# Patient Record
Sex: Female | Born: 1965 | Race: White | Hispanic: No | Smoking: Never smoker
Health system: Southern US, Community
[De-identification: ages and names within clinical notes are randomized; demographics above are authoritative.]

## PROBLEM LIST (undated history)

## (undated) DIAGNOSIS — I4892 Unspecified atrial flutter: Secondary | ICD-10-CM

## (undated) DIAGNOSIS — I4719 Other supraventricular tachycardia: Secondary | ICD-10-CM

## (undated) DIAGNOSIS — R739 Hyperglycemia, unspecified: Secondary | ICD-10-CM

## (undated) DIAGNOSIS — R079 Chest pain, unspecified: Secondary | ICD-10-CM

## (undated) DIAGNOSIS — R911 Solitary pulmonary nodule: Secondary | ICD-10-CM

## (undated) DIAGNOSIS — I499 Cardiac arrhythmia, unspecified: Secondary | ICD-10-CM

## (undated) DIAGNOSIS — I471 Supraventricular tachycardia: Secondary | ICD-10-CM

## (undated) DIAGNOSIS — E785 Hyperlipidemia, unspecified: Secondary | ICD-10-CM

## (undated) DIAGNOSIS — I491 Atrial premature depolarization: Secondary | ICD-10-CM

## (undated) DIAGNOSIS — I480 Paroxysmal atrial fibrillation: Secondary | ICD-10-CM

## (undated) HISTORY — DX: Hyperlipidemia, unspecified: E78.5

## (undated) HISTORY — PX: HERNIA REPAIR: SHX51

## (undated) HISTORY — PX: CHOLECYSTECTOMY: SHX55

## (undated) HISTORY — PX: APPENDECTOMY: SHX54

---

## 1998-03-10 ENCOUNTER — Other Ambulatory Visit: Admission: RE | Admit: 1998-03-10 | Discharge: 1998-03-10 | Payer: Self-pay | Admitting: Obstetrics and Gynecology

## 1998-05-29 ENCOUNTER — Other Ambulatory Visit: Admission: RE | Admit: 1998-05-29 | Discharge: 1998-05-29 | Payer: Self-pay | Admitting: Obstetrics and Gynecology

## 1999-03-05 ENCOUNTER — Other Ambulatory Visit: Admission: RE | Admit: 1999-03-05 | Discharge: 1999-03-05 | Payer: Self-pay | Admitting: Obstetrics and Gynecology

## 2000-04-29 ENCOUNTER — Inpatient Hospital Stay (HOSPITAL_COMMUNITY): Admission: AD | Admit: 2000-04-29 | Discharge: 2000-05-02 | Payer: Self-pay | Admitting: Obstetrics and Gynecology

## 2000-06-01 ENCOUNTER — Other Ambulatory Visit: Admission: RE | Admit: 2000-06-01 | Discharge: 2000-06-01 | Payer: Self-pay | Admitting: Obstetrics and Gynecology

## 2001-12-09 ENCOUNTER — Inpatient Hospital Stay (HOSPITAL_COMMUNITY): Admission: AD | Admit: 2001-12-09 | Discharge: 2001-12-09 | Payer: Self-pay | Admitting: Obstetrics and Gynecology

## 2001-12-27 ENCOUNTER — Inpatient Hospital Stay (HOSPITAL_COMMUNITY): Admission: AD | Admit: 2001-12-27 | Discharge: 2001-12-30 | Payer: Self-pay | Admitting: Obstetrics & Gynecology

## 2002-02-11 ENCOUNTER — Other Ambulatory Visit: Admission: RE | Admit: 2002-02-11 | Discharge: 2002-02-11 | Payer: Self-pay | Admitting: Obstetrics & Gynecology

## 2003-05-27 ENCOUNTER — Other Ambulatory Visit: Admission: RE | Admit: 2003-05-27 | Discharge: 2003-05-27 | Payer: Self-pay | Admitting: Obstetrics & Gynecology

## 2004-06-07 ENCOUNTER — Other Ambulatory Visit: Admission: RE | Admit: 2004-06-07 | Discharge: 2004-06-07 | Payer: Self-pay | Admitting: Obstetrics & Gynecology

## 2012-09-18 ENCOUNTER — Encounter: Payer: Self-pay | Admitting: Gastroenterology

## 2012-10-02 ENCOUNTER — Encounter: Payer: Self-pay | Admitting: Gastroenterology

## 2012-10-02 ENCOUNTER — Ambulatory Visit (INDEPENDENT_AMBULATORY_CARE_PROVIDER_SITE_OTHER): Payer: BC Managed Care – PPO | Admitting: Gastroenterology

## 2012-10-02 VITALS — BP 118/82 | HR 83 | Ht 65.5 in | Wt 246.0 lb

## 2012-10-02 DIAGNOSIS — Z800 Family history of malignant neoplasm of digestive organs: Secondary | ICD-10-CM

## 2012-10-02 MED ORDER — MOVIPREP 100 G PO SOLR: 1.0000 | Freq: Once | ORAL | Status: DC

## 2012-10-02 NOTE — Patient Instructions (Addendum)
You will be set up for a colonoscopy (LEC, moderate sedation) for FH of colon cancer.

## 2012-10-02 NOTE — Progress Notes (Signed)
HPI: This is a  very pleasant 47 year old woman whom I am meeting for the first time today.  Her mother was diagnosed with colon cancer at age 25.  Died quickly.  Constipation her whole life, improving lately.  Put on 80 pounds in 1.5 years.  She has had no overt GI bleeding.  Review of systems: Pertinent positive and negative review of systems were noted in the above HPI section. Complete review of systems was performed and was otherwise normal.    Past Medical History  Diagnosis Date  . Morbid obesity   . Palpitations   . Unspecified sleep apnea   . Hyperlipidemia   . Anxiety   . Sleep apnea   . Gall stones     Past Surgical History  Procedure Laterality Date  . Appendectomy    . Cholecystectomy    . Cholecystectomy    . Hernia repair      Current Outpatient Prescriptions  Medication Sig Dispense Refill  . EVENING PRIMROSE OIL PO Take 1 capsule by mouth daily.      . fish oil-omega-3 fatty acids 1000 MG capsule Take 2 g by mouth daily.       No current facility-administered medications for this visit.    Allergies as of 10/02/2012  . (Not on File)    Family History  Problem Relation Age of Onset  . Heart disease Father   . Colon cancer Mother   . Stroke Paternal Grandfather   . Rectal cancer Mother   . Breast cancer Sister   . Colon polyps Sister     x 2  . Stroke Paternal Grandmother   . Diabetes Paternal Grandfather   . Diabetes Paternal Grandmother     History   Social History  . Marital Status: Married    Spouse Name: N/A    Number of Children: 2  . Years of Education: N/A   Occupational History  .     Social History Main Topics  . Smoking status: Never Smoker   . Smokeless tobacco: Never Used  . Alcohol Use: Yes     Comment: rare  . Drug Use: No  . Sexually Active: Not on file   Other Topics Concern  . Not on file   Social History Narrative  . No narrative on file       Physical Exam: BP 118/82  Pulse 83  Ht 5' 5.5"  (1.664 m)  Wt 246 lb (111.585 kg)  BMI 40.3 kg/m2  LMP 10/01/2012 Constitutional: generally well-appearing Psychiatric: alert and oriented x3 Eyes: extraocular movements intact Mouth: oral pharynx moist, no lesions Neck: supple no lymphadenopathy Cardiovascular: heart regular rate and rhythm Lungs: clear to auscultation bilaterally Abdomen: soft, nontender, nondistended, no obvious ascites, no peritoneal signs, normal bowel sounds Extremities: no lower extremity edema bilaterally Skin: no lesions on visible extremities    Assessment and plan: 47 y.o. female with  significant family history of colon cancer, her mother had colon cancer in her 75s  She is at elevated risk for colon cancer and needs colon cancer screening with colonoscopy at her soonest convenience. She understands that even if it is normal, no polyps or cancers, she should probably have repeat colon cancer screening with a colonoscopy 5 years from now as well.

## 2012-10-31 ENCOUNTER — Telehealth: Payer: Self-pay | Admitting: Gastroenterology

## 2012-10-31 ENCOUNTER — Other Ambulatory Visit: Payer: BC Managed Care – PPO | Admitting: Gastroenterology

## 2012-11-01 NOTE — Telephone Encounter (Signed)
Please bill.

## 2012-11-01 NOTE — Telephone Encounter (Signed)
She no showed for her colonoscopy.  Should charge our fee.

## 2012-11-15 ENCOUNTER — Institutional Professional Consult (permissible substitution): Payer: Self-pay | Admitting: Cardiovascular Disease

## 2013-03-26 ENCOUNTER — Encounter (HOSPITAL_COMMUNITY): Payer: Self-pay | Admitting: *Deleted

## 2013-03-26 ENCOUNTER — Inpatient Hospital Stay (HOSPITAL_COMMUNITY)
Admission: AD | Admit: 2013-03-26 | Discharge: 2013-03-29 | DRG: 287 | Disposition: A | Discharge status: Home / Self Care | Payer: BC Managed Care – PPO | Source: Other Acute Inpatient Hospital | Attending: Cardiology | Admitting: Cardiology

## 2013-03-26 DIAGNOSIS — Z800 Family history of malignant neoplasm of digestive organs: Secondary | ICD-10-CM

## 2013-03-26 DIAGNOSIS — Z6833 Body mass index (BMI) 33.0-33.9, adult: Secondary | ICD-10-CM

## 2013-03-26 DIAGNOSIS — Z23 Encounter for immunization: Secondary | ICD-10-CM

## 2013-03-26 DIAGNOSIS — I498 Other specified cardiac arrhythmias: Secondary | ICD-10-CM | POA: Diagnosis present

## 2013-03-26 DIAGNOSIS — Z9089 Acquired absence of other organs: Secondary | ICD-10-CM

## 2013-03-26 DIAGNOSIS — Z8371 Family history of colonic polyps: Secondary | ICD-10-CM

## 2013-03-26 DIAGNOSIS — I369 Nonrheumatic tricuspid valve disorder, unspecified: Secondary | ICD-10-CM

## 2013-03-26 DIAGNOSIS — R079 Chest pain, unspecified: Secondary | ICD-10-CM

## 2013-03-26 DIAGNOSIS — Z823 Family history of stroke: Secondary | ICD-10-CM

## 2013-03-26 DIAGNOSIS — I480 Paroxysmal atrial fibrillation: Secondary | ICD-10-CM | POA: Diagnosis present

## 2013-03-26 DIAGNOSIS — I491 Atrial premature depolarization: Secondary | ICD-10-CM | POA: Diagnosis present

## 2013-03-26 DIAGNOSIS — R739 Hyperglycemia, unspecified: Secondary | ICD-10-CM | POA: Diagnosis present

## 2013-03-26 DIAGNOSIS — Z833 Family history of diabetes mellitus: Secondary | ICD-10-CM

## 2013-03-26 DIAGNOSIS — H538 Other visual disturbances: Secondary | ICD-10-CM | POA: Diagnosis present

## 2013-03-26 DIAGNOSIS — I4891 Unspecified atrial fibrillation: Principal | ICD-10-CM | POA: Diagnosis present

## 2013-03-26 DIAGNOSIS — R001 Bradycardia, unspecified: Secondary | ICD-10-CM | POA: Diagnosis present

## 2013-03-26 DIAGNOSIS — Z803 Family history of malignant neoplasm of breast: Secondary | ICD-10-CM

## 2013-03-26 DIAGNOSIS — F411 Generalized anxiety disorder: Secondary | ICD-10-CM | POA: Diagnosis present

## 2013-03-26 DIAGNOSIS — E669 Obesity, unspecified: Secondary | ICD-10-CM | POA: Diagnosis present

## 2013-03-26 DIAGNOSIS — Z79899 Other long term (current) drug therapy: Secondary | ICD-10-CM

## 2013-03-26 DIAGNOSIS — R7309 Other abnormal glucose: Secondary | ICD-10-CM | POA: Diagnosis present

## 2013-03-26 DIAGNOSIS — R911 Solitary pulmonary nodule: Secondary | ICD-10-CM | POA: Diagnosis present

## 2013-03-26 DIAGNOSIS — E785 Hyperlipidemia, unspecified: Secondary | ICD-10-CM | POA: Diagnosis present

## 2013-03-26 DIAGNOSIS — Z83719 Family history of colon polyps, unspecified: Secondary | ICD-10-CM

## 2013-03-26 DIAGNOSIS — I471 Supraventricular tachycardia, unspecified: Secondary | ICD-10-CM | POA: Diagnosis not present

## 2013-03-26 DIAGNOSIS — I4892 Unspecified atrial flutter: Secondary | ICD-10-CM | POA: Diagnosis present

## 2013-03-26 DIAGNOSIS — R0789 Other chest pain: Secondary | ICD-10-CM | POA: Diagnosis present

## 2013-03-26 DIAGNOSIS — Z8249 Family history of ischemic heart disease and other diseases of the circulatory system: Secondary | ICD-10-CM

## 2013-03-26 HISTORY — DX: Atrial premature depolarization: I49.1

## 2013-03-26 HISTORY — DX: Hyperglycemia, unspecified: R73.9

## 2013-03-26 HISTORY — DX: Paroxysmal atrial fibrillation: I48.0

## 2013-03-26 HISTORY — DX: Unspecified atrial flutter: I48.92

## 2013-03-26 HISTORY — DX: Chest pain, unspecified: R07.9

## 2013-03-26 HISTORY — DX: Other supraventricular tachycardia: I47.19

## 2013-03-26 HISTORY — DX: Solitary pulmonary nodule: R91.1

## 2013-03-26 HISTORY — DX: Obesity, unspecified: E66.9

## 2013-03-26 HISTORY — DX: Supraventricular tachycardia: I47.1

## 2013-03-26 LAB — HEPARIN LEVEL (UNFRACTIONATED): Heparin Unfractionated: 0.33 IU/mL (ref 0.30–0.70)

## 2013-03-26 LAB — BASIC METABOLIC PANEL
BUN: 10 mg/dL (ref 6–23)
CO2: 25 mEq/L (ref 19–32)
Calcium: 8.9 mg/dL (ref 8.4–10.5)
Chloride: 106 mEq/L (ref 96–112)
GFR calc non Af Amer: 76 mL/min — ABNORMAL LOW (ref >90)
Glucose, Bld: 91 mg/dL (ref 70–99)
Sodium: 139 mEq/L (ref 135–145)

## 2013-03-26 LAB — HEMOGLOBIN A1C: Mean Plasma Glucose: 120 mg/dL — ABNORMAL HIGH (ref <117)

## 2013-03-26 LAB — MAGNESIUM: Magnesium: 2.1 mg/dL (ref 1.5–2.5)

## 2013-03-26 LAB — TROPONIN I: Troponin I: <0.3 ng/mL (ref <0.30)

## 2013-03-26 MED ORDER — HEPARIN (PORCINE) IN NACL 100-0.45 UNIT/ML-% IJ SOLN
1400.0000 [IU]/h | INTRAMUSCULAR | Status: DC
  Administered 2013-03-26: 1100 [IU]/h via INTRAVENOUS
  Administered 2013-03-27 – 2013-03-28 (×3): 1400 [IU]/h via INTRAVENOUS
  Filled 2013-03-26 (×6): qty 250

## 2013-03-26 MED ORDER — ASPIRIN EC 81 MG PO TBEC
81.0000 mg | DELAYED_RELEASE_TABLET | Freq: Every day | ORAL | Status: DC
  Administered 2013-03-26 – 2013-03-29 (×4): 81 mg via ORAL
  Filled 2013-03-26 (×4): qty 1

## 2013-03-26 MED ORDER — NITROGLYCERIN 0.4 MG SL SUBL
0.4000 mg | SUBLINGUAL_TABLET | SUBLINGUAL | Status: DC | PRN
  Filled 2013-03-26: qty 25

## 2013-03-26 MED ORDER — SODIUM CHLORIDE 0.9 % IV SOLN: 250.0000 mL | INTRAVENOUS | Status: DC | PRN

## 2013-03-26 MED ORDER — HEPARIN BOLUS VIA INFUSION
4000.0000 [IU] | Freq: Once | INTRAVENOUS | Status: AC
  Administered 2013-03-26: 4000 [IU] via INTRAVENOUS
  Filled 2013-03-26: qty 4000

## 2013-03-26 MED ORDER — METOPROLOL TARTRATE 25 MG PO TABS
25.0000 mg | ORAL_TABLET | Freq: Two times a day (BID) | ORAL | Status: DC
Start: 2013-03-26 — End: 2013-03-26

## 2013-03-26 MED ORDER — PANTOPRAZOLE SODIUM 40 MG PO TBEC
40.0000 mg | DELAYED_RELEASE_TABLET | Freq: Every day | ORAL | Status: DC
  Administered 2013-03-26 – 2013-03-29 (×4): 40 mg via ORAL
  Filled 2013-03-26 (×4): qty 1

## 2013-03-26 MED ORDER — ONDANSETRON HCL 4 MG/2ML IJ SOLN
4.0000 mg | Freq: Four times a day (QID) | INTRAMUSCULAR | Status: DC | PRN
  Administered 2013-03-27: 4 mg via INTRAVENOUS
  Filled 2013-03-26: qty 2

## 2013-03-26 MED ORDER — SODIUM CHLORIDE 0.9 % IJ SOLN
3.0000 mL | Freq: Two times a day (BID) | INTRAMUSCULAR | Status: DC
  Administered 2013-03-26 – 2013-03-28 (×4): 3 mL via INTRAVENOUS

## 2013-03-26 MED ORDER — METOPROLOL SUCCINATE ER 25 MG PO TB24: 25.0000 mg | ORAL_TABLET | Freq: Two times a day (BID) | ORAL | Status: DC

## 2013-03-26 MED ORDER — ACETAMINOPHEN 325 MG PO TABS: 650.0000 mg | ORAL_TABLET | ORAL | Status: DC | PRN

## 2013-03-26 MED ORDER — SODIUM CHLORIDE 0.9 % IJ SOLN: 3.0000 mL | INTRAMUSCULAR | Status: DC | PRN

## 2013-03-26 MED ORDER — METOPROLOL SUCCINATE ER 25 MG PO TB24
25.0000 mg | ORAL_TABLET | Freq: Two times a day (BID) | ORAL | Status: DC
  Administered 2013-03-26 (×2): 25 mg via ORAL
  Filled 2013-03-26 (×5): qty 1

## 2013-03-26 NOTE — Progress Notes (Signed)
ANTICOAGULATION CONSULT NOTE - Follow Up Consult  Pharmacy Consult for heparin Indication: chest pain/ACS  No Known Allergies  Patient Measurements: Height: 5\' 5"  (165.1 cm) Weight: 200 lb (90.719 kg) IBW/kg (Calculated) : 57 Heparin Dosing Weight: 80 kg  Vital Signs: Temp: 98.1 F (36.7 C) (12/09 1619) Temp src: Oral (12/09 1619) BP: 93/33 mmHg (12/09 1619) Pulse Rate: 54 (12/09 1619)  Labs:  Recent Labs  03/26/13 1315  CREATININE 0.89  TROPONINI <0.30    Estimated Creatinine Clearance: 87 ml/min (by C-G formula based on Cr of 0.89).   Medications:  Scheduled:  . aspirin EC  81 mg Oral Daily  . metoprolol succinate  25 mg Oral BID  . pantoprazole  40 mg Oral Daily  . sodium chloride  3 mL Intravenous Q12H   Infusions:  . heparin 1,100 Units/hr (03/26/13 1432)    Assessment: 47 yo female with ACS is currently on therapeutic heparin.  Heparin level is 0.33. Goal of Therapy:  Heparin level 0.3-0.7 units/ml Monitor platelets by anticoagulation protocol: Yes   Plan:  1) Continue heparin at 1100 units/hr. Reconfirm with am lab. 2) Daily heparin level and CBC  Reneta Niehaus, Tsz-Yin 03/26/2013,7:08 PM

## 2013-03-26 NOTE — Progress Notes (Signed)
Echocardiogram 2D Echocardiogram has been performed.  Sandra Stark 03/26/2013, 2:44 PM

## 2013-03-26 NOTE — Progress Notes (Signed)
ANTICOAGULATION CONSULT NOTE - Initial Consult  Pharmacy Consult for Heparin Indication: chest pain/ACS  No Known Allergies  Patient Measurements: Height: 5\' 5"  (165.1 cm) Weight: 200 lb (90.719 kg) IBW/kg (Calculated) : 57 Heparin Dosing Weight: ~ 80 kg  Vital Signs: Temp: 97.8 F (36.6 C) (12/09 1057) Temp src: Oral (12/09 1057) BP: 131/56 mmHg (12/09 1200) Pulse Rate: 82 (12/09 1200)  Labs: No results found for this basename: HGB, HCT, PLT, APTT, LABPROT, INR, HEPARINUNFRC, CREATININE, CKTOTAL, CKMB, TROPONINI,  in the last 72 hours  CrCl is unknown because no creatinine reading has been taken.   Medical History: Past Medical History  Diagnosis Date  . Palpitations     a. Reported to PCP 09/2012 but ECG reportedly OK.  Marland Kitchen Hyperlipidemia     a. Monitored by PCP - elected to pursue lifestyle modification in 09/2012 with reassessment pending.    Medications:  Scheduled:  . aspirin EC  81 mg Oral Daily  . metoprolol succinate  25 mg Oral BID  . pantoprazole  40 mg Oral Daily  . sodium chloride  3 mL Intravenous Q12H    Assessment: 47 yo female admitted with CP, palpitations, and atrial flutter.  Pharmacy asked to begin anticoagulation with IV heparin.  Spoke with patient, no history of bleeding or blood thinners PTA.  Labs from [place]: PT/INR 10.2/1; PTT 22.9; WBC 10.4, Hgb 13.3, Hct 38.5; Pltc 198; Scr 0.9.  Goal of Therapy:  Heparin level 0.3-0.7 units/ml Monitor platelets by anticoagulation protocol: Yes   Plan:  1. Give IV heparin 4000 units x 1 now. 2. Then start heparin gtt at 1100 units/hr. 3. Check heparin level 6 hrs after gtt starts. 4. Daily heparin level and CBC.  Tad Moore, BCPS  Clinical Pharmacist Pager [phone]  03/26/2013 1:52 PM

## 2013-03-26 NOTE — H&P (Signed)
History and Physical  Patient ID: Sandra Stark MRN: [id number], DOB: 06-09-1965 Date of Encounter: 03/26/2013, 12:41 PM Primary Physician: Feliciana Rossetti, MD Primary Cardiologist:  New to [facility] (sister-in-law sees Dr. Gala Romney)  Chief Complaint: CP, palpitations Reason for Admission: CP, ectopy, atrial flutter  HPI: Sandra Stark is a 47 y/o F with history of hyperlipidemia & palpitations but no formal cardiac diagnoses who presented to [facility] today with CP and palpitations. She saw her PCP in 09/2012 c/o rare palpitations - she states an EKG was done that did not show anything, but a lab level showed she might have mild fluid from her heart so they were going to keep an eye on it. Her cholesterol was also reportedly elevated but she elected a trial of lifestyle modifications prior to statin initiation. She denies prior echo or cath. For the past 2-3 days she has not felt well. She has felt more fatigued with intermittent palpitations. She initially attributed this to being busy with work, but yesterday evening developed CP. This began around 10:30pm at a 3/10 as a tightness in her chest with intermittent radiation to her back. She also felt right arm soreness and heaviness. She had occasional palpitations that were short lived, but other episodes that were somewhat longer. With the longer episodes she felt a sense of anxiety and tingling all over. When the palpitations occurred, she would feel an increase in chest heaviness. Otherwise nothing made the pain worse. She also has had constant chest pain in absence of palpitations. She endorses nausea and sensation of not being able to take a deep breath. No vomiting, syncope, LEE, orthopnea. Due to persistence of symptoms she called 911. EMS strips show NSR with frequent PACs with abberrancy (vs PVCs). Per ER notes, EMS also reportedly saw SVT. While in the ER the patient developed tachycardia with HR into the 150's and EKG showed what appears to be  atrial flutter. The patient states she received 2 injections - the first vial did nothing, the 2nd vial bottomed HR out transiently to 38-40 per her report. The ED notes indicate it was Lopressor that she received. On arrival to [facility] she still feels the 3/10 discomfort and is having occasional ectopy/bigeminy on telemetry. She also received 3 SL NTG for her CP - the first made her feel "horrible with increased CP to 7/10"; subsequent NTGs brought her pain back down to baseline level of 3/10. She also reports intermittent blurry/fuzzy vision for the last several days, lasting minutes at a time several times a day - no visual field loss, spots/flashes of light, facial asymmetry, numbness, weakness, slurred speech, or any other focal neurologic abnormalities except a sense that she just hasn't been able to focus this last few days. She does not exercise but denies any recent exertional CP or SOB.  At [facility], she also received 324mg  ASA, morphine, and Zofran. Troponins at 0100 and 0320 were normal, d-dimer/pBNP/TSH/free T4 all normal. CT angio showed no evidence of PE; thoracic aorta was WNL; did show 6mm RML nodule. Labs otherwise unremarkable except glucose 110. VSS.  Past Medical History  Diagnosis Date  . Palpitations     a. Reported to PCP 09/2012 but ECG reportedly OK.  Marland Kitchen Hyperlipidemia     a. Monitored by PCP - elected to pursue lifestyle modification in 09/2012 with reassessment pending.     Most Recent Cardiac Studies: None   Surgical History:  Past Surgical History  Procedure Laterality Date  . Appendectomy    .  Cholecystectomy    . Hernia repair       Home Meds: Prior to Admission medications   Medication Sig Start Date End Date Taking? Authorizing Provider  EVENING PRIMROSE OIL PO Take 1 capsule by mouth daily.   Yes Historical Provider, MD  fish oil-omega-3 fatty acids 1000 MG capsule Take 2 g by mouth daily.   Yes Historical Provider, MD    Allergies: No Known  Allergies  History   Social History  . Marital Status: Married    Spouse Name: N/A    Number of Children: 2  . Years of Education: N/A   Occupational History  .      Adminstrative assistance   Social History Main Topics  . Smoking status: Never Smoker   . Smokeless tobacco: Never Used  . Alcohol Use: Yes     Comment: rare - 2x/month  . Drug Use: No  . Sexual Activity: Yes   Other Topics Concern  . Not on file   Social History Narrative  . No narrative on file     Family History  Problem Relation Age of Onset  . Heart disease      several family members on dad's side have had heart issues - earliest in their 10's  . Colon cancer Mother   . Stroke Paternal Grandfather   . Rectal cancer Mother   . Breast cancer Sister   . Colon polyps Sister     x 2  . Stroke Paternal Grandmother   . Diabetes Paternal Grandfather   . Diabetes Paternal Grandmother     Review of Systems: General: negative for chills, fever, night sweats or weight changes.  Cardiovascular: see above Dermatological: negative for rash Respiratory: negative for cough or wheezing Urologic: negative for hematuria Abdominal: negative for vomiting, diarrhea, bright red blood per rectum, melena, or hematemesis Neurologic: negative for syncope or dizziness All other systems reviewed and are otherwise negative except as noted above.  Labs:  WBC 10.4, Hgb 13.3, Hct 38.5, Plt 198 Na 137, K 3.7, Cl 102, CO2 22, BUN 14, Cr 0.90, glu 110 Ca 8.7, albumin 4,0, Tprot 7.2,  PT 10.2, INR 1.0, APTT 22.9 Ddimer WNL <100 (ref range <400) CK/troponin/myoglobin WNL x 2 (0100, 0340) pBNP 69 (wnl) Tbili 0.7, AST 29, ALT 25, AP 91 TSH 4.55, free T4 1.09, free T3 4.23 (all wnl) Urine Hcg neg  Radiology/Studies:  CXR: no acute cardiopulmonary process seen CT angio: no PE nor acute CP process. Thoracic aorta is normal in course and caliber, unremarkable. No pneuomthorax. thracheobronchial tree is patent and midline.  3mm RML pulm nodule- if high risk for bronchogenic carcinoma, f/u CT 1 yr. If low risk, no f/u needed.    EKG:  03/26/13 0:52: NSR 92 bpm occasional PACs with aberrancy no acute ST-T changes (some tracings show bigeminy) 03/26/13 3:18 - atrial flutter RVR 154bpm with nonspecific STT changes 03/26/13 3:21: NSR 82bpm with ? Possible extremely minimal ST depression II, V5-V6  Physical Exam: Blood pressure 131/56, pulse 82, temperature 97.8 F (36.6 C), temperature source Oral, resp. rate 15, height 5\' 5"  (1.651 m), weight 200 lb (90.719 kg), last menstrual period 03/02/2013, SpO2 100.00%. General: Well developed, well nourished overweight WF in no acute distress. Head: Normocephalic, atraumatic, sclera non-icteric, no xanthomas, nares are without discharge.  Neck: Negative for carotid bruits. JVD not elevated. Lungs: Clear bilaterally to auscultation without wheezes, rales, or rhonchi. Breathing is unlabored. Heart: RRR with S1 S2. No murmurs, rubs, or gallops appreciated.  Abdomen: Soft, non-tender, non-distended with normoactive bowel sounds. No hepatomegaly. No rebound/guarding. No obvious abdominal masses. Msk:  Strength and tone appear normal for age. Extremities: No clubbing or cyanosis. No edema.  Distal pedal pulses are 2+ and equal bilaterally. Neuro: Alert and oriented X 3. No focal deficit. No facial asymmetry. Moves all extremities spontaneously. Psych:  Responds to questions appropriately with a normal affect.    ASSESSMENT AND PLAN:  1. Chest pain - occurs even in absence of palpitations, with typical/atypical features. Negative enzymes at [place]. No PE or thoracic dissection by CTA. Will admit, cycle enzymes, heparin per pharmacy for now given atrial flutter and CP until she rules out. Nuc tomorrow if rules out. Add PPI. Continue ASA. Add BB as below. 2. Palpitations due to atrial flutter and atrial ectopy - she has had intermittent palpitations since the summer. See below for  comprehensive thoughts. Check 2D echo. Add Toprol XL 25mg  BID. TFTs wnl at [place]. 3. Blurry vision, intermittent - at this time we wonder if BP is dropping due to paroxysms of arrhythmia. Neuro exam nonfocal. If this persists, would recommend neuro eval.  4. Hyperlipidemia - check lipids in AM and add statin if appropriate.  5. Obesity BMI 33.3 - would encourage healthy diet/lifestyle. 6. Mild hyperglycemia (glu 110) - check A1C. 7. RML nodule - f/u PCP as outpatient.  Signed, Ronie Spies PA-C 03/26/2013, 12:41 PM  Patient seen with PA, agree with the above note.   1. Palpitations: Patient has had periods of heart racing since this summer. They have been worse over the last 2 wks (more often, lasting longer).  Chest pain and blurry vision have been associated with episodes of heart racing. In the ER today at [facility], she was noted to have atrial flutter (somewhat atypical-appearing flutter waves - ECG in shadow chart) with rate in the 150s.  She also had bigeminal premature beats, suspect PACs with aberrancy.  I think that her symptoms likely correlate with atrial flutter episodes.  She is now back in NSR. TSH and BNP normal at [place].  - Will start Toprol XL 25 mg bid - Needs echo.  - Will have EP see patient, suspect flutter ablation would be beneficial.  2. Chest pain: On and off x 1 week.  CTA chest negative for PE or dissection.  1st set cardiac enzymes at [facility] negative.  Still with 3/10 chest pain. ECG currently non-acute.  Prior chest pain episodes associated with palpitations.  Will rule out MI with serial troponins.  She will get ASA and will be on heparin gtt for now.  If she rules out, would get Cardiolite tomorrow morning.    3. Blurry vision episodes: Not really focal.  Possible that this is related to a fall in BP with atrial flutter/RVR episodes.   Marca Ancona 03/26/2013 1:36 PM

## 2013-03-27 ENCOUNTER — Observation Stay (HOSPITAL_COMMUNITY): Payer: BC Managed Care – PPO

## 2013-03-27 DIAGNOSIS — R079 Chest pain, unspecified: Secondary | ICD-10-CM

## 2013-03-27 LAB — BASIC METABOLIC PANEL WITH GFR
BUN: 10 mg/dL (ref 6–23)
CO2: 26 meq/L (ref 19–32)
Calcium: 8.4 mg/dL (ref 8.4–10.5)
Chloride: 104 meq/L (ref 96–112)
Creatinine, Ser: 1.05 mg/dL (ref 0.50–1.10)
GFR calc Af Amer: 72 mL/min — ABNORMAL LOW
GFR calc non Af Amer: 62 mL/min — ABNORMAL LOW
Glucose, Bld: 89 mg/dL (ref 70–99)
Potassium: 4.1 meq/L (ref 3.5–5.1)
Sodium: 139 meq/L (ref 135–145)

## 2013-03-27 LAB — LIPID PANEL
Cholesterol: 169 mg/dL (ref 0–200)
HDL: 35 mg/dL — ABNORMAL LOW (ref >39)
LDL Cholesterol: 98 mg/dL (ref 0–99)
Triglycerides: 178 mg/dL — ABNORMAL HIGH (ref <150)
VLDL: 36 mg/dL (ref 0–40)

## 2013-03-27 LAB — CBC
HCT: 35.7 % — ABNORMAL LOW (ref 36.0–46.0)
MCV: 93.5 fL (ref 78.0–100.0)
Platelets: 192 10*3/uL (ref 150–400)
RBC: 3.82 MIL/uL — ABNORMAL LOW (ref 3.87–5.11)
WBC: 8.8 10*3/uL (ref 4.0–10.5)

## 2013-03-27 LAB — HEPARIN LEVEL (UNFRACTIONATED)
Heparin Unfractionated: 0.17 [IU]/mL — ABNORMAL LOW (ref 0.30–0.70)
Heparin Unfractionated: 0.33 IU/mL (ref 0.30–0.70)

## 2013-03-27 MED ORDER — HEPARIN BOLUS VIA INFUSION
2000.0000 [IU] | Freq: Once | INTRAVENOUS | Status: AC
  Administered 2013-03-27: 2000 [IU] via INTRAVENOUS
  Filled 2013-03-27: qty 2000

## 2013-03-27 MED ORDER — TECHNETIUM TC 99M SESTAMIBI GENERIC - CARDIOLITE
30.0000 | Freq: Once | INTRAVENOUS | Status: AC | PRN
  Administered 2013-03-27: 30 via INTRAVENOUS

## 2013-03-27 MED ORDER — ATROPINE SULFATE 1 MG/ML IJ SOLN
INTRAMUSCULAR | Status: AC
  Filled 2013-03-27: qty 1

## 2013-03-27 MED ORDER — REGADENOSON 0.4 MG/5ML IV SOLN
INTRAVENOUS | Status: AC
  Filled 2013-03-27: qty 5

## 2013-03-27 MED ORDER — ASPIRIN 81 MG PO CHEW
81.0000 mg | CHEWABLE_TABLET | ORAL | Status: AC
  Administered 2013-03-28: 81 mg via ORAL
  Filled 2013-03-27: qty 1

## 2013-03-27 MED ORDER — INFLUENZA VAC SPLIT QUAD 0.5 ML IM SUSP
0.5000 mL | INTRAMUSCULAR | Status: DC
  Filled 2013-03-27: qty 0.5

## 2013-03-27 MED ORDER — TECHNETIUM TC 99M SESTAMIBI GENERIC - CARDIOLITE
10.0000 | Freq: Once | INTRAVENOUS | Status: AC | PRN
  Administered 2013-03-27: 10 via INTRAVENOUS

## 2013-03-27 MED ORDER — ATORVASTATIN CALCIUM 40 MG PO TABS
40.0000 mg | ORAL_TABLET | Freq: Every day | ORAL | Status: DC
  Administered 2013-03-27 – 2013-03-28 (×2): 40 mg via ORAL
  Filled 2013-03-27 (×4): qty 1

## 2013-03-27 MED ORDER — SODIUM CHLORIDE 0.9 % IV SOLN
1.0000 mL/kg/h | INTRAVENOUS | Status: DC
  Administered 2013-03-28: 1 mL/kg/h via INTRAVENOUS

## 2013-03-27 NOTE — Progress Notes (Signed)
Nuc results noted. D/w Dr. Ladona Ridgel who is on his way to talk to the patient. LHC scheduled for tomorrow per our discussion. Dayna Dunn PA-C

## 2013-03-27 NOTE — Progress Notes (Signed)
ANTICOAGULATION CONSULT NOTE - Follow Up Consult  Pharmacy Consult for heparin Indication: chest pain/ACS  Labs:  Recent Labs  03/26/13 1315 03/26/13 2000 03/26/13 2024 03/27/13 0033 03/27/13 0336  HGB  --   --   --   --  12.2  HCT  --   --   --   --  35.7*  PLT  --   --   --   --  192  HEPARINUNFRC  --   --  0.33  --  0.17*  CREATININE 0.89  --   --   --  1.05  TROPONINI <0.30 <0.30  --  <0.30  --     Assessment: 47yo female now subtherapeutic on heparin after one level at low end of goal, no gtt issues overnight.  Goal of Therapy:  Heparin level 0.3-0.7 units/ml   Plan:  Will rebolus with heparin 2000 units x1 and increase gtt by 3 units/kg/hr to 1400 units/hr and check level in 6hr.  Vernard Gambles, PharmD, BCPS  03/27/2013,5:32 AM

## 2013-03-27 NOTE — Progress Notes (Signed)
Stress cardiolite performed. Pt's HR was initially in the low-mid 40's while down in nuc. BP was stable. She was c/o the same low grade chest pressure she's felt since admission but no acute evolving sx. She did get Toprol XL yesterday afternoon and evening but today's dose was held. With sitting up, HR went to the 80s and she was able to exercise with good exercise tolerance on the treadmill. Chest pressure went away with midlevel exercise and returned at the very peak of exercise. Reached target HR of 147, peak 150. No arrhythmias with exercise or acute EKG changes with exercise. About 4 minutes into recovery she had brief 4-7 beat runs of nonsustained atrial tachycardia along with PACs and blocked PACs in bigeminal pattern. HR varied from 40s-100s. She did feel palpitations and the same low grade chest pressure from admission. Enzymes negative thus far. I discontinued beta blocker until we have input from EP on their recommendations. Nuc images pending.  Mckenize Mezera PA-C

## 2013-03-27 NOTE — Progress Notes (Signed)
Pt noted to have a drop in heart rate into the 30's and symptomatic. Pt c/o mid sternal CP 4/5 out of 10, and nausea. 12 lead EKG performed. Yousef, MD notified of pt HR in 30's, symptomatic complaints, and nausea. No new orders at this time per MD. Pt administered Zofran per PRN order. Will continue to monitor.

## 2013-03-27 NOTE — Consult Note (Signed)
 ELECTROPHYSIOLOGY CONSULT NOTE    Patient ID: Sandra Stark MRN: [id number], DOB/AGE: 08/28/1965 47 y.o.  Admit date: 03/26/2013 Date of Consult: 03-27-2013  Primary Physician: GRISSO,GREG, MD Primary Cardiologist: new to [facility]  Reason for Consultation: atrial arrhythmias  HPI:  Sandra Stark is a 47 year old female with a past medical history notable for hyperlipidemia and palpitations.  She presented to [facility] yesterday with palpitations and was found to be in atypical atrial flutter.  She was symptomatic with chest pain and was transferred to [facility] for further evaluation.    She has had palpitations on and off for the last several months.  These are increasing in frequency and duration.  They usually last for a few minutes at a time and are self terminating.  They have been increasingly worse over the last 3 days.   Lab work this admission is notable for negative cardiac enzymes, normal TSH (at [place]), normal electrolytes.  She had a CT scan of her chest at [place] which showed no evidence of PE, no aortic dissection, and 6mm RML nodule.  Echocardiogram yesterday demonstrated an EF of 60% with no RWMA, PA pressure of 38, LA 30.  She is pending myoview this morning to rule out coronary disease.  She has been placed on Metoprolol twice daily this admission.  She is married with 2 children and works as an administrative assistant. She does not smoke, drinks alcohol 1-2 times/month and denies recreational drug use.  She is active and not functionally limited. She does report intermittent lower extremity edema for several years. She denies shortness of breath or frank syncope.  She states that she has "lightheaded" feelings during some of her palpitations.   EP has been asked to evaluate for treatment options.   ROS is negative except as outlined above.   Past Medical History  Diagnosis Date  . Palpitations     a. Reported to PCP 09/2012 but ECG reportedly OK.    . Hyperlipidemia     a. Monitored by PCP - elected to pursue lifestyle modification in 09/2012 with reassessment pending.     Surgical History:  Past Surgical History  Procedure Laterality Date  . Appendectomy    . Cholecystectomy    . Hernia repair       Prescriptions prior to admission  Medication Sig Dispense Refill  . aspirin 325 MG tablet Take 325 mg by mouth once.        Inpatient Medications:  . aspirin EC  81 mg Oral Daily  . metoprolol succinate  25 mg Oral BID  . pantoprazole  40 mg Oral Daily  . sodium chloride  3 mL Intravenous Q12H    Allergies: No Known Allergies  History   Social History  . Marital Status: Married    Spouse Name: N/A    Number of Children: 2  . Years of Education: N/A   Occupational History  .      Adminstrative assistance   Social History Main Topics  . Smoking status: Never Smoker   . Smokeless tobacco: Never Used  . Alcohol Use: Yes     Comment: rare - 2x/month  . Drug Use: No  . Sexual Activity: Yes   Other Topics Concern  . Not on file   Social History Narrative  . No narrative on file     Family History  Problem Relation Age of Onset  . Heart disease      several family members on dad's side   have had heart issues - earliest in their 50's  . Colon cancer Mother   . Stroke Paternal Grandfather   . Rectal cancer Mother   . Breast cancer Sister   . Colon polyps Sister     x 2  . Stroke Paternal Grandmother   . Diabetes Paternal Grandfather   . Diabetes Paternal Grandmother       Labs:   Lab Results  Component Value Date   WBC 8.8 03/27/2013   HGB 12.2 03/27/2013   HCT 35.7* 03/27/2013   MCV 93.5 03/27/2013   PLT 192 03/27/2013    Recent Labs Lab 03/27/13 0336  NA 139  K 4.1  CL 104  CO2 26  BUN 10  CREATININE 1.05  CALCIUM 8.4  GLUCOSE 89   Lab Results  Component Value Date   TROPONINI <0.30 03/27/2013   Lab Results  Component Value Date   CHOL 169 03/27/2013   Lab Results  Component  Value Date   HDL 35* 03/27/2013   Lab Results  Component Value Date   LDLCALC 98 03/27/2013   Lab Results  Component Value Date   TRIG 178* 03/27/2013   Lab Results  Component Value Date   CHOLHDL 4.8 03/27/2013   No results found for this basename: LDLDIRECT    No results found for this basename: DDIMER     Radiology/Studies: No results found.  EKG: 03-26-13 03:18 atypical atrial flutter, ventricular rate 154, QRS 102  TELEMETRY: sinus bradycardia with runs of 1:1 atrial tachycardia    EP Attending  Patient seen and examined. Agree with above. I saw the patient on 03/27/2013 with Amber Seiler as above. She has atrial tachycardia, atrial flutter and atrial fibrillation. She has an abnormal stress test and chest pain. As we would like to give her flecainide but cannot if she has obstructive CAD, will plan for left heart cath.  Ester Mabe,M.D. 

## 2013-03-27 NOTE — Progress Notes (Signed)
ANTICOAGULATION CONSULT NOTE - Follow Up Consult  Pharmacy Consult for heparin Indication: chest pain/ACS  Labs:  Recent Labs  03/26/13 1315 03/26/13 2000 03/26/13 2024 03/27/13 0033 03/27/13 0336 03/27/13 1645  HGB  --   --   --   --  12.2  --   HCT  --   --   --   --  35.7*  --   PLT  --   --   --   --  192  --   HEPARINUNFRC  --   --  0.33  --  0.17* 0.33  CREATININE 0.89  --   --   --  1.05  --   TROPONINI <0.30 <0.30  --  <0.30  --   --     Assessment: 47yo female now therapeutic on heparin  Goal of Therapy:  Heparin level 0.3-0.7 units/ml   Plan:  Continue heparin at 1400 units / hr Follow up am labs  Thank you. Okey Regal, PharmD [phone]  03/27/2013,5:35 PM

## 2013-03-28 ENCOUNTER — Encounter (HOSPITAL_COMMUNITY)
Admission: AD | Disposition: A | Discharge status: Home / Self Care | Payer: Self-pay | Source: Other Acute Inpatient Hospital | Attending: Cardiology

## 2013-03-28 DIAGNOSIS — R9430 Abnormal result of cardiovascular function study, unspecified: Secondary | ICD-10-CM

## 2013-03-28 HISTORY — PX: LEFT HEART CATHETERIZATION WITH CORONARY ANGIOGRAM: SHX5451

## 2013-03-28 LAB — POCT I-STAT 3, ART BLOOD GAS (G3+)
Acid-base deficit: 5 mmol/L — ABNORMAL HIGH (ref 0.0–2.0)
Bicarbonate: 21.1 mEq/L (ref 20.0–24.0)
O2 Saturation: 100 %
TCO2: 22 mmol/L (ref 0–100)
pO2, Arterial: 225 mmHg — ABNORMAL HIGH (ref 80.0–100.0)

## 2013-03-28 LAB — CBC
Hemoglobin: 13.1 g/dL (ref 12.0–15.0)
MCH: 31.1 pg (ref 26.0–34.0)
MCHC: 33.4 g/dL (ref 30.0–36.0)
MCV: 93.1 fL (ref 78.0–100.0)
Platelets: 216 10*3/uL (ref 150–400)

## 2013-03-28 LAB — PREGNANCY, URINE: Preg Test, Ur: NEGATIVE

## 2013-03-28 LAB — PROTIME-INR: INR: 0.94 (ref 0.00–1.49)

## 2013-03-28 LAB — HEPARIN LEVEL (UNFRACTIONATED): Heparin Unfractionated: 0.46 IU/mL (ref 0.30–0.70)

## 2013-03-28 SURGERY — LEFT HEART CATHETERIZATION WITH CORONARY ANGIOGRAM
Anesthesia: LOCAL

## 2013-03-28 MED ORDER — LIDOCAINE HCL (PF) 1 % IJ SOLN
INTRAMUSCULAR | Status: AC
  Filled 2013-03-28: qty 30

## 2013-03-28 MED ORDER — SODIUM CHLORIDE 0.9 % IJ SOLN: 3.0000 mL | INTRAMUSCULAR | Status: DC | PRN

## 2013-03-28 MED ORDER — VERAPAMIL HCL 2.5 MG/ML IV SOLN
INTRAVENOUS | Status: AC
  Filled 2013-03-28: qty 2

## 2013-03-28 MED ORDER — SODIUM CHLORIDE 0.9 % IV SOLN: 1.0000 mL/kg/h | INTRAVENOUS | Status: AC

## 2013-03-28 MED ORDER — HEPARIN SODIUM (PORCINE) 1000 UNIT/ML IJ SOLN
INTRAMUSCULAR | Status: AC
  Filled 2013-03-28: qty 1

## 2013-03-28 MED ORDER — INFLUENZA VAC SPLIT QUAD 0.5 ML IM SUSP
0.5000 mL | INTRAMUSCULAR | Status: AC
  Administered 2013-03-29: 0.5 mL via INTRAMUSCULAR
  Filled 2013-03-28: qty 0.5

## 2013-03-28 MED ORDER — SODIUM CHLORIDE 0.9 % IV SOLN: 250.0000 mL | INTRAVENOUS | Status: DC | PRN

## 2013-03-28 MED ORDER — MIDAZOLAM HCL 2 MG/2ML IJ SOLN
INTRAMUSCULAR | Status: AC
  Filled 2013-03-28: qty 2

## 2013-03-28 MED ORDER — FENTANYL CITRATE 0.05 MG/ML IJ SOLN
INTRAMUSCULAR | Status: AC
  Filled 2013-03-28: qty 2

## 2013-03-28 MED ORDER — NITROGLYCERIN 0.2 MG/ML ON CALL CATH LAB
INTRAVENOUS | Status: AC
  Filled 2013-03-28: qty 1

## 2013-03-28 MED ORDER — SODIUM CHLORIDE 0.9 % IJ SOLN: 3.0000 mL | Freq: Two times a day (BID) | INTRAMUSCULAR | Status: DC

## 2013-03-28 MED ORDER — HEPARIN (PORCINE) IN NACL 2-0.9 UNIT/ML-% IJ SOLN
INTRAMUSCULAR | Status: AC
  Filled 2013-03-28: qty 1000

## 2013-03-28 NOTE — Progress Notes (Signed)
All belongings walked to cath lab room. Phone and charger in large bag. Pt. Will transfer from cath lab to new location. RN able to end relationship with patient. AO x4.

## 2013-03-28 NOTE — Interval H&P Note (Signed)
History and Physical Interval Note:  03/28/2013 2:09 PM  Sandra Stark  has presented today for surgery, with the diagnosis of Chest pain  The various methods of treatment have been discussed with the patient and family. After consideration of risks, benefits and other options for treatment, the patient has consented to  Procedure(s): LEFT HEART CATHETERIZATION WITH CORONARY ANGIOGRAM (N/A) as a surgical intervention .  The patient's history has been reviewed, patient examined, no change in status, stable for surgery.  I have reviewed the patient's chart and labs.  Questions were answered to the patient's satisfaction.    Cath Lab Visit (complete for each Cath Lab visit)  Clinical Evaluation Leading to the Procedure:   ACS: no  Non-ACS:    Anginal Classification: CCS II  Anti-ischemic medical therapy: Minimal Therapy (1 class of medications)  Non-Invasive Test Results: Intermediate-risk stress test findings: cardiac mortality 1-3%/year  Prior CABG: No previous CABG        Tonny Bollman

## 2013-03-28 NOTE — H&P (View-Only) (Signed)
ELECTROPHYSIOLOGY CONSULT NOTE    Patient ID: Sandra Stark MRN: [id number], DOB/AGE: [birth year] 47 y.o.  Admit date: 03/26/2013 Date of Consult: 03-27-2013  Primary Physician: Feliciana Rossetti, MD Primary Cardiologist: new to [facility]  Reason for Consultation: atrial arrhythmias  HPI:  Sandra Stark is a 47 year old female with a past medical history notable for hyperlipidemia and palpitations.  She presented to [facility] yesterday with palpitations and was found to be in atypical atrial flutter.  She was symptomatic with chest pain and was transferred to [facility] for further evaluation.    She has had palpitations on and off for the last several months.  These are increasing in frequency and duration.  They usually last for a few minutes at a time and are self terminating.  They have been increasingly worse over the last 3 days.   Lab work this admission is notable for negative cardiac enzymes, normal TSH (at [facility]), normal electrolytes.  She had a CT scan of her chest at [facility] which showed no evidence of PE, no aortic dissection, and 6mm RML nodule.  Echocardiogram yesterday demonstrated an EF of 60% with no RWMA, PA pressure of 38, LA 30.  She is pending myoview this morning to rule out coronary disease.  She has been placed on Metoprolol twice daily this admission.  She is married with 2 children and works as an Environmental health practitioner. She does not smoke, drinks alcohol 1-2 times/month and denies recreational drug use.  She is active and not functionally limited. She does report intermittent lower extremity edema for several years. She denies shortness of breath or frank syncope.  She states that she has "lightheaded" feelings during some of her palpitations.   EP has been asked to evaluate for treatment options.   ROS is negative except as outlined above.   Past Medical History  Diagnosis Date  . Palpitations     a. Reported to PCP 09/2012 but ECG reportedly OK.    Marland Kitchen Hyperlipidemia     a. Monitored by PCP - elected to pursue lifestyle modification in 09/2012 with reassessment pending.     Surgical History:  Past Surgical History  Procedure Laterality Date  . Appendectomy    . Cholecystectomy    . Hernia repair       Prescriptions prior to admission  Medication Sig Dispense Refill  . aspirin 325 MG tablet Take 325 mg by mouth once.        Inpatient Medications:  . aspirin EC  81 mg Oral Daily  . metoprolol succinate  25 mg Oral BID  . pantoprazole  40 mg Oral Daily  . sodium chloride  3 mL Intravenous Q12H    Allergies: No Known Allergies  History   Social History  . Marital Status: Married    Spouse Name: N/A    Number of Children: 2  . Years of Education: N/A   Occupational History  .      Adminstrative assistance   Social History Main Topics  . Smoking status: Never Smoker   . Smokeless tobacco: Never Used  . Alcohol Use: Yes     Comment: rare - 2x/month  . Drug Use: No  . Sexual Activity: Yes   Other Topics Concern  . Not on file   Social History Narrative  . No narrative on file     Family History  Problem Relation Age of Onset  . Heart disease      several family members on dad's side  have had heart issues - earliest in their 8's  . Colon cancer Mother   . Stroke Paternal Grandfather   . Rectal cancer Mother   . Breast cancer Sister   . Colon polyps Sister     x 2  . Stroke Paternal Grandmother   . Diabetes Paternal Grandfather   . Diabetes Paternal Grandmother       Labs:   Lab Results  Component Value Date   WBC 8.8 03/27/2013   HGB 12.2 03/27/2013   HCT 35.7* 03/27/2013   MCV 93.5 03/27/2013   PLT 192 03/27/2013    Recent Labs Lab 03/27/13 0336  NA 139  K 4.1  CL 104  CO2 26  BUN 10  CREATININE 1.05  CALCIUM 8.4  GLUCOSE 89   Lab Results  Component Value Date   TROPONINI <0.30 03/27/2013   Lab Results  Component Value Date   CHOL 169 03/27/2013   Lab Results  Component  Value Date   HDL 35* 03/27/2013   Lab Results  Component Value Date   LDLCALC 98 03/27/2013   Lab Results  Component Value Date   TRIG 178* 03/27/2013   Lab Results  Component Value Date   CHOLHDL 4.8 03/27/2013   No results found for this basename: LDLDIRECT    No results found for this basename: DDIMER     Radiology/Studies: No results found.  EKG: 04-18-2013 03:18 atypical atrial flutter, ventricular rate 154, QRS 102  TELEMETRY: sinus bradycardia with runs of 1:1 atrial tachycardia    EP Attending  Patient seen and examined. Agree with above. I saw the patient on 03/27/2013 with Gypsy Balsam as above. She has atrial tachycardia, atrial flutter and atrial fibrillation. She has an abnormal stress test and chest pain. As we would like to give her flecainide but cannot if she has obstructive CAD, will plan for left heart cath.  Leonia Reeves.D.

## 2013-03-28 NOTE — Progress Notes (Signed)
ANTICOAGULATION CONSULT NOTE - Follow Up Consult  Pharmacy Consult:  Heparin Indication: chest pain/ACS  No Known Allergies  Patient Measurements: Height: 5\' 5"  (165.1 cm) Weight: 200 lb (90.719 kg) IBW/kg (Calculated) : 57 Heparin Dosing Weight: 80 kg  Vital Signs: Temp: 97.7 F (36.5 C) (12/11 0810) Temp src: Oral (12/11 0810) BP: 116/37 mmHg (12/11 0810) Pulse Rate: 75 (12/11 0810)  Labs:  Recent Labs  03/26/13 1315 03/26/13 2000  03/26/13 2024 03/27/13 0033 03/27/13 0336 03/27/13 1645 03/28/13 0454  HGB  --   --   --   --   --  12.2  --  13.1  HCT  --   --   --   --   --  35.7*  --  39.2  PLT  --   --   --   --   --  192  --  216  LABPROT  --   --   --   --   --   --   --  12.4  INR  --   --   --   --   --   --   --  0.94  HEPARINUNFRC  --   --   < > 0.33  --  0.17* 0.33 0.46  CREATININE 0.89  --   --   --   --  1.05  --   --   TROPONINI <0.30 <0.30  --   --  <0.30  --   --   --   < > = values in this interval not displayed.  Estimated Creatinine Clearance: 73.7 ml/min (by C-G formula based on Cr of 1.05).      Assessment: 79 YOF transferred from [place] with chest pain and atrial flutter.  Patient continues on IV heparin and heparin level is therapeutic.  No bleeding reported.  Noted plan for cath today.   Goal of Therapy:  Heparin level 0.3-0.7 units/ml Monitor platelets by anticoagulation protocol: Yes    Plan:  - Heparin gtt at 1400 units/hr - Daily HL / CBC - F/U post cath    Valkyrie Guardiola D. Laney Potash, PharmD, BCPS Pager:  [phone] 03/28/2013, 10:14 AM

## 2013-03-28 NOTE — CV Procedure (Signed)
    Cardiac Catheterization Procedure Note  Name: Sandra Stark MRN: [id number] DOB: [birth year]  Procedure: Left Heart Cath, Selective Coronary Angiography, LV angiography  Indication: Abnormal Myoview   Procedural Details: The right wrist was prepped, draped, and anesthetized with 1% lidocaine. Using the modified Seldinger technique, a 5 French sheath was introduced into the right radial artery. 3 mg of verapamil was administered through the sheath, weight-based unfractionated heparin was administered intravenously. Standard Judkins catheters were used for selective coronary angiography and left ventriculography. Catheter exchanges were performed over an exchange length guidewire. There were no immediate procedural complications. A TR band was used for radial hemostasis at the completion of the procedure.  The patient was transferred to the post catheterization recovery area for further monitoring.  Procedural Findings: Hemodynamics: AO 110/65 LV 115/20  Coronary angiography: Coronary dominance: right  Left mainstem: Widely patent. Arises from the left cusp and divides into the LAD and left circumflex  Left anterior descending (LAD): Normal vessel reaching the LV apex, moderate caliber high diagonal branch  Left circumflex (LCx): Large caliber vessel, 2 large OM branches without stenosis  Right coronary artery (RCA): Dominant vessel, no obstructive disease. PDA is patent.  Left ventriculography: Left ventricular systolic function is normal, LVEF is estimated at 55-65%, there is no significant mitral regurgitation   Final Conclusions:   1. Normal coronary arteries 2. Normal LV function  Tonny Bollman 03/28/2013, 2:47 PM

## 2013-03-28 NOTE — Progress Notes (Signed)
TR BAND REMOVAL  LOCATION:    right radial  DEFLATED PER PROTOCOL:    yes  TIME BAND OFF / DRESSING APPLIED:    2000   SITE UPON ARRIVAL:    Level 0  SITE AFTER BAND REMOVAL:    Level 0  REVERSE ALLEN'S TEST:     positive  CIRCULATION SENSATION AND MOVEMENT:    Within Normal Limits   yes  COMMENTS:   Written post radial cath instructions given and reviewed w/ pt, questions answered.  Advised pt to ask MD about when to return to work since it requires much typing.

## 2013-03-29 ENCOUNTER — Encounter (HOSPITAL_COMMUNITY): Payer: Self-pay | Admitting: Physician Assistant

## 2013-03-29 DIAGNOSIS — I480 Paroxysmal atrial fibrillation: Secondary | ICD-10-CM

## 2013-03-29 DIAGNOSIS — R001 Bradycardia, unspecified: Secondary | ICD-10-CM

## 2013-03-29 DIAGNOSIS — I471 Supraventricular tachycardia: Secondary | ICD-10-CM | POA: Diagnosis present

## 2013-03-29 DIAGNOSIS — I4719 Other supraventricular tachycardia: Secondary | ICD-10-CM | POA: Diagnosis present

## 2013-03-29 DIAGNOSIS — R739 Hyperglycemia, unspecified: Secondary | ICD-10-CM | POA: Diagnosis present

## 2013-03-29 DIAGNOSIS — R911 Solitary pulmonary nodule: Secondary | ICD-10-CM | POA: Diagnosis present

## 2013-03-29 HISTORY — DX: Paroxysmal atrial fibrillation: I48.0

## 2013-03-29 HISTORY — DX: Bradycardia, unspecified: R00.1

## 2013-03-29 LAB — BASIC METABOLIC PANEL
BUN: 11 mg/dL (ref 6–23)
CO2: 26 mEq/L (ref 19–32)
Calcium: 8.8 mg/dL (ref 8.4–10.5)
Creatinine, Ser: 0.95 mg/dL (ref 0.50–1.10)
GFR calc Af Amer: 81 mL/min — ABNORMAL LOW (ref >90)

## 2013-03-29 MED ORDER — METOPROLOL SUCCINATE ER 25 MG PO TB24: 12.5000 mg | ORAL_TABLET | Freq: Every day | ORAL | Status: DC

## 2013-03-29 MED ORDER — FLECAINIDE ACETATE 50 MG PO TABS: 50.0000 mg | ORAL_TABLET | Freq: Two times a day (BID) | ORAL | Status: DC

## 2013-03-29 MED ORDER — FLECAINIDE ACETATE 50 MG PO TABS
50.0000 mg | ORAL_TABLET | Freq: Two times a day (BID) | ORAL | Status: DC
  Administered 2013-03-29: 10:00:00 50 mg via ORAL
  Filled 2013-03-29 (×3): qty 1

## 2013-03-29 MED ORDER — METOPROLOL SUCCINATE 12.5 MG HALF TABLET
12.5000 mg | ORAL_TABLET | Freq: Every day | ORAL | Status: DC
  Administered 2013-03-29: 10:00:00 12.5 mg via ORAL
  Filled 2013-03-29 (×2): qty 1

## 2013-03-29 MED ORDER — OMEPRAZOLE 20 MG PO CPDR: 20.0000 mg | DELAYED_RELEASE_CAPSULE | Freq: Every day | ORAL | Status: DC

## 2013-03-29 NOTE — Progress Notes (Signed)
Tele shows HR down to 37, pt dozing quietly, awakens easily, denies complaints.  BP 123/53.  Hr jumped up to 90's, then back down to 40's, SB.  Will continue to monitor.  Currently SB 50's with freq PAC's.

## 2013-03-29 NOTE — Progress Notes (Addendum)
Patient ID: Sandra Stark, female   DOB: 1965-11-11, 47 y.o.   MRN: [id number]   SUBJECTIVE: Patient had runs of atypical atrial flutter and a different rhythm that appeared to be atrial tachycardia overnight, nothing lasted long.  She feels good this morning.  HR gets down to the 40s while she is asleep.  Marland Kitchen aspirin EC  81 mg Oral Daily  . flecainide  50 mg Oral Q12H  . influenza vac split quadrivalent PF  0.5 mL Intramuscular Tomorrow-1000  . metoprolol succinate  12.5 mg Oral Daily  . pantoprazole  40 mg Oral Daily      Filed Vitals:   03/28/13 1958 03/29/13 0001 03/29/13 0318 03/29/13 0400  BP: 105/57 97/35 123/53 126/56  Pulse: 83 53  68  Temp: 98 F (36.7 C) 97.6 F (36.4 C)  97.8 F (36.6 C)  TempSrc: Oral Oral  Oral  Resp: 16 16  16   Height:      Weight:      SpO2: 95% 97%  99%    Intake/Output Summary (Last 24 hours) at 03/29/13 0844 Last data filed at 03/28/13 2115  Gross per 24 hour  Intake 1636.5 ml  Output    750 ml  Net  886.5 ml    LABS: Basic Metabolic Panel:  Recent Labs  40/98/11 1315 03/27/13 0336  NA 139 139  K 4.1 4.1  CL 106 104  CO2 25 26  GLUCOSE 91 89  BUN 10 10  CREATININE 0.89 1.05  CALCIUM 8.9 8.4  MG 2.1  --    Liver Function Tests: No results found for this basename: AST, ALT, ALKPHOS, BILITOT, PROT, ALBUMIN,  in the last 72 hours No results found for this basename: LIPASE, AMYLASE,  in the last 72 hours CBC:  Recent Labs  03/27/13 0336 03/28/13 0454  WBC 8.8 10.9*  HGB 12.2 13.1  HCT 35.7* 39.2  MCV 93.5 93.1  PLT 192 216   Cardiac Enzymes:  Recent Labs  03/26/13 1315 03/26/13 2000 03/27/13 0033  TROPONINI <0.30 <0.30 <0.30   BNP: No components found with this basename: POCBNP,  D-Dimer: No results found for this basename: DDIMER,  in the last 72 hours Hemoglobin A1C:  Recent Labs  03/26/13 1315  HGBA1C 5.8*   Fasting Lipid Panel:  Recent Labs  03/27/13 0336  CHOL 169  HDL 35*  LDLCALC 98    TRIG 178*  CHOLHDL 4.8   Thyroid Function Tests: No results found for this basename: TSH, T4TOTAL, FREET3, T3FREE, THYROIDAB,  in the last 72 hours Anemia Panel: No results found for this basename: VITAMINB12, FOLATE, FERRITIN, TIBC, IRON, RETICCTPCT,  in the last 72 hours  RADIOLOGY: Nm Myocar Multi W/spect W/wall Motion / Ef  03/27/2013   CLINICAL DATA:  Chest pain.  Treadmill stress test.  EXAM: MYOCARDIAL IMAGING WITH SPECT (REST AND EXERCISE)  GATED LEFT VENTRICULAR WALL MOTION STUDY  LEFT VENTRICULAR EJECTION FRACTION  TECHNIQUE: Standard myocardial SPECT imaging was performed after resting intravenous injection of 10 mCi Tc-50m sestamibi. Subsequently, exercise tolerance test was performed by the patient under the supervision of the Cardiology staff. At peak-stress, 30 mCi Tc-67m sestamibi was injected intravenously and standard myocardial SPECT imaging was performed. Quantitative gated imaging was also performed to evaluate left ventricular wall motion, and estimate left ventricular ejection fraction.  COMPARISON:  Chest CT 03/26/2013.  FINDINGS: There is a small area of anterolateral wall reversible ischemia. This is in the mid anterior wall extending laterally. No fixed perfusion  defects are identified. Wall motion appeared normal. Normal mural thickening was observed. Calculated ejection fraction was decreased at 51%. End-diastolic volume is 80 mL. End systolic volume was 39 mL.  IMPRESSION: Small area of mid anterolateral reversible ischemia. Calculated ejection fraction is 51%.   Electronically Signed   By: Andreas Newport M.D.   On: 03/27/2013 16:15    PHYSICAL EXAM General: NAD Neck: No JVD, no thyromegaly or thyroid nodule.  Lungs: Clear to auscultation bilaterally with normal respiratory effort. CV: Nondisplaced PMI.  Heart regular S1/S2, no S3/S4, no murmur.  No peripheral edema.  No carotid bruit.  Normal pedal pulses.  Abdomen: Soft, nontender, no hepatosplenomegaly, no  distention.  Neurologic: Alert and oriented x 3.  Psych: Normal affect. Extremities: No clubbing or cyanosis.  Right radial cath site benign.   TELEMETRY: Reviewed telemetry pt in NSR currently.  Atypical atrial flutter with RVR and another SVT rhythm that appears to be atrial tachycardia noted overnight.  ASSESSMENT AND PLAN: 1. Chest pain: Noncardiac chest pain.  LHC yesterday with no significant CAD. 2. Atrial arrhythmias: Atypical atrial flutter and atrial tachycardia with RVR have been noted.  Dr. Lubertha Basque note reports runs of atrial fibrillation.  Currently in NSR in the 70s.  As she had no coronary disease, I will start her on flecainide 50 mg bid with Toprol XL 12.5 mg daily (hopefully she will tolerate the low dose of Toprol XL).  CHADSVASC = 1 (gender) so no anticoagulation.  3. Disposition: May go home this afternoon if she tolerates flecainide and Toprol XL initial doses this morning. Would send home on Toprol XL 12.5 mg daily and flecainide 50 mg bid.  Followup in office in 10 days.   Marca Ancona 03/29/2013 8:48 AM

## 2013-03-29 NOTE — Discharge Summary (Signed)
Discharge Summary   Patient ID: Sandra Stark MRN: [id number], DOB/AGE: [birth year] 47 y.o. Admit date: 03/26/2013 D/C date:     03/29/2013  Primary Care Provider: Feliciana Rossetti, MD Primary Cardiologist: Sandra Stark. Also seen by Dr. Ladona Stark with EP  Primary Discharge Diagnoses:  1. Noncardiac chest pain 2. Paroxysmal atypical atrial flutter 3. Paroxysmal atrial tachycardia 4. Paroxysmal atrial fibrillation 5. Sinus bradycardia 6. Hyperlipidemia 7. 3mm RML pulmonary nodule 8. Hyperglycemia A1C 5.8 this admission 9. Obesity Body mass index is 33.28 kg/(m^2).  Secondary Discharge Diagnoses:  1. Hyperlipidemia, followed by PCP  Hospital Course:Sandra Stark is a 47 y/o F with history of hyperlipidemia & palpitations but no formal cardiac diagnoses who presented to [facility] 03/26/2013 with CP and palpitations. She saw her PCP in 09/2012 c/o rare palpitations - she states an EKG was done that did not show anything. Her cholesterol was also reportedly elevated but she elected a trial of lifestyle modifications prior to statin initiation. For the past 2-3 days prior to admission she reported fatigue and palpitations. Some palpitations were short-lived but other episodes were longer with associated anxiety and tingling all over. She also developed fairly constant chest pressure the night before admission. When symptoms did not improve, she called 911 and was taken to [facility] Initial troponin was negative, thyroid function was normal, urine pregnancy was normal, and CT angio did not show any acute PE; thoracic aorta was normal in course and caliber; 3mm RML pulm nodule. During her ER stay and hospital admission, she was found to have occasional PACs with atrial bigeminy, paroxysmal atrial flutter, paroxysmal atrial fibrillation and atrial tachycardia. This was interspersed with sinus bradycardia as well. HR went down into the 40's on 25mg  BID of Toprol this this was scaled back. Troponins  remained negative. To further evaluate her CP, she underwent nuclear stress testing. She did well on the treadmill, exercising over 8 minutes without any arrhythmias or ST changes. During recovery she did have some PACs and nonsustained atrial tach then returned to NSR. Nuclear images were actually abnormal indicating a small area of mid anterolateral reversible ischemia, calculated ejection fraction is 51%. 2D Echo showed mild LVH, EF 60% without RWMA, PA pressure . EP saw the patient and recommended antiarrhythmic therapy. However, in the presence of abnormal nuclear stress test it was felt that she needed to be evaluated for CAD. Her cath on 03/28/13 showed normal coronary arteries and normal LV function. Her chest pain was felt noncardiac. She was started on flecainide 50mg  BID and restarted on low-dose Toprol XL. She was observed on telemetry without complication this afternoon. She will followup in the office. CHADSVASC presently 1 so no anticoagulation is required at this time. Dr. Shirlee Stark has seen and examined the patient today and feels she is stable for discharge. She was instructed to f/u PCP to discuss if further imaging is needed for CT scan (radiology report recommended f/u 1 yr if high risk for bronchogenic carcinoma, no f/u if felt to be low risk). She was also started on PPI for noncardiac CP and asked to f/u PCP to discuss nonheart causes of this.  Discharge Vitals: Blood pressure 102/54, pulse 68, temperature 97.8 F (36.6 C), temperature source Oral, resp. rate 18, height 5\' 5"  (1.651 m), weight 200 lb (90.719 kg), last menstrual period 03/02/2013, SpO2 97.00%.  Labs: Lab Results  Component Value Date   WBC 10.9* 03/28/2013   HGB 13.1 03/28/2013   HCT 39.2 03/28/2013   MCV 93.1 03/28/2013  PLT 216 03/28/2013     Recent Labs Lab 03/29/13 0827  NA 137  K 4.0  CL 104  CO2 26  BUN 11  CREATININE 0.95  CALCIUM 8.8  GLUCOSE 93    Recent Labs  03/26/13 2000  03/27/13 0033  TROPONINI <0.30 <0.30   Lab Results  Component Value Date   CHOL 169 03/27/2013   HDL 35* 03/27/2013   LDLCALC 98 03/27/2013   TRIG 178* 03/27/2013     Diagnostic Studies/Procedures   2D Echo 03/26/13 - Left ventricle: Technically limited study. The cavity size was normal. Wall thickness was increased in a pattern of mild LVH. The estimated ejection fraction was 60%. Wall motion was normal; there were no regional wall motion abnormalities. - Pulmonary arteries: PA peak pressure: 38mm Hg (S).  Cardiac Catheterization Procedure Note 03/28/13 Name: Sandra Stark  MRN: [id number]  DOB: 1966-02-22  Procedure: Left Heart Cath, Selective Coronary Angiography, LV angiography  Indication: Abnormal Myoview  Procedural Details: The right wrist was prepped, draped, and anesthetized with 1% lidocaine. Using the modified Seldinger technique, a 5 French sheath was introduced into the right radial artery. 3 mg of verapamil was administered through the sheath, weight-based unfractionated heparin was administered intravenously. Standard Judkins catheters were used for selective coronary angiography and left ventriculography. Catheter exchanges were performed over an exchange length guidewire. There were no immediate procedural complications. A TR band was used for radial hemostasis at the completion of the procedure. The patient was transferred to the post catheterization recovery area for further monitoring.  Procedural Findings:  Hemodynamics:  AO 110/65  LV 115/20  Coronary angiography:  Coronary dominance: right  Left mainstem: Widely patent. Arises from the left cusp and divides into the LAD and left circumflex  Left anterior descending (LAD): Normal vessel reaching the LV apex, moderate caliber high diagonal branch  Left circumflex (LCx): Large caliber vessel, 2 large OM branches without stenosis  Right coronary artery (RCA): Dominant vessel, no obstructive disease. PDA is  patent.  Left ventriculography: Left ventricular systolic function is normal, LVEF is estimated at 55-65%, there is no significant mitral regurgitation  Final Conclusions:  1. Normal coronary arteries  2. Normal LV function  Nm Myocar Multi W/spect W/wall Motion / Ef 03/27/2013   CLINICAL DATA:  Chest pain.  Treadmill stress test.  EXAM: MYOCARDIAL IMAGING WITH SPECT (REST AND EXERCISE)  GATED LEFT VENTRICULAR WALL MOTION STUDY  LEFT VENTRICULAR EJECTION FRACTION  TECHNIQUE: Standard myocardial SPECT imaging was performed after resting intravenous injection of 10 mCi Tc-73m sestamibi. Subsequently, exercise tolerance test was performed by the patient under the supervision of the Cardiology staff. At peak-stress, 30 mCi Tc-52m sestamibi was injected intravenously and standard myocardial SPECT imaging was performed. Quantitative gated imaging was also performed to evaluate left ventricular wall motion, and estimate left ventricular ejection fraction.  COMPARISON:  Chest CT 03/26/2013.  FINDINGS: There is a small area of anterolateral wall reversible ischemia. This is in the mid anterior wall extending laterally. No fixed perfusion defects are identified. Wall motion appeared normal. Normal mural thickening was observed. Calculated ejection fraction was decreased at 51%. End-diastolic volume is 80 mL. End systolic volume was 39 mL.  IMPRESSION: Small area of mid anterolateral reversible ischemia. Calculated ejection fraction is 51%.   Electronically Signed   By: Andreas Newport M.D.   On: 03/27/2013 16:15   :  CXR: no acute cardiopulmonary process seen  CT angio: no PE nor acute CP process. Thoracic aorta is normal  in course and caliber, unremarkable. No pneuomthorax. thracheobronchial tree is patent and midline. 3mm RML pulm nodule- if high risk for bronchogenic carcinoma, f/u CT 1 yr. If low risk, no f/u needed   Discharge Medications     Medication List         flecainide 50 MG tablet   Commonly known as:  TAMBOCOR  Take 1 tablet (50 mg total) by mouth every 12 (twelve) hours.     metoprolol succinate 25 MG 24 hr tablet  Commonly known as:  TOPROL XL  Take 0.5 tablets (12.5 mg total) by mouth daily.     omeprazole 20 MG capsule  Commonly known as:  PRILOSEC  Take 1 capsule (20 mg total) by mouth daily.        Disposition   The patient will be discharged in stable condition to home. Discharge Orders   Future Appointments Provider Department Dept Phone   04/08/2013 11:50 AM Beatrice Lecher, PA-C [facility] [phone]   Future Orders Complete By Expires   Diet - low sodium heart healthy  As directed    Increase activity slowly  As directed    Comments:     No driving for 2 days. No lifting over 5 lbs for 1 week. No sexual activity for 1 week. You may return to work on 04/01/13 with the above restriction. Keep procedure site clean & dry. If you notice increased pain, swelling, bleeding or pus, call/return!  You may shower, but no soaking baths/hot tubs/pools for 1 week.     Follow-up Information   Follow up with Tereso Newcomer, PA-C. (04/08/13 at 11:50am)    Specialty:  Physician Assistant   Contact information:   [address] [place] [address] [phone]       Follow up with Sandra Rossetti, MD. (Please discuss evaluation of non-heart causes of your chest pain. Your CT scan showed a very small pulmonary nodule. Please discuss with your primary doctor to determine if you need a repeat CT scan in a year.)    Specialty:  Internal Medicine   Contact information:   [address] [place] [address] [phone]         Duration of Discharge Encounter: Greater than 30 minutes including physician and PA time.  Signed, Kriste Basque Demarian Epps PA-C 03/29/2013, 2:01 PM

## 2013-04-08 ENCOUNTER — Encounter: Payer: Self-pay | Admitting: Physician Assistant

## 2013-04-08 ENCOUNTER — Ambulatory Visit (INDEPENDENT_AMBULATORY_CARE_PROVIDER_SITE_OTHER): Payer: BC Managed Care – PPO | Admitting: Physician Assistant

## 2013-04-08 VITALS — BP 122/74 | HR 75 | Ht 65.5 in | Wt 235.8 lb

## 2013-04-08 DIAGNOSIS — I471 Supraventricular tachycardia: Secondary | ICD-10-CM

## 2013-04-08 DIAGNOSIS — I4892 Unspecified atrial flutter: Secondary | ICD-10-CM

## 2013-04-08 DIAGNOSIS — R911 Solitary pulmonary nodule: Secondary | ICD-10-CM

## 2013-04-08 DIAGNOSIS — I4891 Unspecified atrial fibrillation: Secondary | ICD-10-CM

## 2013-04-08 DIAGNOSIS — I480 Paroxysmal atrial fibrillation: Secondary | ICD-10-CM

## 2013-04-08 DIAGNOSIS — I498 Other specified cardiac arrhythmias: Secondary | ICD-10-CM

## 2013-04-08 HISTORY — DX: Solitary pulmonary nodule: R91.1

## 2013-04-08 MED ORDER — METOPROLOL SUCCINATE ER 25 MG PO TB24: 25.0000 mg | ORAL_TABLET | Freq: Every day | ORAL | Status: DC

## 2013-04-08 NOTE — Patient Instructions (Addendum)
We will schedule you for an exercise treadmill test.   Your physician assistant recommends that you schedule a follow-up appointment in: 2 months with Dr. Marca Ancona

## 2013-04-08 NOTE — Progress Notes (Signed)
[address] [place], [place]  [address] Phone: [phone] Fax:  [phone]  Date:  04/08/2013   ID:  Sandra Stark, DOB [birth year], MRN [id number]  PCP:  Feliciana Rossetti, MD  Cardiologist:  Dr. Marca Ancona   Electrophysiologist:  Dr. Lewayne Bunting    History of Present Illness: Sandra Stark is a 47 y.o. female with a hx of hyperlipidemia and palpitations. She was admitted 12/9-12/12 with chest pain and palpitations. She was transferred from [facility]. Chest CTA was negative for pulmonary embolism but did demonstrate a right middle lobe pulmonary nodule. She ruled out for myocardial infarction by enzymes. She was found to have atrial arrhythmias on telemetry (atrial flutter, atrial fibrillation and atrial tachycardia).  She was seen by Dr. Ladona Ridgel for electrophysiology.  Flecainide was recommended. Myoview (03/27/13): Anterolateral ischemia, EF 51%. Echocardiogram (03/26/13): Mild LVH, EF 60%, normal wall motion, PASP 38. LHC (03/28/2013):  Normal coronary arteries, EF 55-65%. She was placed on Flecainide and low dose Toprol XL. Her palpitations improved.  CHADS2-VASc=1 (gender).  She was not placed on anticoagulation.    Since discharge, she is doing well. Her palpitations have greatly improved. She had one episode of irregular palpitations that lasted about an hour with associated chest discomfort. Otherwise, she denies chest pain, shortness of breath, syncope, orthopnea, PND. She has mild pedal edema without change.  Recent Labs: 03/27/2013: HDL 35*; LDL (calc) 98  03/28/2013: Hemoglobin 13.1  03/29/2013: Creatinine 0.95; Potassium 4.0   Wt Readings from Last 3 Encounters:  04/08/13 235 lb 12.8 oz (106.958 kg)  03/26/13 200 lb (90.719 kg)  03/26/13 200 lb (90.719 kg)     Past Medical History  Diagnosis Date  . Hyperlipidemia     a. Monitored by PCP - elected to pursue lifestyle modification in 09/2012 with reassessment pending.  . Chest pain     a.  03/2013: cath negative for CAD, no PE by CT angio.  . Paroxysmal atrial flutter     a. Dx 03/2013.  Marland Kitchen Atrial tachycardia     a. Dx 03/2013.  Marland Kitchen PAF (paroxysmal atrial fibrillation)     a. Dx 03/2013 - started on flecainide. CHADSVASC = 1 so no anticoag at present time.  Marland Kitchen PAC (premature atrial contraction)     a. Dx 03/2013.  . Pulmonary nodule, right     a. 3mm RML by CT angio 03/2013.  Marland Kitchen Hyperglycemia     a. A1c 5.8 in 03/2013.    Current Outpatient Prescriptions  Medication Sig Dispense Refill  . flecainide (TAMBOCOR) 50 MG tablet Take 1 tablet (50 mg total) by mouth every 12 (twelve) hours.  60 tablet  1  . metoprolol succinate (TOPROL XL) 25 MG 24 hr tablet Take 0.5 tablets (12.5 mg total) by mouth daily.  30 tablet  1  . omeprazole (PRILOSEC) 20 MG capsule Take 1 capsule (20 mg total) by mouth daily.  30 capsule  0   No current facility-administered medications for this visit.    Allergies:   Review of patient's allergies indicates no known allergies.   Social History:  The patient  reports that she has never smoked. She has never used smokeless tobacco. She reports that she drinks alcohol. She reports that she does not use illicit drugs.   Family History:  The patient's family history includes Breast cancer in her sister; Colon cancer in her mother; Colon polyps in her sister; Diabetes in her paternal grandfather and paternal grandmother; Heart disease in  an other family member; Rectal cancer in her mother; Stroke in her paternal grandfather and paternal grandmother.   ROS:  Please see the history of present illness.      All other systems reviewed and negative.   PHYSICAL EXAM: VS:  BP 122/74  Pulse 75  Ht 5' 5.5" (1.664 m)  Wt 235 lb 12.8 oz (106.958 kg)  BMI 38.63 kg/m2  LMP 03/02/2013 Well nourished, well developed, in no acute distress HEENT: normal Neck: no JVD Cardiac:  normal S1, S2; RRR; no murmur Lungs:  clear to auscultation bilaterally, no wheezing,  rhonchi or rales Abd: soft, nontender, no hepatomegaly Ext: no edema; right wrist without hematoma or mass  Skin: warm and dry Neuro:  CNs 2-12 intact, no focal abnormalities noted  EKG:  NSR, HR 75, normal axis, no ST changes     ASSESSMENT AND PLAN:  1. Atrial Arrhythmias: Much better controlled on flecainide. She has had one episode of palpitations. I believe that her heart rate and blood pressure will tolerate a higher dose of Toprol. Therefore, I have asked her to increase her Toprol to 25 mg daily. As she is now on Flecainide, I have recommended arranging an exercise treadmill test to rule out pro-arrhythmia.  As noted, her risk factor profile is low and she does not require anticoagulation. 2. Pulmonary Nodule: She has follow up testing arranged through her primary care physician. 3. Disposition: Follow up with Dr. Shirlee Latch in 2 months.  Signed, Tereso Newcomer, PA-C  04/08/2013 12:22 PM

## 2013-04-16 ENCOUNTER — Ambulatory Visit (HOSPITAL_COMMUNITY)
Admission: RE | Admit: 2013-04-16 | Discharge: 2013-04-16 | Disposition: A | Discharge status: Home / Self Care | Payer: BC Managed Care – PPO | Source: Ambulatory Visit | Attending: Physician Assistant | Admitting: Physician Assistant

## 2013-04-16 DIAGNOSIS — I4891 Unspecified atrial fibrillation: Secondary | ICD-10-CM | POA: Insufficient documentation

## 2013-04-16 DIAGNOSIS — I480 Paroxysmal atrial fibrillation: Secondary | ICD-10-CM

## 2013-04-17 ENCOUNTER — Telehealth: Payer: Self-pay | Admitting: *Deleted

## 2013-04-17 NOTE — Telephone Encounter (Signed)
lmptcb for stress test results 

## 2013-04-26 ENCOUNTER — Other Ambulatory Visit (HOSPITAL_COMMUNITY): Payer: Self-pay | Admitting: Physician Assistant

## 2013-05-24 ENCOUNTER — Other Ambulatory Visit (HOSPITAL_COMMUNITY): Payer: Self-pay | Admitting: Physician Assistant

## 2013-05-28 ENCOUNTER — Other Ambulatory Visit (HOSPITAL_COMMUNITY): Payer: Self-pay | Admitting: Cardiology

## 2013-06-06 ENCOUNTER — Ambulatory Visit: Payer: BC Managed Care – PPO | Admitting: Cardiology

## 2013-06-11 ENCOUNTER — Ambulatory Visit: Payer: BC Managed Care – PPO | Admitting: Cardiology

## 2013-06-25 ENCOUNTER — Encounter: Payer: Self-pay | Admitting: Cardiology

## 2013-06-25 ENCOUNTER — Ambulatory Visit (INDEPENDENT_AMBULATORY_CARE_PROVIDER_SITE_OTHER): Payer: BC Managed Care – PPO | Admitting: Cardiology

## 2013-06-25 VITALS — BP 128/74 | HR 66 | Ht 65.5 in | Wt 243.0 lb

## 2013-06-25 DIAGNOSIS — I480 Paroxysmal atrial fibrillation: Secondary | ICD-10-CM

## 2013-06-25 DIAGNOSIS — R0989 Other specified symptoms and signs involving the circulatory and respiratory systems: Secondary | ICD-10-CM

## 2013-06-25 DIAGNOSIS — G4733 Obstructive sleep apnea (adult) (pediatric): Secondary | ICD-10-CM

## 2013-06-25 DIAGNOSIS — I4891 Unspecified atrial fibrillation: Secondary | ICD-10-CM

## 2013-06-25 DIAGNOSIS — I4892 Unspecified atrial flutter: Secondary | ICD-10-CM

## 2013-06-25 DIAGNOSIS — I498 Other specified cardiac arrhythmias: Secondary | ICD-10-CM

## 2013-06-25 DIAGNOSIS — I471 Supraventricular tachycardia: Secondary | ICD-10-CM

## 2013-06-25 DIAGNOSIS — R0609 Other forms of dyspnea: Secondary | ICD-10-CM

## 2013-06-25 DIAGNOSIS — R0683 Snoring: Secondary | ICD-10-CM

## 2013-06-25 HISTORY — DX: Obstructive sleep apnea (adult) (pediatric): G47.33

## 2013-06-25 LAB — TSH: TSH: 1.21 u[IU]/mL (ref 0.35–5.50)

## 2013-06-25 NOTE — Progress Notes (Signed)
Patient ID: Sandra Stark, female   DOB: May 01, 1965, 48 y.o.   MRN: [id number] PCP: Dr. Bea Graff  48 yo with history of atrial arrhythmias presents for cardiology followup.  Patient was admitted to [facility] in 12/14 with chest pain and palpitations.  She was found to be in atrial flutter.  She was also variously noted to have atrial tachycardia and atrial fibrillation.  Echo showed preserved EF.  It was decided to start her on flecainide to keep her in NSR.  Therefore, she had a Cardiolite.  This showed anterolateral ischemia.  This was followed by [facility] which showed normal coronaries. She was started on flecainide and Toprol XL.  ETT in 12/14 for flecainide-related abnormalities was normal.    Since discharge, she has only had 2 mild episodes of palpitations.  This is much improved compared to prior to admission.  No exertional dyspnea or chest pain.  She does report snoring and daytime sleepiness.    ECG: NSR, normal  Labs (12/14): K 4, creatinine 0.95  PMH: 1. Hyperlipidemia 2. H/o appendectomy 3. H/o cholecystectomy 4. Atrial arrhythmias: 12/14 admission with atrial flutter, atrial fibrillation, and atrial tachycardia all seen. Echo (12/14) with EF 60%, mild LVH, PA systolic pressure 38 mmHg. Flecainide started.  ETT (12/14) was normal.  5. Cardiolite (12/14) with anterolateral ischemia, EF 51%.  LHC (12/14) with normal coronaries.  6. Obesity  SH: Married with 2 children.  Lives in [place].  Nonsmoker, rare ETOH.   FH: Uncle and aunts with atrial fibrillation.  Grandfather with MI.   ROS: All systems reviewed and negative except as per HPI.   Current Outpatient Prescriptions  Medication Sig Dispense Refill  . atorvastatin (LIPITOR) 10 MG tablet Take 10 mg by mouth daily.      . flecainide (TAMBOCOR) 50 MG tablet TAKE 1 TABLET EVERY 12 HOURS  60 tablet  1  . metoprolol succinate (TOPROL XL) 25 MG 24 hr tablet Take 1 tablet (25 mg total) by mouth daily.  30 tablet  11  . omeprazole  (PRILOSEC) 20 MG capsule TAKE 1 CAPSULE EVERY DAY  30 capsule  0  . aspirin EC 81 MG tablet Take 1 tablet (81 mg total) by mouth daily.       No current facility-administered medications for this visit.    BP 128/74  Pulse 66  Ht 5' 5.5" (1.664 m)  Wt 110.224 kg (243 lb)  BMI 39.81 kg/m2 General: NAD, obese Neck: No JVD, no thyromegaly or thyroid nodule.  Lungs: Clear to auscultation bilaterally with normal respiratory effort. CV: Nondisplaced PMI.  Heart regular S1/S2, no S3/S4, no murmur.  No peripheral edema.  No carotid bruit.  Normal pedal pulses.  Abdomen: Soft, nontender, no hepatosplenomegaly, no distention.  Skin: Intact without lesions or rashes.  Neurologic: Alert and oriented x 3.  Psych: Normal affect. Extremities: No clubbing or cyanosis.   Assessment/Plan: 1. Atrial arrhythmias: She has been noted to have atrial flutter, atrial tachycardia, and atrial fibrillation.  Today, she is in NSR and has not felt significant palpitations recently.  - CHADSVASC =1 (gender) so she is not on anticoagulation.  - Continue flecainide to maintain NSR.  Will also continue Toprol XL.  - Check TSH.  2. Suspected OSA: I will arrange for a sleep study.  OSA will increase the risk of recurrent atrial arrhythmias and treatment can decrease the risk.   Sandra Stark 06/25/2013

## 2013-06-25 NOTE — Patient Instructions (Signed)
Start aspirin 81mg  daily. Take this with food.  Your physician recommends that you have  lab work today--TSH.   Your physician has recommended that you have a sleep study. This test records several body functions during sleep, including: brain activity, eye movement, oxygen and carbon dioxide blood levels, heart rate and rhythm, breathing rate and rhythm, the flow of air through your mouth and nose, snoring, body muscle movements, and chest and belly movement.  Your physician wants you to follow-up in: 6 months with Dr Aundra Dubin. (September 2015) You will receive a reminder letter in the mail two months in advance. If you don't receive a letter, please call our office to schedule the follow-up appointment.

## 2013-06-28 ENCOUNTER — Other Ambulatory Visit (HOSPITAL_COMMUNITY): Payer: Self-pay | Admitting: Cardiology

## 2013-07-01 NOTE — Addendum Note (Signed)
Addended by: Katrine Coho on: 07/01/2013 08:03 AM   Modules accepted: Orders

## 2013-07-29 ENCOUNTER — Other Ambulatory Visit (HOSPITAL_COMMUNITY): Payer: Self-pay | Admitting: Cardiology

## 2013-08-02 ENCOUNTER — Ambulatory Visit (HOSPITAL_BASED_OUTPATIENT_CLINIC_OR_DEPARTMENT_OTHER): Payer: BC Managed Care – PPO | Attending: Cardiology

## 2013-11-29 ENCOUNTER — Other Ambulatory Visit (HOSPITAL_COMMUNITY): Payer: Self-pay | Admitting: Cardiology

## 2014-03-27 ENCOUNTER — Encounter (HOSPITAL_COMMUNITY): Payer: Self-pay | Admitting: Cardiovascular Disease

## 2014-04-02 ENCOUNTER — Other Ambulatory Visit: Payer: Self-pay | Admitting: Physician Assistant

## 2014-04-02 ENCOUNTER — Other Ambulatory Visit: Payer: Self-pay | Admitting: Cardiology

## 2014-05-29 ENCOUNTER — Other Ambulatory Visit: Payer: Self-pay | Admitting: Cardiology

## 2014-05-30 ENCOUNTER — Other Ambulatory Visit: Payer: Self-pay

## 2014-05-30 MED ORDER — OMEPRAZOLE 20 MG PO CPDR: 20.0000 mg | DELAYED_RELEASE_CAPSULE | Freq: Every day | ORAL | Status: DC

## 2014-05-30 MED ORDER — FLECAINIDE ACETATE 50 MG PO TABS: 50.0000 mg | ORAL_TABLET | Freq: Two times a day (BID) | ORAL | Status: DC

## 2014-06-23 ENCOUNTER — Other Ambulatory Visit: Payer: Self-pay | Admitting: Cardiology

## 2014-07-23 ENCOUNTER — Other Ambulatory Visit: Payer: Self-pay | Admitting: Cardiology

## 2014-08-03 ENCOUNTER — Other Ambulatory Visit: Payer: Self-pay | Admitting: Cardiology

## 2014-08-25 ENCOUNTER — Other Ambulatory Visit: Payer: Self-pay | Admitting: Cardiology

## 2014-09-02 ENCOUNTER — Other Ambulatory Visit: Payer: Self-pay | Admitting: Cardiology

## 2014-09-08 ENCOUNTER — Other Ambulatory Visit: Payer: Self-pay | Admitting: Cardiology

## 2014-09-15 ENCOUNTER — Other Ambulatory Visit: Payer: Self-pay | Admitting: Cardiology

## 2014-09-17 ENCOUNTER — Telehealth: Payer: Self-pay | Admitting: *Deleted

## 2014-09-17 ENCOUNTER — Other Ambulatory Visit: Payer: Self-pay

## 2014-09-17 MED ORDER — METOPROLOL SUCCINATE ER 25 MG PO TB24: 25.0000 mg | ORAL_TABLET | Freq: Every day | ORAL | Status: DC

## 2014-09-17 MED ORDER — FLECAINIDE ACETATE 50 MG PO TABS: 50.0000 mg | ORAL_TABLET | Freq: Two times a day (BID) | ORAL | Status: DC

## 2014-09-17 NOTE — Telephone Encounter (Signed)
Could you please call and schedule an appointment for this patient so that I can refill the medication requested by her pharmacy? Thanks, MI

## 2014-10-07 ENCOUNTER — Ambulatory Visit (INDEPENDENT_AMBULATORY_CARE_PROVIDER_SITE_OTHER): Payer: BLUE CROSS/BLUE SHIELD | Admitting: Physician Assistant

## 2014-10-07 ENCOUNTER — Encounter: Payer: Self-pay | Admitting: Physician Assistant

## 2014-10-07 VITALS — BP 120/70 | HR 69 | Ht 65.5 in

## 2014-10-07 DIAGNOSIS — R079 Chest pain, unspecified: Secondary | ICD-10-CM

## 2014-10-07 DIAGNOSIS — G4733 Obstructive sleep apnea (adult) (pediatric): Secondary | ICD-10-CM

## 2014-10-07 DIAGNOSIS — E669 Obesity, unspecified: Secondary | ICD-10-CM

## 2014-10-07 DIAGNOSIS — E785 Hyperlipidemia, unspecified: Secondary | ICD-10-CM | POA: Diagnosis not present

## 2014-10-07 DIAGNOSIS — I480 Paroxysmal atrial fibrillation: Secondary | ICD-10-CM

## 2014-10-07 DIAGNOSIS — I4892 Unspecified atrial flutter: Secondary | ICD-10-CM

## 2014-10-07 LAB — COMPREHENSIVE METABOLIC PANEL
ALK PHOS: 150 U/L — AB (ref 39–117)
ALT: 26 U/L (ref 0–35)
AST: 26 U/L (ref 0–37)
Albumin: 4.1 g/dL (ref 3.5–5.2)
BILIRUBIN TOTAL: 0.4 mg/dL (ref 0.2–1.2)
BUN: 12 mg/dL (ref 6–23)
CO2: 25 meq/L (ref 19–32)
Calcium: 8.9 mg/dL (ref 8.4–10.5)
Chloride: 107 mEq/L (ref 96–112)
Creatinine, Ser: 0.95 mg/dL (ref 0.40–1.20)
GFR: 66.37 mL/min (ref >60.00)
Glucose, Bld: 99 mg/dL (ref 70–99)
POTASSIUM: 3.9 meq/L (ref 3.5–5.1)
SODIUM: 138 meq/L (ref 135–145)
TOTAL PROTEIN: 7.1 g/dL (ref 6.0–8.3)

## 2014-10-07 LAB — CBC
HEMATOCRIT: 39.1 % (ref 36.0–46.0)
HEMOGLOBIN: 13.1 g/dL (ref 12.0–15.0)
MCHC: 33.5 g/dL (ref 30.0–36.0)
MCV: 88.4 fl (ref 78.0–100.0)
Platelets: 244 10*3/uL (ref 150.0–400.0)
RBC: 4.43 Mil/uL (ref 3.87–5.11)
RDW: 15.1 % (ref 11.5–15.5)
WBC: 7.2 10*3/uL (ref 4.0–10.5)

## 2014-10-07 LAB — TSH: TSH: 3.32 u[IU]/mL (ref 0.35–4.50)

## 2014-10-07 LAB — LIPID PANEL
Cholesterol: 147 mg/dL (ref 0–200)
HDL: 35.1 mg/dL — AB (ref >39.00)
LDL Cholesterol: 72 mg/dL (ref 0–99)
NonHDL: 111.9
TRIGLYCERIDES: 198 mg/dL — AB (ref 0.0–149.0)
Total CHOL/HDL Ratio: 4
VLDL: 39.6 mg/dL (ref 0.0–40.0)

## 2014-10-07 MED ORDER — FLECAINIDE ACETATE 50 MG PO TABS: 50.0000 mg | ORAL_TABLET | Freq: Two times a day (BID) | ORAL | Status: DC

## 2014-10-07 MED ORDER — ATORVASTATIN CALCIUM 10 MG PO TABS: 10.0000 mg | ORAL_TABLET | Freq: Every day | ORAL | Status: DC

## 2014-10-07 MED ORDER — OMEPRAZOLE 20 MG PO CPDR: 20.0000 mg | DELAYED_RELEASE_CAPSULE | Freq: Every day | ORAL | Status: DC

## 2014-10-07 MED ORDER — METOPROLOL SUCCINATE ER 25 MG PO TB24: 25.0000 mg | ORAL_TABLET | Freq: Every day | ORAL | Status: DC

## 2014-10-07 NOTE — Assessment & Plan Note (Addendum)
Patient declines sleep study. Discussed potential atrial arrhythmias with the patient.

## 2014-10-07 NOTE — Assessment & Plan Note (Signed)
I had along discussion with the patient concerning weight loss. Have recommended [facility] loss program or [employer] to help maintain weight loss. I suspect her swelling was secondary to weight gain. It has improved since she's lost 12 pounds. She is to call if she has any further swelling. Check TSH

## 2014-10-07 NOTE — Progress Notes (Signed)
Cardiology Office Note   Date:  10/07/2014   ID:  RIFKA RAMEY, DOB [birth year], MRN [id number]  PCP:  Gilford Rile, MD  Cardiologist:  Loralie Champagne, MD  Chief Complaint:    History of Present Illness: LOUIS GAW is a 49 y.o. female who presents for routine follow up. Has history of atrial flutter as well as atrial tachycardia and atrial fibrillation. 2-D echo in 03/2013 showed normal LV function. It was decided to put her on flecainide to keep her normal sinus rhythm. She had a Cardiolite that showed anterolateral ischemia. This was followed by a left heart cath that showed normal coronaries. She was started on flecainide and Toprol. ETT in 03/2013 for flecainide related abnormalities was normal. She was last seen by Dr. Algernon Huxley in 06/2013 at which time she was doing well. He recommended a sleep study which she never had.Chadvasc score is 1 for female, so no anticoagulation.  Patient has occasional palpitations maybe once every 2 months that lasts less than 5 minutes. She denies any chest pain, dyspnea, dizziness or presyncope. She has had significant weight gain. She gained over 27 pounds since last office visit but has lost 12 on a vegetarian diet. She does not exercise regularly. She says she started swelling all over her legs and arms recently but this has improved since she's changed her diet. Her blood pressure has never been elevated and is normal today. She does not eat a lot of salt since she is improved her diet. She is not interested in having a sleep study because she doesn't wear CPAP machine.    Past Medical History  Diagnosis Date  . Hyperlipidemia     a. Monitored by PCP - elected to pursue lifestyle modification in 09/2012 with reassessment pending.  . Chest pain     a. 03/2013: cath negative for CAD, no PE by CT angio.  . Paroxysmal atrial flutter     a. Dx 03/2013.  Marland Kitchen Atrial tachycardia     a. Dx 03/2013.  Marland Kitchen PAF (paroxysmal atrial fibrillation)     a. Dx 03/2013 -  started on flecainide. CHADSVASC = 1 so no anticoag at present time.  Marland Kitchen PAC (premature atrial contraction)     a. Dx 03/2013.  . Pulmonary nodule, right     a. 26mm RML by CT angio 03/2013.  Marland Kitchen Hyperglycemia     a. A1c 5.8 in 03/2013.    Past Surgical History  Procedure Laterality Date  . Appendectomy    . Cholecystectomy    . Hernia repair    . Left heart catheterization with coronary angiogram N/A 03/28/2013    Procedure: LEFT HEART CATHETERIZATION WITH CORONARY ANGIOGRAM;  Surgeon: Ramond Dial, MD;  Location: [facility] CATH LAB;  Service: Cardiovascular;  Laterality: N/A;     Current Outpatient Prescriptions  Medication Sig Dispense Refill  . aspirin EC 81 MG tablet Take 1 tablet (81 mg total) by mouth daily.    Marland Kitchen atorvastatin (LIPITOR) 10 MG tablet Take 10 mg by mouth daily.    . flecainide (TAMBOCOR) 50 MG tablet Take 1 tablet (50 mg total) by mouth every 12 (twelve) hours. 60 tablet 0  . metoprolol succinate (TOPROL-XL) 25 MG 24 hr tablet TAKE 1 TABLET EVERY DAY 7 tablet 0  . omeprazole (PRILOSEC) 20 MG capsule TAKE ONE CAPSULE BY MOUTH EVERY DAY 15 capsule 0   No current facility-administered medications for this visit.    Allergies:   Review of patient's allergies indicates no  known allergies.    Social History:  The patient  reports that she has never smoked. She has never used smokeless tobacco. She reports that she drinks alcohol. She reports that she does not use illicit drugs.   Family History:  The patient's family history includes Breast cancer in her sister; Colon cancer in her mother; Colon polyps in her sister; Diabetes in her paternal grandfather and paternal grandmother; Heart disease in an other family member; Rectal cancer in her mother; Stroke in her paternal grandfather and paternal grandmother.    ROS:  Please see the history of present illness.   Otherwise, review of systems are positive for none.   All other systems are reviewed and negative.     PHYSICAL EXAM: VS:  BP 120/70 mmHg  Pulse 69  Ht 5' 5.5" (1.664 m) , BMI There is no weight on file to calculate BMI. GEN: Well nourished, well developed, in no acute distress Neck: no JVD, HJR, carotid bruits, or masses Cardiac: RRR; no murmurs,gallop, rubs, thrill or heave,  Respiratory:  clear to auscultation bilaterally, normal work of breathing GI: soft, nontender, nondistended, + BS MS: no deformity or atrophy Extremities: without cyanosis, clubbing, edema, good distal pulses bilaterally.  Skin: warm and dry, no rash Neuro:  Strength and sensation are intact    EKG:  EKG is ordered today. The ekg ordered today demonstrates normal sinus rhythm normal EKG   Recent Labs: No results found for requested labs within last 365 days.    Lipid Panel    Component Value Date/Time   CHOL 169 03/27/2013 0336   TRIG 178* 03/27/2013 0336   HDL 35* 03/27/2013 0336   CHOLHDL 4.8 03/27/2013 0336   VLDL 36 03/27/2013 0336   LDLCALC 98 03/27/2013 0336      Wt Readings from Last 3 Encounters:  06/25/13 243 lb (110.224 kg)  04/08/13 235 lb 12.8 oz (106.958 kg)  03/26/13 200 lb (90.719 kg)      Other studies Reviewed: Additional studies/ records that were reviewed today include and review of the records demonstrates: 2-D echo 03/2013: Study Conclusions  - Left ventricle: Technically limited study. The cavity size   was normal. Wall thickness was increased in a pattern of   mild LVH. The estimated ejection fraction was 60%. Wall   motion was normal; there were no regional wall motion   abnormalities. - Pulmonary arteries: PA peak pressure: 40mm Hg (S).    ASSESSMENT AND PLAN:  Paroxysmal atrial flutter Patient has maintained normal sinus rhythm on flecainide and metoprolol. Chadsvasc equals 1 for gender. We'll check labs. Follow-up with Dr. Aundra Dubin in one year.  Obesity I had along discussion with the patient concerning weight loss. Have recommended [facility] Weight loss  program or Weight Watchers to help maintain weight loss. I suspect her swelling was secondary to weight gain. It has improved since she's lost 12 pounds. She is to call if she has any further swelling. Check TSH  Hyperlipidemia Check fasting lipid panel and LFTs. Refill Lipitor.  OSA (obstructive sleep apnea) Patient declines sleep study. Discussed potential atrial arrhythmias with the patient.    Sumner Boast, PA-C  10/07/2014 8:18 AM    [facility] [place], [place], [place]  [address] Phone: [phone]; Fax: [phone]

## 2014-10-07 NOTE — Assessment & Plan Note (Signed)
Patient has maintained normal sinus rhythm on flecainide and metoprolol. Chadsvasc equals 1 for gender. We'll check labs. Follow-up with Dr. Aundra Dubin in one year.

## 2014-10-07 NOTE — Addendum Note (Signed)
Addended by: Claude Manges on: 10/07/2014 08:58 AM   Modules accepted: Orders

## 2014-10-07 NOTE — Assessment & Plan Note (Signed)
Check fasting lipid panel and LFTs. Refill Lipitor.

## 2014-10-07 NOTE — Patient Instructions (Addendum)
Medication Instructions:   Your physician recommends that you continue on your current medications as directed. Please refer to the Current Medication list given to you today.   Labwork:   CMET CBC TSH LIPIDS   Testing/Procedures:   Follow-Up:  Your physician wants you to follow-up in:  [place] will receive a reminder letter in the mail two months in advance. If you don't receive a letter, please call our office to schedule the follow-up appointment.  Any Other Special Instructions Will Be Listed Below (If Applicable).

## 2014-10-18 ENCOUNTER — Other Ambulatory Visit: Payer: Self-pay | Admitting: Cardiology

## 2014-10-21 ENCOUNTER — Telehealth: Payer: Self-pay | Admitting: *Deleted

## 2014-10-21 NOTE — Telephone Encounter (Signed)
-----   Message from Imogene Burn, PA-C sent at 10/08/2014  1:13 PM EDT ----- Labs ok except for elevated triglycerides. Continue with low fat/low sugar diet. Repeat lipids and lft's in 6 wks.

## 2014-10-22 ENCOUNTER — Other Ambulatory Visit: Payer: Self-pay | Admitting: *Deleted

## 2014-10-22 ENCOUNTER — Telehealth: Payer: Self-pay | Admitting: *Deleted

## 2014-10-22 DIAGNOSIS — E785 Hyperlipidemia, unspecified: Secondary | ICD-10-CM

## 2014-10-22 NOTE — Telephone Encounter (Signed)
-----   Message from Imogene Burn, PA-C sent at 10/08/2014  1:13 PM EDT ----- Labs ok except for elevated triglycerides. Continue with low fat/low sugar diet. Repeat lipids and lft's in 6 wks.

## 2014-12-05 ENCOUNTER — Other Ambulatory Visit (INDEPENDENT_AMBULATORY_CARE_PROVIDER_SITE_OTHER): Payer: BLUE CROSS/BLUE SHIELD

## 2014-12-05 DIAGNOSIS — E785 Hyperlipidemia, unspecified: Secondary | ICD-10-CM | POA: Diagnosis not present

## 2014-12-05 LAB — LIPID PANEL
Cholesterol: 152 mg/dL (ref 0–200)
HDL: 32.3 mg/dL — AB (ref >39.00)
NonHDL: 119.82
TRIGLYCERIDES: 203 mg/dL — AB (ref 0.0–149.0)
Total CHOL/HDL Ratio: 5
VLDL: 40.6 mg/dL — ABNORMAL HIGH (ref 0.0–40.0)

## 2014-12-05 LAB — LDL CHOLESTEROL, DIRECT: Direct LDL: 87 mg/dL

## 2014-12-05 LAB — HEPATIC FUNCTION PANEL
ALT: 26 U/L (ref 0–35)
AST: 18 U/L (ref 0–37)
Albumin: 4.3 g/dL (ref 3.5–5.2)
Alkaline Phosphatase: 145 U/L — ABNORMAL HIGH (ref 39–117)
BILIRUBIN TOTAL: 0.3 mg/dL (ref 0.2–1.2)
Bilirubin, Direct: 0.1 mg/dL (ref 0.0–0.3)
TOTAL PROTEIN: 7.2 g/dL (ref 6.0–8.3)

## 2014-12-10 ENCOUNTER — Telehealth: Payer: Self-pay | Admitting: *Deleted

## 2014-12-10 NOTE — Telephone Encounter (Signed)
-----   Message from Imogene Burn, PA-C sent at 12/08/2014  7:47 AM EDT ----- Lipids ok. Triglycerides still high. Cut back on sugars in diet

## 2015-05-26 ENCOUNTER — Other Ambulatory Visit: Payer: Self-pay | Admitting: Cardiology

## 2015-06-22 ENCOUNTER — Other Ambulatory Visit: Payer: Self-pay | Admitting: Physician Assistant

## 2015-08-24 ENCOUNTER — Other Ambulatory Visit: Payer: Self-pay | Admitting: Cardiology

## 2015-09-22 ENCOUNTER — Other Ambulatory Visit: Payer: Self-pay | Admitting: Cardiology

## 2015-10-19 ENCOUNTER — Other Ambulatory Visit: Payer: Self-pay | Admitting: *Deleted

## 2015-10-19 MED ORDER — METOPROLOL SUCCINATE ER 25 MG PO TB24: 25.0000 mg | ORAL_TABLET | Freq: Every day | ORAL | Status: DC

## 2015-10-22 ENCOUNTER — Other Ambulatory Visit: Payer: Self-pay | Admitting: *Deleted

## 2015-10-22 MED ORDER — FLECAINIDE ACETATE 50 MG PO TABS: 50.0000 mg | ORAL_TABLET | Freq: Two times a day (BID) | ORAL | Status: DC

## 2015-10-22 NOTE — Telephone Encounter (Signed)
flecainide (TAMBOCOR) 50 MG tablet  Medication   Date: 09/23/2015  Department: [address] Office  Ordering/Authorizing: Larey Dresser, MD      Order Providers    Prescribing Provider Encounter Provider   Larey Dresser, MD Larey Dresser, MD    Medication Detail      Disp Refills Start End     flecainide [facility]) 50 MG tablet 60 tablet 0 09/23/2015     Sig - Route: Take 1 tablet (50 mg total) by mouth every 12 (twelve) hours. Please call and schedule an appointment - Oral    E-Prescribing Status: Receipt confirmed by pharmacy (09/23/2015 10:24 AM EDT)     Pharmacy    [id number] - [place], [address]   Sending 2nd attempt

## 2015-11-12 ENCOUNTER — Other Ambulatory Visit: Payer: Self-pay | Admitting: Cardiology

## 2015-12-08 ENCOUNTER — Other Ambulatory Visit: Payer: Self-pay | Admitting: Cardiology

## 2015-12-23 ENCOUNTER — Other Ambulatory Visit: Payer: Self-pay | Admitting: Cardiology

## 2016-01-06 ENCOUNTER — Other Ambulatory Visit: Payer: Self-pay | Admitting: Cardiology

## 2016-01-20 ENCOUNTER — Other Ambulatory Visit: Payer: Self-pay | Admitting: Cardiology

## 2016-01-26 ENCOUNTER — Other Ambulatory Visit: Payer: Self-pay | Admitting: Cardiology

## 2016-02-09 ENCOUNTER — Other Ambulatory Visit: Payer: Self-pay | Admitting: Cardiology

## 2016-02-22 ENCOUNTER — Other Ambulatory Visit: Payer: Self-pay | Admitting: Cardiology

## 2016-02-23 ENCOUNTER — Telehealth: Payer: Self-pay | Admitting: Cardiology

## 2016-02-23 NOTE — Telephone Encounter (Signed)
flecainide (TAMBOCOR) 50 MG tablet  Medication  Date: 02/23/2016 Department: [address] Office Ordering/Authorizing: Larey Dresser, MD  Order Providers   Prescribing Provider Encounter Provider  Larey Dresser, MD Larey Dresser, MD  Medication Detail    Disp Refills Start End   flecainide [facility]) 50 MG tablet 60 tablet 0 02/23/2016    Sig - Route: Take 1 tablet (50 mg total) by mouth every 12 (twelve) hours. *Please keep 02/29/16 appointment for further refills* - Oral   E-Prescribing Status: Receipt confirmed by pharmacy (02/23/2016 5:03 PM EST)   Pharmacy   [id number] - [place], [address]

## 2016-02-23 NOTE — Telephone Encounter (Signed)
New message        *STAT* If patient is at the pharmacy, call can be transferred to refill team.   1. Which medications need to be refilled? (please list name of each medication and dose if known)  Flecainide 50mg   2. Which pharmacy/location (including street and city if local pharmacy) is medication to be sent to[facility] [address] [facility] [place] [address] Do they need a 30 day or 90 day supply?  Need enough until appt on monday

## 2016-02-29 ENCOUNTER — Encounter: Payer: Self-pay | Admitting: Physician Assistant

## 2016-02-29 ENCOUNTER — Ambulatory Visit (INDEPENDENT_AMBULATORY_CARE_PROVIDER_SITE_OTHER): Payer: Self-pay | Admitting: Physician Assistant

## 2016-02-29 VITALS — BP 120/70 | HR 76 | Ht 66.0 in | Wt 272.8 lb

## 2016-02-29 DIAGNOSIS — E6609 Other obesity due to excess calories: Secondary | ICD-10-CM

## 2016-02-29 DIAGNOSIS — Z6841 Body Mass Index (BMI) 40.0 and over, adult: Secondary | ICD-10-CM

## 2016-02-29 DIAGNOSIS — IMO0001 Reserved for inherently not codable concepts without codable children: Secondary | ICD-10-CM

## 2016-02-29 DIAGNOSIS — E785 Hyperlipidemia, unspecified: Secondary | ICD-10-CM

## 2016-02-29 DIAGNOSIS — I480 Paroxysmal atrial fibrillation: Secondary | ICD-10-CM

## 2016-02-29 MED ORDER — FLECAINIDE ACETATE 50 MG PO TABS: 50.0000 mg | ORAL_TABLET | Freq: Two times a day (BID) | ORAL | 11 refills | Status: DC

## 2016-02-29 MED ORDER — OMEPRAZOLE 20 MG PO CPDR: 20.0000 mg | DELAYED_RELEASE_CAPSULE | Freq: Every day | ORAL | 11 refills | Status: DC

## 2016-02-29 MED ORDER — METOPROLOL SUCCINATE ER 25 MG PO TB24: 25.0000 mg | ORAL_TABLET | Freq: Every day | ORAL | 11 refills | Status: DC

## 2016-02-29 NOTE — Progress Notes (Signed)
Cardiology Office Note    Date:  02/29/2016   ID:  ALEKSIS THRUN, DOB 1966/02/15, MRN [id number]  PCP:  Gilford Rile, MD  Cardiologist: Dr. Aundra Dubin  No chief complaint on file.   History of Present Illness:  Sandra Stark is a 50 y.o. female with a history of atrial flutter as well as atrial tachycardia and atrial fibrillation.Also has OSA but doesn't wear CPAP and HLD on Lipitor. 2-D echo in 03/2013 showed normal LV function. It was decided to put her on flecainide to keep her normal sinus rhythm. She had a Cardiolite that showed anterolateral ischemia. This was followed by a left heart cath that showed normal coronaries. She was started on flecainide and Toprol. ETT in 03/2013 for flecainide related abnormalities was normal. She was last seen by myself 09/2014.   Patient comes in today for yearly follow-up. Her pharmacy ran out of flecainide in August and she missed 1 day of her medication. She said she had a lot of palpitations at a couple days for her to get back to normal. She hasn't had any trouble since then. She denies chest pain, palpitations, dyspnea, dyspnea on exertion, dizziness or presyncope. She stopped her Lipitor a long time ago and doesn't want to take anything for her cholesterol. Her cholesterol has been fine over the past couple years. She has gained weight and does not exercise.    Past Medical History:  Diagnosis Date  . Atrial tachycardia ([place])    a. Dx 03/2013.  . Chest pain    a. 03/2013: cath negative for CAD, no PE by CT angio.  Marland Kitchen Hyperglycemia    a. A1c 5.8 in 03/2013.  Marland Kitchen Hyperlipidemia    a. Monitored by PCP - elected to pursue lifestyle modification in 09/2012 with reassessment pending.  Marland Kitchen PAC (premature atrial contraction)    a. Dx 03/2013.  Marland Kitchen PAF (paroxysmal atrial fibrillation) ([place])    a. Dx 03/2013 - started on flecainide. CHADSVASC = 1 so no anticoag at present time.  . Paroxysmal atrial flutter ([place])    a. Dx 03/2013.  . Pulmonary nodule,  right    a. 66mm RML by CT angio 03/2013.    Past Surgical History:  Procedure Laterality Date  . APPENDECTOMY    . CHOLECYSTECTOMY    . HERNIA REPAIR    . LEFT HEART CATHETERIZATION WITH CORONARY ANGIOGRAM N/A 03/28/2013   Procedure: LEFT HEART CATHETERIZATION WITH CORONARY ANGIOGRAM;  Surgeon: Ramond Dial, MD;  Location: [facility] CATH LAB;  Service: Cardiovascular;  Laterality: N/A;    Current Medications: Outpatient Medications Prior to Visit  Medication Sig Dispense Refill  . aspirin EC 81 MG tablet Take 1 tablet (81 mg total) by mouth daily.    . flecainide (TAMBOCOR) 50 MG tablet Take 1 tablet (50 mg total) by mouth every 12 (twelve) hours. *Please keep 02/29/16 appointment for further refills* 60 tablet 0  . metoprolol succinate (TOPROL-XL) 25 MG 24 hr tablet Take 1 tablet (25 mg total) by mouth daily. **Patient is overdue for an appointment and must call and schedule for further refills.** 15 tablet 0  . omeprazole (PRILOSEC) 20 MG capsule Take 1 capsule (20 mg total) by mouth daily. 30 capsule 6  . atorvastatin (LIPITOR) 10 MG tablet Take 1 tablet (10 mg total) by mouth daily. (Patient not taking: Reported on 02/29/2016) 30 tablet 6   No facility-administered medications prior to visit.      Allergies:   Patient has no known allergies.  Social History   Social History  . Marital status: Married    Spouse name: N/A  . Number of children: 2  . Years of education: N/A   Occupational History  .  [employer]    Adminstrative assistance   Social History Main Topics  . Smoking status: Never Smoker  . Smokeless tobacco: Never Used  . Alcohol use Yes     Comment: rare - 2x/month  . Drug use: No  . Sexual activity: Yes   Other Topics Concern  . None   Social History Narrative  . None     Family History:  The patient's family history includes Breast cancer in her sister; Colon cancer in her mother; Colon polyps in her sister; Diabetes in her paternal  grandfather and paternal grandmother; Rectal cancer in her mother; Stroke in her paternal grandfather and paternal grandmother.   ROS:   Please see the history of present illness.    Review of Systems  Constitution: Positive for weight gain.  HENT: Negative.   Eyes: Negative.   Cardiovascular: Positive for irregular heartbeat.  Respiratory: Negative.   Hematologic/Lymphatic: Negative.   Musculoskeletal: Negative.  Negative for joint pain.  Gastrointestinal: Negative.   Genitourinary: Negative.   Neurological: Negative.    All other systems reviewed and are negative.   PHYSICAL EXAM:   VS:  BP 120/70   Pulse 76   Ht 5\' 6"  (1.676 m)   Wt 272 lb 12.8 oz (123.7 kg)   BMI 44.03 kg/m   Physical Exam  GEN: Obese, in no acute distress  Neck: no JVD, carotid bruits, or masses Cardiac:RRR; distant heart sounds no murmurs, rubs, or gallops  Respiratory:  clear to auscultation bilaterally, normal work of breathing GI: soft, nontender, nondistended, + BS Ext: without cyanosis, clubbing, or edema, Good distal pulses bilaterally MS: no deformity or atrophy  Skin: warm and dry, no rash Psych: euthymic mood, full affect  Wt Readings from Last 3 Encounters:  02/29/16 272 lb 12.8 oz (123.7 kg)  06/25/13 243 lb (110.2 kg)  04/08/13 235 lb 12.8 oz (107 kg)      Studies/Labs Reviewed:   EKG:  EKG is ordered today.  The ekg ordered today demonstrates Normal sinus rhythm, normal EKG  Recent Labs: No results found for requested labs within last 8760 hours.   Lipid Panel    Component Value Date/Time   CHOL 152 12/05/2014 1216   TRIG 203.0 (H) 12/05/2014 1216   HDL 32.30 (L) 12/05/2014 1216   CHOLHDL 5 12/05/2014 1216   VLDL 40.6 (H) 12/05/2014 1216   LDLCALC 72 10/07/2014 0838   LDLDIRECT 87.0 12/05/2014 1216    Additional studies/ records that were reviewed today include:    2-D echo 03/2013: Study Conclusions  - Left ventricle: Technically limited study. The cavity size    was normal. Wall thickness was increased in a pattern of   mild LVH. The estimated ejection fraction was 60%. Wall   motion was normal; there were no regional wall motion   abnormalities. - Pulmonary arteries: PA peak pressure: 55mm Hg (S).     ASSESSMENT:    1. PAF (paroxysmal atrial fibrillation) (HCC)   2. Class 3 obesity due to excess calories with body mass index (BMI) of 40.0 to 44.9 in adult, unspecified whether serious comorbidity present ([place])   3. Hyperlipidemia, unspecified hyperlipidemia type      PLAN:  In order of problems listed above:  PAF maintaining normal sinus rhythm on flecainide  and metoprolol. CHADSVASC=1 for female so no anticoagulation  Obesity along discussion with patient concerning diet and exercise. Recommend weight loss program.   Hyperlipidemia patient's cholesterol is been fine in the past she is mostly had high triglycerides. She does not want to take any thing for her lipids. Recommend weight loss program, diet and exercise.    Medication Adjustments/Labs and Tests Ordered: Current medicines are reviewed at length with the patient today.  Concerns regarding medicines are outlined above.  Medication changes, Labs and Tests ordered today are listed in the Patient Instructions below. Patient Instructions  Medication Instructions:  Your physician recommends that you continue on your current medications as directed. Please refer to the Current Medication list given to you today. 1. All medications were filled at office visit today with one year supply.   Labwork: -None  Testing/Procedures:   Follow-Up: Your physician wants you to follow-up in: 1 year with Dr. Curt Bears.  You will receive a reminder letter in the mail two months in advance. If you don't receive a letter, please call our office to schedule the follow-up appointment.     Any Other Special Instructions Will Be Listed Below (If Applicable).     If you need a refill on your  cardiac medications before your next appointment, please call your pharmacy.      Sumner Boast, PA-C  02/29/2016 9:42 AM    [facility] [place], [place], [place]  [address] Phone: [phone]; Fax: [phone]

## 2016-02-29 NOTE — Patient Instructions (Signed)
Medication Instructions:  Your physician recommends that you continue on your current medications as directed. Please refer to the Current Medication list given to you today. 1. All medications were filled at office visit today with one year supply.   Labwork: -None  Testing/Procedures:   Follow-Up: Your physician wants you to follow-up in: 1 year with Dr. Curt Bears.  You will receive a reminder letter in the mail two months in advance. If you don't receive a letter, please call our office to schedule the follow-up appointment.     Any Other Special Instructions Will Be Listed Below (If Applicable).     If you need a refill on your cardiac medications before your next appointment, please call your pharmacy.

## 2017-01-15 ENCOUNTER — Other Ambulatory Visit: Payer: Self-pay | Admitting: Physician Assistant

## 2017-01-15 DIAGNOSIS — I480 Paroxysmal atrial fibrillation: Secondary | ICD-10-CM

## 2017-03-20 ENCOUNTER — Other Ambulatory Visit: Payer: Self-pay | Admitting: Physician Assistant

## 2017-03-20 DIAGNOSIS — I480 Paroxysmal atrial fibrillation: Secondary | ICD-10-CM

## 2017-04-17 ENCOUNTER — Other Ambulatory Visit: Payer: Self-pay | Admitting: Cardiology

## 2017-04-17 DIAGNOSIS — I480 Paroxysmal atrial fibrillation: Secondary | ICD-10-CM

## 2017-04-21 ENCOUNTER — Other Ambulatory Visit: Payer: Self-pay | Admitting: Cardiology

## 2017-04-21 DIAGNOSIS — I480 Paroxysmal atrial fibrillation: Secondary | ICD-10-CM

## 2017-04-25 ENCOUNTER — Telehealth: Payer: Self-pay | Admitting: Physician Assistant

## 2017-04-25 DIAGNOSIS — I480 Paroxysmal atrial fibrillation: Secondary | ICD-10-CM

## 2017-04-25 MED ORDER — METOPROLOL SUCCINATE ER 25 MG PO TB24: 25.0000 mg | ORAL_TABLET | Freq: Every day | ORAL | 0 refills | Status: DC

## 2017-04-25 NOTE — Telephone Encounter (Signed)
New message    *STAT* If patient is at the pharmacy, call can be transferred to refill team.   1. Which medications need to be refilled? (please list name of each medication and dose if known) metoprolol succinate (TOPROL-XL) 25 MG 24 hr tablet  2. Which pharmacy/location (including street and city if local pharmacy) is [facility] to[facility] [address] [place]  3. Do they need a 30 day or 90 day supply?

## 2017-04-25 NOTE — Telephone Encounter (Signed)
Pt's medication was sent to pt's pharmacy as requested. Confirmation received.  °

## 2017-05-12 ENCOUNTER — Other Ambulatory Visit: Payer: Self-pay | Admitting: Physician Assistant

## 2017-05-12 DIAGNOSIS — I480 Paroxysmal atrial fibrillation: Secondary | ICD-10-CM

## 2017-05-12 MED ORDER — FLECAINIDE ACETATE 50 MG PO TABS: 50.0000 mg | ORAL_TABLET | Freq: Two times a day (BID) | ORAL | 0 refills | Status: DC

## 2017-05-16 ENCOUNTER — Ambulatory Visit: Payer: Self-pay | Admitting: Cardiology

## 2017-06-02 ENCOUNTER — Other Ambulatory Visit: Payer: Self-pay | Admitting: Physician Assistant

## 2017-06-02 DIAGNOSIS — I480 Paroxysmal atrial fibrillation: Secondary | ICD-10-CM

## 2017-06-06 ENCOUNTER — Ambulatory Visit: Payer: Self-pay | Admitting: Cardiology

## 2017-06-15 NOTE — Progress Notes (Signed)
Electrophysiology Office Note   Date:  06/16/2017   ID:  Sandra Stark, DOB [birth year], MRN [id number]  PCP:  Raina Mina., MD  Cardiologist:  Aundra Dubin Primary Electrophysiologist:  Jakiah Goree Meredith Leeds, MD    Chief Complaint  Patient presents with  . New Patient (Initial Visit)    PAF     History of Present Illness: Sandra Stark is a 52 y.o. female who is being seen today for the evaluation of atrial flutter/fibrillation at the request of Larey Dresser, MD. Presenting today for electrophysiology evaluation.  She has a history of atrial flutter, atrial fibrillation, atrial tachycardia, and sleep apnea.  Most recent echo in 2014 showed normal LV function.  She was put on flecainide.  She had a stress test that showed anterolateral ischemia followed by left heart cath that showed normal coronary arteries.  Today, she denies symptoms of palpitations, chest pain, shortness of breath, orthopnea, PND, lower extremity edema, claudication, dizziness, presyncope, syncope, bleeding, or neurologic sequela. The patient is tolerating medications without difficulties.  He has been doing well overall.  Has been compliant with her metoprolol and flecainide.  She does say that when she takes her flecainide off schedule, she has significant amounts of palpitations.  When she does take her flecainide as scheduled, she feels well without complaint.   Past Medical History:  Diagnosis Date  . Atrial tachycardia ([place])    a. Dx 03/2013.  . Chest pain    a. 03/2013: cath negative for CAD, no PE by CT angio.  Marland Kitchen Hyperglycemia    a. A1c 5.8 in 03/2013.  Marland Kitchen Hyperlipidemia    a. Monitored by PCP - elected to pursue lifestyle modification in 09/2012 with reassessment pending.  Marland Kitchen PAC (premature atrial contraction)    a. Dx 03/2013.  Marland Kitchen PAF (paroxysmal atrial fibrillation) ([place])    a. Dx 03/2013 - started on flecainide. CHADSVASC = 1 so no anticoag at present time.  . Paroxysmal atrial flutter ([place])    a.  Dx 03/2013.  . Pulmonary nodule, right    a. 33mm RML by CT angio 03/2013.   Past Surgical History:  Procedure Laterality Date  . APPENDECTOMY    . CHOLECYSTECTOMY    . HERNIA REPAIR    . LEFT HEART CATHETERIZATION WITH CORONARY ANGIOGRAM N/A 03/28/2013   Procedure: LEFT HEART CATHETERIZATION WITH CORONARY ANGIOGRAM;  Surgeon: Ramond Dial, MD;  Location: [facility] CATH LAB;  Service: Cardiovascular;  Laterality: N/A;     Current Outpatient Medications  Medication Sig Dispense Refill  . flecainide (TAMBOCOR) 50 MG tablet Take 1 tablet (50 mg total) by mouth every 12 (twelve) hours. Please keep upcoming appt for future refills. Thank you 60 tablet 0  . metoprolol succinate (TOPROL-XL) 25 MG 24 hr tablet TAKE 1 TABLET (25 MG TOTAL) BY MOUTH DAILY. PLEASE KEEP UPCOMING APPT FOR FUTURE REFILL. THANK YOU 30 tablet 0   No current facility-administered medications for this visit.     Allergies:   Patient has no known allergies.   Social History:  The patient  reports that  has never smoked. she has never used smokeless tobacco. She reports that she drinks alcohol. She reports that she does not use drugs.   Family History:  The patient's family history includes Breast cancer in her sister; Colon cancer in her mother; Colon polyps in her sister; Diabetes in her paternal grandfather and paternal grandmother; Heart disease in her unknown relative; Rectal cancer in her mother; Stroke in  her paternal grandfather and paternal grandmother.    ROS:  Please see the history of present illness.   Otherwise, review of systems is positive for shortness of breath, palpitations, snoring.   All other systems are reviewed and negative.    PHYSICAL EXAM: VS:  BP 128/84   Pulse 82   Ht 5' 4.5" (1.638 m)   Wt 270 lb (122.5 kg)   BMI 45.63 kg/m  , BMI Body mass index is 45.63 kg/m. GEN: Well nourished, well developed, in no acute distress  HEENT: normal  Neck: no JVD, carotid bruits, or masses Cardiac:  RRR; no murmurs, rubs, or gallops,no edema  Respiratory:  clear to auscultation bilaterally, normal work of breathing GI: soft, nontender, nondistended, + BS MS: no deformity or atrophy  Skin: warm and dry Neuro:  Strength and sensation are intact Psych: euthymic mood, full affect  EKG:  EKG is ordered today. Personal review of the ekg ordered shows sinus rhythm, rate 82  Recent Labs: No results found for requested labs within last 8760 hours.    Lipid Panel     Component Value Date/Time   CHOL 152 12/05/2014 1216   TRIG 203.0 (H) 12/05/2014 1216   HDL 32.30 (L) 12/05/2014 1216   CHOLHDL 5 12/05/2014 1216   VLDL 40.6 (H) 12/05/2014 1216   LDLCALC 72 10/07/2014 0838   LDLDIRECT 87.0 12/05/2014 1216     Wt Readings from Last 3 Encounters:  06/16/17 270 lb (122.5 kg)  02/29/16 272 lb 12.8 oz (123.7 kg)  06/25/13 243 lb (110.2 kg)      Other studies Reviewed: Additional studies/ records that were reviewed today include: TTE 2014  Review of the above records today demonstrates:  - Left ventricle: Technically limited study. The cavity size was normal. Wall thickness was increased in a pattern of mild LVH. The estimated ejection fraction was 60%. Wall motion was normal; there were no regional wall motion abnormalities. - Pulmonary arteries: PA peak pressure: 36mm Hg (S).   ASSESSMENT AND PLAN:  1.  Paroxysmal atrial fibrillation/atrial flutter/atrial tachycardia: Currently on flecainide and metoprolol.  No anticoagulation due to low stroke risk.  She currently feels well without major complaint.  She does have fatigue which could be related to sleep apnea.  Should sleep apnea be treated and she continued to have fatigue, she may benefit from switch from metoprolol to diltiazem.  This patients CHA2DS2-VASc Score and unadjusted Ischemic Stroke Rate (% per year) is equal to 0.6 % stroke rate/year from a score of 1  Above score calculated as 1 point each if present  [CHF, HTN, DM, Vascular=MI/PAD/Aortic Plaque, Age if 65-74, or Female] Above score calculated as 2 points each if present [Age > 75, or Stroke/TIA/TE]  2.  Hyperlipidemia: Currently diet controlled.  No changes.  3.  Snoring: Has not yet been tested for sleep apnea.  She has daytime fatigue and her daughters told her that she stops breathing when she sleeps.  We Malloree Raboin plan for sleep study.  4.  Morbid obesity: Weight loss encouraged  Current medicines are reviewed at length with the patient today.   The patient does not have concerns regarding her medicines.  The following changes were made today:  none  Labs/ tests ordered today include:  Orders Placed This Encounter  Procedures  . EKG 12-Lead  . Split night study     Disposition:   FU with Colbie Danner 3 months  Signed, Yomara Toothman Meredith Leeds, MD  06/16/2017 9:34 AM  [place] [address] [phone] (office) [phone] (fax)

## 2017-06-16 ENCOUNTER — Telehealth: Payer: Self-pay | Admitting: *Deleted

## 2017-06-16 ENCOUNTER — Ambulatory Visit (INDEPENDENT_AMBULATORY_CARE_PROVIDER_SITE_OTHER): Payer: Self-pay | Admitting: Cardiology

## 2017-06-16 ENCOUNTER — Encounter (INDEPENDENT_AMBULATORY_CARE_PROVIDER_SITE_OTHER): Payer: Self-pay

## 2017-06-16 ENCOUNTER — Encounter: Payer: Self-pay | Admitting: Cardiology

## 2017-06-16 VITALS — BP 128/84 | HR 82 | Ht 64.5 in | Wt 270.0 lb

## 2017-06-16 DIAGNOSIS — R0683 Snoring: Secondary | ICD-10-CM

## 2017-06-16 DIAGNOSIS — I480 Paroxysmal atrial fibrillation: Secondary | ICD-10-CM

## 2017-06-16 DIAGNOSIS — R0681 Apnea, not elsewhere classified: Secondary | ICD-10-CM

## 2017-06-16 DIAGNOSIS — I483 Typical atrial flutter: Secondary | ICD-10-CM

## 2017-06-16 DIAGNOSIS — I471 Supraventricular tachycardia: Secondary | ICD-10-CM

## 2017-06-16 DIAGNOSIS — R400 Somnolence: Secondary | ICD-10-CM

## 2017-06-16 MED ORDER — METOPROLOL SUCCINATE ER 25 MG PO TB24: 25.0000 mg | ORAL_TABLET | Freq: Every day | ORAL | 2 refills | Status: DC

## 2017-06-16 MED ORDER — FLECAINIDE ACETATE 50 MG PO TABS: 50.0000 mg | ORAL_TABLET | Freq: Two times a day (BID) | ORAL | 2 refills | Status: DC

## 2017-06-16 NOTE — Patient Instructions (Addendum)
Medication Instructions: Your physician has recommended you make the following change in your medication:  1. STOP Aspirin  Labwork: None ordered  Procedures/Testing: Your physician has recommended that you have a sleep study. This test records several body functions during sleep, including: brain activity, eye movement, oxygen and carbon dioxide blood levels, heart rate and rhythm, breathing rate and rhythm, the flow of air through your mouth and nose, snoring, body muscle movements, and chest and belly movement.  The office will call you to arrange this   Follow-Up: Your physician recommends that you schedule a follow-up appointment in: 3 months with Dr. Curt Bears.   Any Additional Special Instructions Will Be Listed Below (If Applicable).  If you need a refill on your cardiac medications before your next appointment, please call your pharmacy.

## 2017-06-16 NOTE — Telephone Encounter (Signed)
Sent to pre cert in sleep pool

## 2017-06-16 NOTE — Telephone Encounter (Signed)
-----   Message from Stanton Kidney, RN sent at 06/16/2017  9:47 AM EST ----- Regarding: Sleep Study Please arrange sleep study Dx: Snores, Apnea, Somnolence, Obesity  Epworth Sleepiness Scale: 1.   3 2.   2 3.   0 4.   0 5.   3 6.   0 7.   0 8.   0 ----------------- Total = 8

## 2017-07-14 ENCOUNTER — Telehealth: Payer: Self-pay | Admitting: *Deleted

## 2017-07-14 NOTE — Telephone Encounter (Signed)
-----   Message from Almyra Free sent at 07/07/2017  3:18 PM EDT ----- Regarding: RE: pre cert No insurance listed in the system. Pt will need to get [place] her insurance info or self pay. Thanks ----- Message ----- From: Freada Bergeron, CMA Sent: 06/16/2017   4:16 PM To: [facility] Sleep Studies Subject: pre cert                                         ----- Message ----- From: Stanton Kidney, RN Sent: 06/16/2017   9:47 AM To: Stanton Kidney, RN, Freada Bergeron, CMA Subject: Sleep Study                                    Please arrange sleep study Dx: Snores, Apnea, Somnolence, Obesity  Epworth Sleepiness Scale: 1.   3 2.   2 3.   0 4.   0 5.   3 6.   0 7.   0 8.   0 ----------------- Total = 8

## 2017-07-14 NOTE — Telephone Encounter (Signed)
Called patient to inform her that we needed other insurance information or she would have to self pay but there was no room to leave a voicemail.

## 2017-07-19 NOTE — Telephone Encounter (Signed)
RE: pre cert  Sandra Stark, CMA        No insurance listed in the system. Pt will need to get [place] her insurance info or self pay.  Thanks

## 2017-07-28 NOTE — Telephone Encounter (Signed)
Called patient mailbox is full can not leave a message.

## 2017-08-10 NOTE — Telephone Encounter (Signed)
Forwarding to sleep study assistant. Gae Bon, please let me know where we are with this. This was ordered on 3/1. Thanks

## 2017-08-11 ENCOUNTER — Encounter: Payer: Self-pay | Admitting: *Deleted

## 2017-08-11 NOTE — Telephone Encounter (Signed)
Reached out to the patient again today and lmtcb. I also mailed a no contact letter to patient.

## 2017-09-20 ENCOUNTER — Ambulatory Visit: Payer: Self-pay | Admitting: Cardiology

## 2017-12-31 ENCOUNTER — Other Ambulatory Visit: Payer: Self-pay | Admitting: Cardiology

## 2017-12-31 DIAGNOSIS — I480 Paroxysmal atrial fibrillation: Secondary | ICD-10-CM

## 2018-04-01 ENCOUNTER — Other Ambulatory Visit: Payer: Self-pay | Admitting: Cardiology

## 2018-04-01 DIAGNOSIS — I480 Paroxysmal atrial fibrillation: Secondary | ICD-10-CM

## 2018-07-05 ENCOUNTER — Other Ambulatory Visit: Payer: Self-pay

## 2018-07-05 DIAGNOSIS — I480 Paroxysmal atrial fibrillation: Secondary | ICD-10-CM

## 2018-07-05 MED ORDER — METOPROLOL SUCCINATE ER 25 MG PO TB24: 25.0000 mg | ORAL_TABLET | Freq: Every day | ORAL | 0 refills | Status: DC

## 2018-09-27 ENCOUNTER — Other Ambulatory Visit: Payer: Self-pay | Admitting: Cardiology

## 2018-09-27 DIAGNOSIS — I480 Paroxysmal atrial fibrillation: Secondary | ICD-10-CM

## 2018-10-11 ENCOUNTER — Other Ambulatory Visit: Payer: Self-pay | Admitting: Cardiology

## 2018-10-11 DIAGNOSIS — I480 Paroxysmal atrial fibrillation: Secondary | ICD-10-CM

## 2018-10-15 ENCOUNTER — Other Ambulatory Visit: Payer: Self-pay | Admitting: Cardiology

## 2018-10-15 DIAGNOSIS — I480 Paroxysmal atrial fibrillation: Secondary | ICD-10-CM

## 2018-10-17 ENCOUNTER — Other Ambulatory Visit: Payer: Self-pay | Admitting: Cardiology

## 2018-10-17 DIAGNOSIS — I480 Paroxysmal atrial fibrillation: Secondary | ICD-10-CM

## 2018-10-17 MED ORDER — FLECAINIDE ACETATE 50 MG PO TABS: 50.0000 mg | ORAL_TABLET | Freq: Two times a day (BID) | ORAL | 0 refills | Status: DC

## 2018-10-22 ENCOUNTER — Other Ambulatory Visit: Payer: Self-pay | Admitting: Cardiology

## 2018-10-22 DIAGNOSIS — I480 Paroxysmal atrial fibrillation: Secondary | ICD-10-CM

## 2018-11-20 ENCOUNTER — Other Ambulatory Visit: Payer: Self-pay | Admitting: Cardiology

## 2018-11-20 DIAGNOSIS — I480 Paroxysmal atrial fibrillation: Secondary | ICD-10-CM

## 2018-12-21 ENCOUNTER — Other Ambulatory Visit: Payer: Self-pay | Admitting: Cardiology

## 2018-12-21 DIAGNOSIS — I480 Paroxysmal atrial fibrillation: Secondary | ICD-10-CM

## 2018-12-25 NOTE — Telephone Encounter (Signed)
Attempted to reach pt to discuss.  No answer, only busy signal. Attempted several times.

## 2018-12-26 ENCOUNTER — Encounter: Payer: Self-pay | Admitting: *Deleted

## 2018-12-26 NOTE — Telephone Encounter (Signed)
After many unsuccessful attempts from office staff to reach pt --  will mail letter to pt's home address asking her to call office to discuss refill needs & OV f/u need w/ [place].

## 2018-12-27 DIAGNOSIS — K859 Acute pancreatitis without necrosis or infection, unspecified: Secondary | ICD-10-CM

## 2018-12-27 DIAGNOSIS — R109 Unspecified abdominal pain: Secondary | ICD-10-CM

## 2018-12-29 ENCOUNTER — Other Ambulatory Visit: Payer: Self-pay | Admitting: Cardiology

## 2018-12-29 DIAGNOSIS — I480 Paroxysmal atrial fibrillation: Secondary | ICD-10-CM

## 2019-01-01 ENCOUNTER — Telehealth: Payer: Self-pay | Admitting: Cardiology

## 2019-01-01 ENCOUNTER — Other Ambulatory Visit: Payer: Self-pay | Admitting: Cardiology

## 2019-01-01 DIAGNOSIS — I480 Paroxysmal atrial fibrillation: Secondary | ICD-10-CM

## 2019-01-01 NOTE — Telephone Encounter (Signed)
New Message       *STAT* If patient is at the pharmacy, call can be transferred to refill team.   1. Which medications need to be refilled? (please list name of each medication and dose if known) Metoprolol, Flecainide   2. Which pharmacy/location (including street and city if local pharmacy) is medication to be sent to? [facility] [place]   3. Do they need a 30 day or 90 day supply? [place]

## 2019-01-01 NOTE — Telephone Encounter (Signed)
Pt calling requesting a refill on metoprolol and flecainide. Pt has not been seen since 06/2017. Pt has an appt in December. Would Dr Curt Bears like to refill these medications until pt comes in for OV in December? Did not know if Dr. Curt Bears wanted to see pt sooner. Please address

## 2019-01-02 ENCOUNTER — Other Ambulatory Visit: Payer: Self-pay

## 2019-01-02 DIAGNOSIS — I480 Paroxysmal atrial fibrillation: Secondary | ICD-10-CM

## 2019-01-02 MED ORDER — FLECAINIDE ACETATE 50 MG PO TABS: 50.0000 mg | ORAL_TABLET | Freq: Two times a day (BID) | ORAL | 0 refills | Status: DC

## 2019-01-02 NOTE — Telephone Encounter (Signed)
Per Dr. Curt Bears - ok to refill both medications.  Give her refills to last her to the end of the year. Please call pt to inform her that she must make December appt for future refills beyond the end of this year. Thanks

## 2019-01-02 NOTE — Telephone Encounter (Signed)
Called pt, busy signal, pt's medications were sent to pt's pharmacy as requested. Confirmation received.

## 2019-01-02 NOTE — Telephone Encounter (Signed)
Dr. Curt Bears - are you ok refilling requested medications until end of year? Pt has seen you one time 06/2017 to establish w/ EP.  She was to follow up in 3 months but cancelled d/t insurance issues.  She has been taking Flecainide since 2014. Please advise

## 2019-01-03 DIAGNOSIS — I4811 Longstanding persistent atrial fibrillation: Secondary | ICD-10-CM

## 2019-01-03 HISTORY — DX: Longstanding persistent atrial fibrillation: I48.11

## 2019-03-19 ENCOUNTER — Encounter (INDEPENDENT_AMBULATORY_CARE_PROVIDER_SITE_OTHER): Payer: Self-pay

## 2019-03-19 ENCOUNTER — Other Ambulatory Visit: Payer: Self-pay

## 2019-03-19 ENCOUNTER — Encounter: Payer: Self-pay | Admitting: Cardiology

## 2019-03-19 ENCOUNTER — Ambulatory Visit (INDEPENDENT_AMBULATORY_CARE_PROVIDER_SITE_OTHER): Payer: Self-pay | Admitting: Cardiology

## 2019-03-19 VITALS — BP 136/84 | HR 83 | Ht 64.5 in | Wt 288.2 lb

## 2019-03-19 DIAGNOSIS — R55 Syncope and collapse: Secondary | ICD-10-CM

## 2019-03-19 DIAGNOSIS — I480 Paroxysmal atrial fibrillation: Secondary | ICD-10-CM

## 2019-03-19 NOTE — Progress Notes (Signed)
Electrophysiology Office Note   Date:  03/19/2019   ID:  Sandra Stark, Sandra Stark 25-Dec-1965, MRN [id number]  PCP:  Raina Mina., MD  Cardiologist:  Aundra Dubin Primary Electrophysiologist:   Meredith Leeds, MD    No chief complaint on file.    History of Present Illness: Sandra Stark is a 53 y.o. female who is being seen today for the evaluation of atrial flutter/fibrillation at the request of Raina Mina., MD. Presenting today for electrophysiology evaluation.  She has a history of atrial flutter, atrial fibrillation, atrial tachycardia, and sleep apnea.  Most recent echo in 2014 showed normal LV function.  She was put on flecainide.  She had a stress test that showed anterolateral ischemia followed by left heart cath that showed normal coronary arteries.  Today, denies symptoms of palpitations, chest pain, shortness of breath, orthopnea, PND, lower extremity edema, claudication, dizziness,  bleeding, or neurologic sequela. The patient is tolerating medications without difficulties.  In October, she had an episode of syncope.  She was standing for quite a while.  She got hot and sweaty prior to that episode.  Since then, she has had 2 episodes of near syncope.  She has been able to sit down in the chair which is greatly improved her symptoms.  Otherwise she has done well.  She has noted no further episodes of atrial fibrillation.   Past Medical History:  Diagnosis Date  . Atrial tachycardia ([place])    a. Dx 03/2013.  . Chest pain    a. 03/2013: cath negative for CAD, no PE by CT angio.  Marland Kitchen Hyperglycemia    a. A1c 5.8 in 03/2013.  Marland Kitchen Hyperlipidemia    a. Monitored by PCP - elected to pursue lifestyle modification in 09/2012 with reassessment pending.  Marland Kitchen PAC (premature atrial contraction)    a. Dx 03/2013.  Marland Kitchen PAF (paroxysmal atrial fibrillation) ([place])    a. Dx 03/2013 - started on flecainide. CHADSVASC = 1 so no anticoag at present time.  . Paroxysmal atrial flutter ([place])    a. Dx  03/2013.  . Pulmonary nodule, right    a. 28mm RML by CT angio 03/2013.   Past Surgical History:  Procedure Laterality Date  . APPENDECTOMY    . CHOLECYSTECTOMY    . HERNIA REPAIR    . LEFT HEART CATHETERIZATION WITH CORONARY ANGIOGRAM N/A 03/28/2013   Procedure: LEFT HEART CATHETERIZATION WITH CORONARY ANGIOGRAM;  Surgeon: Ramond Dial, MD;  Location: [facility] CATH LAB;  Service: Cardiovascular;  Laterality: N/A;     Current Outpatient Medications  Medication Sig Dispense Refill  . flecainide (TAMBOCOR) 50 MG tablet Take 1 tablet (50 mg total) by mouth 2 (two) times daily. Please make overdue appt with Dr. Curt Bears before anymore refills. 1st attempt 180 tablet 0  . metoprolol succinate (TOPROL-XL) 25 MG 24 hr tablet TAKE 1 TABLET BY MOUTH DAILY. PLEASE CALL TO SCHEDULE APPT FOR FURTHER REFILLS 3RD ATTEMPT 90 tablet 0   No current facility-administered medications for this visit.     Allergies:   Patient has no known allergies.   Social History:  The patient  reports that she has never smoked. She has never used smokeless tobacco. She reports current alcohol use. She reports that she does not use drugs.   Family History:  The patient's family history includes Breast cancer in her sister; Colon cancer in her mother; Colon polyps in her sister; Diabetes in her paternal grandfather and paternal grandmother; Heart disease in her  unknown relative; Rectal cancer in her mother; Stroke in her paternal grandfather and paternal grandmother.    ROS:  Please see the history of present illness.   Otherwise, review of systems is positive for none.   All other systems are reviewed and negative.   PHYSICAL EXAM: VS:  BP 136/84   Pulse 83   Ht 5' 4.5" (1.638 m)   Wt 288 lb 3.2 oz (130.7 kg)   SpO2 99%   BMI 48.71 kg/m  , BMI Body mass index is 48.71 kg/m. GEN: Well nourished, well developed, in no acute distress  HEENT: normal  Neck: no JVD, carotid bruits, or masses Cardiac: RRR; no  murmurs, rubs, or gallops,no edema  Respiratory:  clear to auscultation bilaterally, normal work of breathing GI: soft, nontender, nondistended, + BS MS: no deformity or atrophy  Skin: warm and dry Neuro:  Strength and sensation are intact Psych: euthymic mood, full affect  EKG:  EKG is ordered today. Personal review of the ekg ordered shows sinus rhythm, rate 83  Recent Labs: No results found for requested labs within last 8760 hours.    Lipid Panel     Component Value Date/Time   CHOL 152 12/05/2014 1216   TRIG 203.0 (H) 12/05/2014 1216   HDL 32.30 (L) 12/05/2014 1216   CHOLHDL 5 12/05/2014 1216   VLDL 40.6 (H) 12/05/2014 1216   LDLCALC 72 10/07/2014 0838   LDLDIRECT 87.0 12/05/2014 1216     Wt Readings from Last 3 Encounters:  03/19/19 288 lb 3.2 oz (130.7 kg)  06/16/17 270 lb (122.5 kg)  02/29/16 272 lb 12.8 oz (123.7 kg)      Other studies Reviewed: Additional studies/ records that were reviewed today include: TTE 2014  Review of the above records today demonstrates:  - Left ventricle: Technically limited study. The cavity size was normal. Wall thickness was increased in a pattern of mild LVH. The estimated ejection fraction was 60%. Wall motion was normal; there were no regional wall motion abnormalities. - Pulmonary arteries: PA peak pressure: 72mm Hg (S).   ASSESSMENT AND PLAN:  1.  Paroxysmal atrial fibrillation/atrial flutter/atrial tachycardia: Currently on metoprolol and flecainide.  Not on anticoagulation due to low stroke risk.  No further episodes of atrial fibrillation noted.  This patients CHA2DS2-VASc Score and unadjusted Ischemic Stroke Rate (% per year) is equal to 0.6 % stroke rate/year from a score of 1  Above score calculated as 1 point each if present [CHF, HTN, DM, Vascular=MI/PAD/Aortic Plaque, Age if 65-74, or Female] Above score calculated as 2 points each if present [Age > 75, or Stroke/TIA/TE]   2.  Hyperlipidemia:  Currently diet controlled.  No changes.  3.  Snoring: Potentially due to sleep apnea.  I have discussed sleep study with her.  She would like to think about this and  get back to [place] with a decision.  4.  Morbid obesity: Weight loss encouraged  5.  Near syncope: She did have one episode of syncope, but this appears to be vagal or orthostatic.  She has had 2 other episodes.  Due to that, we  order an echocardiogram and fit her with a 30-day monitor.  Current medicines are reviewed at length with the patient today.   The patient does not have concerns regarding her medicines.  The following changes were made today: None  Labs/ tests ordered today include:  Orders Placed This Encounter  Procedures  . Cardiac event monitor  . EKG 12-Lead  . ECHOCARDIOGRAM  COMPLETE     Disposition:   FU with   12 months  Signed,  Meredith Leeds, MD  03/19/2019 11:31 AM     [facility] [address] [place] [address] [phone] (office) [phone] (fax)

## 2019-03-19 NOTE — Patient Instructions (Addendum)
Medication Instructions:  Your physician recommends that you continue on your current medications as directed. Please refer to the Current Medication list given to you today.  * If you need a refill on your cardiac medications before your next appointment, please call your pharmacy.   Labwork: None ordered  Testing/Procedures: Your physician has requested that you have an echocardiogram. Echocardiography is a painless test that uses sound waves to create images of your heart. It provides your doctor with information about the size and shape of your heart and how well your heart's chambers and valves are working. This procedure takes approximately one hour. There are no restrictions for this procedure.  Your physician has recommended that you wear an event monitor. Event monitors are medical devices that record the heart's electrical activity. Doctors most often [place] these monitors to diagnose arrhythmias. Arrhythmias are problems with the speed or rhythm of the heartbeat. The monitor is a small, portable device. You can wear one while you do your normal daily activities. This is usually used to diagnose what is causing palpitations/syncope (passing out).  Follow-Up: To be determined after echocardiogram and event monitor  Thank you for choosing [facility]!!   Trinidad Curet, RN [phone]  Any Other Special Instructions Will Be Listed Below (If Applicable).   Ambulatory Cardiac Monitoring An ambulatory cardiac monitor is a small recording device that is used to detect abnormal heart rhythms (arrhythmias). Most monitors are connected by wires to flat, sticky disks (electrodes) that are then attached to your chest. You may need to wear a monitor if you have had symptoms such as:  Fast heartbeats (palpitations).  Dizziness.  Fainting or light-headedness.  Unexplained weakness.  Shortness of breath. There are several types of monitors. Some common monitors include:  Holter  monitor. This records your heart rhythm continuously, usually for 24-48 hours.  Event (episodic) monitor. This monitor has a symptoms button, and when pushed, it will begin recording. You need to activate this monitor to record when you have a heart-related symptom.  Automatic detection monitor. This monitor will begin recording when it detects an abnormal heartbeat. What are the risks? Generally, these devices are safe to use. However, it is possible that the skin under the electrodes will become irritated. How to prepare for monitoring Your health care provider will prepare your chest for the electrode placement and show you how to use the monitor.  Do not apply lotions to your chest before monitoring.  Follow directions on how to care for the monitor, and how to return the monitor when the testing period is complete. How to use your cardiac monitor  Follow directions about how long to wear the monitor, and if you can take the monitor off in order to shower or bathe. ? Do not let the monitor get wet. ? Do not bathe, swim, or use a hot tub while wearing the monitor.  Keep your skin clean. Do not put body lotion or moisturizer on your chest.  Change the electrodes as told by your health care provider, or any time they stop sticking to your skin. You may need to use medical tape to keep them on.  Try to put the electrodes in slightly different places on your chest to help prevent skin irritation. Follow directions from your health care provider about where to place the electrodes.  Make sure the monitor is safely clipped to your clothing or in a location close to your body as recommended by your health care provider.  If  your monitor has a symptoms button, press the button to mark an event as soon as you feel a heart-related symptom, such as: ? Dizziness. ? Weakness. ? Light-headedness. ? Palpitations. ? Thumping or pounding in your chest. ? Shortness of breath. ? Unexplained  weakness.  Keep a diary of your activities, such as walking, doing chores, and taking medicine. It is very important to note what you were doing when you pushed the button to record your symptoms. This will help your health care provider determine what might be contributing to your symptoms.  Send the recorded information as recommended by your health care provider. It may take some time for your health care provider to process the results.  Change the batteries as told by your health care provider.  Keep electronic devices away from your monitor. These include: ? Tablets. ? MP3 players. ? Cell phones.  While wearing your monitor you should avoid: ? Electric blankets. ? Armed forces operational officer. ? Electric toothbrushes. ? Microwave ovens. ? Magnets. ? Metal detectors. Get help right away if:  You have chest pain.  You have shortness of breath or extreme difficulty breathing.  You develop a very fast heartbeat that does not get better.  You develop dizziness that does not go away.  You faint or constantly feel like you are about to faint. Summary  An ambulatory cardiac monitor is a small recording device that is used to detect abnormal heart rhythms (arrhythmias).  Make sure you understand how to send the information from the monitor to your health care provider.  It is important to press the button on the monitor when you have any heart-related symptoms.  Keep a diary of your activities, such as walking, doing chores, and taking medicine. It is very important to note what you were doing when you pushed the button to record your symptoms. This will help your health care provider learn what might be causing your symptoms. This information is not intended to replace advice given to you by your health care provider. Make sure you discuss any questions you have with your health care provider. Document Released: 01/12/2008 Document Revised: 03/17/2017 Document Reviewed: 03/19/2016 Elsevier  Patient Education  2020 [employer].

## 2019-03-21 ENCOUNTER — Telehealth: Payer: Self-pay | Admitting: Radiology

## 2019-03-21 NOTE — Telephone Encounter (Signed)
Enrolled patient for a 30 day [employer] Event monitor to be mailed. Brief instructions were gone over with patient and she knows to expect the monitor to arrive in 3-4 days. Patient is currently selfpay and has been instructed to speak to [employer] about payment options

## 2019-03-29 ENCOUNTER — Other Ambulatory Visit: Payer: Self-pay

## 2019-03-29 ENCOUNTER — Ambulatory Visit (HOSPITAL_COMMUNITY): Payer: Medicaid Other | Attending: Cardiology

## 2019-03-29 DIAGNOSIS — R55 Syncope and collapse: Secondary | ICD-10-CM | POA: Diagnosis present

## 2019-03-29 DIAGNOSIS — I480 Paroxysmal atrial fibrillation: Secondary | ICD-10-CM | POA: Insufficient documentation

## 2019-04-01 ENCOUNTER — Telehealth: Payer: Self-pay | Admitting: Cardiology

## 2019-04-01 ENCOUNTER — Other Ambulatory Visit: Payer: Self-pay

## 2019-04-01 DIAGNOSIS — I480 Paroxysmal atrial fibrillation: Secondary | ICD-10-CM

## 2019-04-01 MED ORDER — METOPROLOL SUCCINATE ER 25 MG PO TB24: ORAL_TABLET | ORAL | 3 refills | Status: DC

## 2019-04-01 MED ORDER — FLECAINIDE ACETATE 50 MG PO TABS: 50.0000 mg | ORAL_TABLET | Freq: Two times a day (BID) | ORAL | 3 refills | Status: DC

## 2019-04-01 NOTE — Telephone Encounter (Signed)
Patient calling for echo results 

## 2019-04-02 ENCOUNTER — Other Ambulatory Visit: Payer: Self-pay

## 2019-04-02 DIAGNOSIS — I480 Paroxysmal atrial fibrillation: Secondary | ICD-10-CM

## 2019-04-02 MED ORDER — FLECAINIDE ACETATE 50 MG PO TABS: 50.0000 mg | ORAL_TABLET | Freq: Two times a day (BID) | ORAL | 3 refills | Status: DC

## 2019-04-02 MED ORDER — METOPROLOL SUCCINATE ER 25 MG PO TB24: ORAL_TABLET | ORAL | 3 refills | Status: DC

## 2019-04-02 NOTE — Telephone Encounter (Signed)
Informed patient of results and verbal understanding expressed.  

## 2019-04-03 ENCOUNTER — Other Ambulatory Visit: Payer: Self-pay

## 2019-04-03 DIAGNOSIS — I480 Paroxysmal atrial fibrillation: Secondary | ICD-10-CM

## 2019-04-03 MED ORDER — METOPROLOL SUCCINATE ER 25 MG PO TB24: ORAL_TABLET | ORAL | 3 refills | Status: DC

## 2019-04-03 MED ORDER — FLECAINIDE ACETATE 50 MG PO TABS: 50.0000 mg | ORAL_TABLET | Freq: Two times a day (BID) | ORAL | 3 refills | Status: DC

## 2019-07-09 ENCOUNTER — Encounter: Payer: Self-pay | Admitting: Radiology

## 2019-07-09 NOTE — Progress Notes (Signed)
Late  Entry.... Received a fax from [place] cardiac monitor company on 03/31/20. The [employer] monitoring Center has not been able to baseline/hook up patient. [employer] has attempted to contact the patient with no success. All number provided were tried and text messages were sent. The company recived the monitor back unused on 06/10/19.

## 2019-08-15 ENCOUNTER — Emergency Department (HOSPITAL_COMMUNITY): Payer: Medicaid Other

## 2019-08-15 ENCOUNTER — Encounter (HOSPITAL_COMMUNITY): Payer: Self-pay

## 2019-08-15 ENCOUNTER — Other Ambulatory Visit: Payer: Self-pay

## 2019-08-15 ENCOUNTER — Emergency Department (HOSPITAL_COMMUNITY)
Admission: EM | Admit: 2019-08-15 | Discharge: 2019-08-16 | Disposition: A | Discharge status: Home / Self Care | Payer: Medicaid Other | Attending: Emergency Medicine | Admitting: Emergency Medicine

## 2019-08-15 DIAGNOSIS — R05 Cough: Secondary | ICD-10-CM | POA: Diagnosis not present

## 2019-08-15 DIAGNOSIS — I4892 Unspecified atrial flutter: Secondary | ICD-10-CM | POA: Insufficient documentation

## 2019-08-15 DIAGNOSIS — R5383 Other fatigue: Secondary | ICD-10-CM | POA: Diagnosis not present

## 2019-08-15 DIAGNOSIS — R0981 Nasal congestion: Secondary | ICD-10-CM | POA: Diagnosis not present

## 2019-08-15 DIAGNOSIS — I4891 Unspecified atrial fibrillation: Secondary | ICD-10-CM | POA: Diagnosis present

## 2019-08-15 DIAGNOSIS — Z20822 Contact with and (suspected) exposure to covid-19: Secondary | ICD-10-CM | POA: Insufficient documentation

## 2019-08-15 DIAGNOSIS — Z79899 Other long term (current) drug therapy: Secondary | ICD-10-CM | POA: Insufficient documentation

## 2019-08-15 LAB — CBC WITH DIFFERENTIAL/PLATELET
Abs Immature Granulocytes: 0.05 10*3/uL (ref 0.00–0.07)
Basophils Absolute: 0 10*3/uL (ref 0.0–0.1)
Basophils Relative: 0 %
Eosinophils Absolute: 0.1 10*3/uL (ref 0.0–0.5)
Eosinophils Relative: 1 %
HCT: 45.6 % (ref 36.0–46.0)
Hemoglobin: 14.7 g/dL (ref 12.0–15.0)
Immature Granulocytes: 1 %
Lymphocytes Relative: 27 %
Lymphs Abs: 2.4 10*3/uL (ref 0.7–4.0)
MCH: 31.5 pg (ref 26.0–34.0)
MCHC: 32.2 g/dL (ref 30.0–36.0)
MCV: 97.6 fL (ref 80.0–100.0)
Monocytes Absolute: 0.9 10*3/uL (ref 0.1–1.0)
Monocytes Relative: 10 %
Neutro Abs: 5.5 10*3/uL (ref 1.7–7.7)
Neutrophils Relative %: 61 %
Platelets: 236 10*3/uL (ref 150–400)
RBC: 4.67 MIL/uL (ref 3.87–5.11)
RDW: 13.3 % (ref 11.5–15.5)
WBC: 8.9 10*3/uL (ref 4.0–10.5)
nRBC: 0 % (ref 0.0–0.2)

## 2019-08-15 LAB — MAGNESIUM: Magnesium: 2.1 mg/dL (ref 1.7–2.4)

## 2019-08-15 LAB — TROPONIN I (HIGH SENSITIVITY)
Troponin I (High Sensitivity): 3 ng/L (ref <18)
Troponin I (High Sensitivity): 3 ng/L (ref <18)

## 2019-08-15 LAB — BASIC METABOLIC PANEL
Anion gap: 13 (ref 5–15)
BUN: 12 mg/dL (ref 6–20)
CO2: 23 mmol/L (ref 22–32)
Calcium: 9.4 mg/dL (ref 8.9–10.3)
Chloride: 105 mmol/L (ref 98–111)
Creatinine, Ser: 1.03 mg/dL — ABNORMAL HIGH (ref 0.44–1.00)
GFR calc Af Amer: >60 mL/min (ref >60)
GFR calc non Af Amer: >60 mL/min (ref >60)
Glucose, Bld: 110 mg/dL — ABNORMAL HIGH (ref 70–99)
Potassium: 4.4 mmol/L (ref 3.5–5.1)
Sodium: 141 mmol/L (ref 135–145)

## 2019-08-15 MED ORDER — SODIUM CHLORIDE 0.9 % IV BOLUS
1000.0000 mL | Freq: Once | INTRAVENOUS | Status: AC
  Administered 2019-08-15: 1000 mL via INTRAVENOUS

## 2019-08-15 MED ORDER — METOPROLOL TARTRATE 25 MG PO TABS
50.0000 mg | ORAL_TABLET | Freq: Once | ORAL | Status: AC
  Administered 2019-08-15: 23:00:00 50 mg via ORAL
  Filled 2019-08-15: qty 2

## 2019-08-15 MED ORDER — FLECAINIDE ACETATE 50 MG PO TABS
50.0000 mg | ORAL_TABLET | Freq: Once | ORAL | Status: AC
  Administered 2019-08-16: 50 mg via ORAL
  Filled 2019-08-15 (×2): qty 1

## 2019-08-15 MED ORDER — METOPROLOL TARTRATE 5 MG/5ML IV SOLN
5.0000 mg | INTRAVENOUS | Status: AC | PRN
  Administered 2019-08-15 (×3): 5 mg via INTRAVENOUS
  Filled 2019-08-15 (×3): qty 5

## 2019-08-15 NOTE — ED Notes (Signed)
BP noted to be 88/62, repeat was 84/69, MD made aware.

## 2019-08-15 NOTE — ED Triage Notes (Signed)
Pt BIB [facility] from [facility] w/ c/o Afib w/ RVR, w/ Hx of the same, pt sees cardiologist and takes meds for rate control. Pt has been seen for the past couple weeks for congestion, fever, and cough. Treated w/ prednisone and amoxicillin, finished both on Tuesday. HR per EMS 80's-170's. NAD noted, Pt A&O x4.

## 2019-08-15 NOTE — ED Provider Notes (Signed)
[place] EMERGENCY DEPARTMENT Provider Note   CSN: [id number] Arrival date & time: 08/15/19  1922     History Chief Complaint  Patient presents with  . Atrial Fibrillation    Sandra Stark is a 54 y.o. female.  Past medical history atrial fibrillation, atrial flutter managed on flecainide and metoprolol.  Presents to ER with concern for A. fib.  Patient reports for the past couple weeks she has had congestion, chills, mild cough.  States she is treated with prednisone and amoxicillin, recently finished these prescriptions.  Over the last week or so she has been feeling increased generalized fatigue, today felt worse.  EMS reported heart rate up to 170s, concern for A. fib with RVR.  Patient states she has no chest pain, no palpitations.  Reports that she did not have palpitations with her past episodes of A. fib.  Not currently on anticoagulation.  HPI     Past Medical History:  Diagnosis Date  . Atrial tachycardia ([place])    a. Dx 03/2013.  . Chest pain    a. 03/2013: cath negative for CAD, no PE by CT angio.  Marland Kitchen Hyperglycemia    a. A1c 5.8 in 03/2013.  Marland Kitchen Hyperlipidemia    a. Monitored by PCP - elected to pursue lifestyle modification in 09/2012 with reassessment pending.  Marland Kitchen PAC (premature atrial contraction)    a. Dx 03/2013.  Marland Kitchen PAF (paroxysmal atrial fibrillation) ([place])    a. Dx 03/2013 - started on flecainide. CHADSVASC = 1 so no anticoag at present time.  . Paroxysmal atrial flutter ([place])    a. Dx 03/2013.  . Pulmonary nodule, right    a. 20mm RML by CT angio 03/2013.    Patient Active Problem List   Diagnosis Date Noted  . OSA (obstructive sleep apnea) 06/25/2013  . Pulmonary nodule 04/08/2013  . PAF (paroxysmal atrial fibrillation) ([place]) 03/29/2013  . Atrial tachycardia ([place]) 03/29/2013  . Sinus bradycardia 03/29/2013  . Pulmonary nodule, right   . Hyperglycemia   . Chest pain 03/26/2013  . Hyperlipidemia 03/26/2013  . Obesity 03/26/2013  .  Paroxysmal atrial flutter ([place]) 03/26/2013  . PAC (premature atrial contraction) 03/26/2013    Past Surgical History:  Procedure Laterality Date  . APPENDECTOMY    . CHOLECYSTECTOMY    . HERNIA REPAIR    . LEFT HEART CATHETERIZATION WITH CORONARY ANGIOGRAM N/A 03/28/2013   Procedure: LEFT HEART CATHETERIZATION WITH CORONARY ANGIOGRAM;  Surgeon: Ramond Dial, MD;  Location: [facility] CATH LAB;  Service: Cardiovascular;  Laterality: N/A;     OB History   No obstetric history on file.     Family History  Problem Relation Age of Onset  . Colon cancer Mother   . Rectal cancer Mother   . Heart disease Other        several family members on dad's side have had heart issues - earliest in their 30's  . Stroke Paternal Grandfather   . Diabetes Paternal Grandfather   . Breast cancer Sister   . Colon polyps Sister        x 2  . Stroke Paternal Grandmother   . Diabetes Paternal Grandmother     Social History   Tobacco Use  . Smoking status: Never Smoker  . Smokeless tobacco: Never Used  Substance Use Topics  . Alcohol use: Yes    Comment: rare - 2x/month  . Drug use: No    Home Medications Prior to Admission medications   Medication  Sig Start Date End Date Taking? Authorizing Provider  ascorbic acid (VITAMIN C) 500 MG tablet Take 1,000 mg by mouth daily.   Yes [provider]  flecainide (TAMBOCOR) 50 MG tablet Take 1 tablet (50 mg total) by mouth 2 (two) times daily. 04/03/19  Yes Camnitz, Will Hassell Done, MD  loratadine (CLARITIN) 10 MG tablet Take 10 mg by mouth daily.   Yes [provider]  metoprolol succinate (TOPROL-XL) 25 MG 24 hr tablet TAKE 1 TABLET BY MOUTH DAILY. Patient taking differently: Take 25 mg by mouth daily.  04/03/19  Yes Camnitz, Will Hassell Done, MD  pantoprazole (PROTONIX) 40 MG tablet Take 40 mg by mouth daily.   Yes [provider]    Allergies    Patient has no known allergies.  Review of Systems   Review of Systems    Constitutional: Positive for chills and fatigue. Negative for fever.  HENT: Negative for ear pain and sore throat.   Eyes: Negative for pain and visual disturbance.  Respiratory: Positive for cough. Negative for shortness of breath.   Cardiovascular: Positive for palpitations. Negative for chest pain.  Gastrointestinal: Negative for abdominal pain and vomiting.  Genitourinary: Negative for dysuria and hematuria.  Musculoskeletal: Negative for arthralgias and back pain.  Skin: Negative for color change and rash.  Neurological: Negative for seizures and syncope.  All other systems reviewed and are negative.   Physical Exam Updated Vital Signs BP (!) 88/79   Pulse 62   Temp 98.8 F (37.1 C) (Oral)   Resp 16   Ht 5\' 5"  (1.651 m)   Wt 124.7 kg   SpO2 93%   BMI 45.76 kg/m   Physical Exam Vitals and nursing note reviewed.  Constitutional:      General: She is not in acute distress.    Appearance: She is well-developed.  HENT:     Head: Normocephalic and atraumatic.  Eyes:     Conjunctiva/sclera: Conjunctivae normal.  Cardiovascular:     Rate and Rhythm: Tachycardia present. Rhythm irregular.     Pulses: Normal pulses.     Heart sounds: Normal heart sounds. No murmur.  Pulmonary:     Effort: Pulmonary effort is normal. No respiratory distress.     Breath sounds: Normal breath sounds.  Abdominal:     Palpations: Abdomen is soft.     Tenderness: There is no abdominal tenderness.  Musculoskeletal:        General: No deformity or signs of injury.     Cervical back: Neck supple.  Skin:    General: Skin is warm and dry.     Capillary Refill: Capillary refill takes less than 2 seconds.  Neurological:     General: No focal deficit present.     Mental Status: She is alert and oriented to person, place, and time.  Psychiatric:        Mood and Affect: Mood normal.     ED Results / Procedures / Treatments   Labs (all labs ordered are listed, but only abnormal results are  displayed) Labs Reviewed  BASIC METABOLIC PANEL - Abnormal; Notable for the following components:      Result Value   Glucose, Bld 110 (*)    Creatinine, Ser 1.03 (*)    All other components within normal limits  CBC WITH DIFFERENTIAL/PLATELET  MAGNESIUM  BRAIN NATRIURETIC PEPTIDE  TROPONIN I (HIGH SENSITIVITY)  TROPONIN I (HIGH SENSITIVITY)    EKG EKG Interpretation  Date/Time:  Thursday August 15 2019 21:57:35 EDT Ventricular  Rate:  130 PR Interval:    QRS Duration: 96 QT Interval:  391 QTC Calculation: 604 R Axis:   60 Text Interpretation: Atrial flutter Lateral leads are also involved Prolonged QT interval no acute STEMI Reconfirmed by Madalyn Rob [phone]) on 08/15/2019 10:12:46 PM   Radiology DG Chest Portable 1 View  Result Date: 08/15/2019 CLINICAL DATA:  Atrial fibrillation EXAM: PORTABLE CHEST 1 VIEW COMPARISON:  03/26/2013 FINDINGS: The heart size and mediastinal contours are within normal limits. Both lungs are clear. The visualized skeletal structures are unremarkable. IMPRESSION: No active disease. Electronically Signed   By: Donavan Foil M.D.   On: 08/15/2019 20:40    Procedures .Critical Care Performed by: Lucrezia Starch, MD Authorized by: Lucrezia Starch, MD   Critical care provider statement:    Critical care time (minutes):  45   Critical care was necessary to treat or prevent imminent or life-threatening deterioration of the following conditions:  Cardiac failure   Critical care was time spent personally by me on the following activities:  Discussions with consultants, evaluation of patient's response to treatment, examination of patient, ordering and performing treatments and interventions, ordering and review of laboratory studies, ordering and review of radiographic studies, pulse oximetry, re-evaluation of patient's condition, obtaining history from patient or surrogate and review of old charts   (including critical care time)  Medications  Ordered in ED Medications  flecainide (TAMBOCOR) tablet 50 mg (has no administration in time range)  metoprolol tartrate (LOPRESSOR) injection 5 mg (5 mg Intravenous Given 08/15/19 2133)  metoprolol tartrate (LOPRESSOR) tablet 50 mg (50 mg Oral Given 08/15/19 2249)  sodium chloride 0.9 % bolus 1,000 mL (1,000 mLs Intravenous New Bag/Given 08/15/19 2300)    ED Course  I have reviewed the triage vital signs and the nursing notes.  Pertinent labs & imaging results that were available during my care of the patient were reviewed by me and considered in my medical decision making (see chart for details).    MDM Rules/Calculators/A&P                      54 year old lady past history of atrial flutter/A. fib presenting to ER after patient was found to have recurrence of atrial tachycardia.  On review of her EKG, patient is most likely in atrial flutter.  Patient remarkably well-appearing, no associated chest pain.  Troponin not elevated, doubt ACS.  Discussed case with cardiology on-call, Falk-Martin who recommended trying flecainide and p.o. metoprolol for rate control.  Patient did not have significant change in rate and still remained fairly tachycardic.  At time of signout to Dr. Dayna Barker, awaiting callback from cardiology.  Anticipate patient needs admission for further rate control, consultation with EP for ablation vs cardioversion.   Final Clinical Impression(s) / ED Diagnoses Final diagnoses:  Atrial flutter, unspecified type [facility])    Rx / DC Orders ED Discharge Orders    None       Lucrezia Starch, MD 08/16/19 [phone]

## 2019-08-16 ENCOUNTER — Encounter (HOSPITAL_COMMUNITY): Payer: Self-pay | Admitting: Physician Assistant

## 2019-08-16 ENCOUNTER — Ambulatory Visit (HOSPITAL_BASED_OUTPATIENT_CLINIC_OR_DEPARTMENT_OTHER)
Admission: RE | Admit: 2019-08-16 | Discharge: 2019-08-16 | Disposition: A | Discharge status: Home / Self Care | Payer: Medicaid Other | Source: Ambulatory Visit | Attending: Physician Assistant | Admitting: Physician Assistant

## 2019-08-16 ENCOUNTER — Telehealth: Payer: Self-pay | Admitting: Physician Assistant

## 2019-08-16 ENCOUNTER — Other Ambulatory Visit (HOSPITAL_COMMUNITY)
Admission: RE | Admit: 2019-08-16 | Discharge: 2019-08-16 | Disposition: A | Discharge status: Home / Self Care | Payer: Medicaid Other | Source: Ambulatory Visit | Attending: Cardiology | Admitting: Cardiology

## 2019-08-16 VITALS — BP 110/88 | HR 130 | Ht 65.0 in | Wt 290.6 lb

## 2019-08-16 DIAGNOSIS — R05 Cough: Secondary | ICD-10-CM | POA: Diagnosis not present

## 2019-08-16 DIAGNOSIS — R5383 Other fatigue: Secondary | ICD-10-CM | POA: Diagnosis not present

## 2019-08-16 DIAGNOSIS — I483 Typical atrial flutter: Secondary | ICD-10-CM

## 2019-08-16 DIAGNOSIS — R0981 Nasal congestion: Secondary | ICD-10-CM | POA: Diagnosis not present

## 2019-08-16 DIAGNOSIS — I4892 Unspecified atrial flutter: Secondary | ICD-10-CM | POA: Diagnosis not present

## 2019-08-16 HISTORY — DX: Typical atrial flutter: I48.3

## 2019-08-16 LAB — SARS CORONAVIRUS 2 (TAT 6-24 HRS): SARS Coronavirus 2: NEGATIVE

## 2019-08-16 LAB — BRAIN NATRIURETIC PEPTIDE: B Natriuretic Peptide: 290.9 pg/mL — ABNORMAL HIGH (ref 0.0–100.0)

## 2019-08-16 MED ORDER — APIXABAN 5 MG PO TABS
5.0000 mg | ORAL_TABLET | Freq: Two times a day (BID) | ORAL | 3 refills | Status: DC
Start: 2019-08-16 — End: 2019-12-11

## 2019-08-16 MED ORDER — METOPROLOL TARTRATE 50 MG PO TABS
50.0000 mg | ORAL_TABLET | Freq: Every day | ORAL | 0 refills | Status: DC | PRN
Start: 2019-08-16 — End: 2020-07-06

## 2019-08-16 NOTE — Telephone Encounter (Addendum)
   Cardiology fellow Rudean Curt signed out to me that she got call about this patient yesterday who returned to the ED with atrial flutter. She said advised trial of flecainide and metoprolol, and to start anticoagulation. She awaited update on patient but when she returned to chart patient had ben discharged from ED. She requested our office arrange early follow-up. EDP note does not outline plan for discharge or if she converted. Last EDP note states "At time of signout to Dr. Macenzie Burford Barker, awaiting callback from cardiology.  Anticipate patient needs admission for further rate control, consultation with EP for ablation vs cardioversion." There is then a nurse note outlining that dc paperwork was provided to patient.  Addendum: I was able to get in touch with Dr. Bralin Garry Barker (EDP) who indicates that when he checked on patient later her HR was better controlled in 80s-90s so he discharged her.  Will route to Dr. Curt Bears for input on anticoagulation, and to [facility] triage/afib clinic nurse to see if we can get the patient in for post-ER follow-up today. Jacere Pangborn PA-C

## 2019-08-16 NOTE — Telephone Encounter (Signed)
Patient needs AF clinic appointment for further rhythm control options.

## 2019-08-16 NOTE — Progress Notes (Signed)
Primary Care Physician: Raina Mina., MD Primary Cardiologist: Dr Aundra Dubin Primary Electrophysiologist: Dr Curt Bears Referring Physician: Dr Curt Bears   Sandra Stark is a 54 y.o. female with a history of paroxysmal atrial fibrillation, atrial flutter, atrial tachycardia, OSA, HLD who presents for follow up in the [facility].  The patient was initially diagnosed with atrial arrhythmias in 2014. She has been maintained on flecainide and metoprolol. Patient is not on anticoagulation for a CHADS2VASC score of 1. She was in her usual state of health until 08/15/19 when she presented to the ED with generalized fatigue for the last two weeks and was found to be in rapid atrial flutter. She was rate controlled and discharged. Patient states she was being treated for an URI with prednisone and abx prior to the onset of symptoms.  On follow up today, she continues to have generalized fatigue. She has not yet picked up her increased dose of BB. She is in rapid atrial flutter today. She admits to severe snoring, daytime somnolence, and witnessed apnea.   Today, she denies symptoms of palpitations, chest pain, shortness of breath, orthopnea, PND, lower extremity edema, dizziness, presyncope, syncope, bleeding, or neurologic sequela. The patient is tolerating medications without difficulties and is otherwise without complaint today.    Atrial Fibrillation Risk Factors:  she does have symptoms or diagnosis of sleep apnea. she is not compliant with CPAP therapy. she does not have a history of rheumatic fever. she does not have a history of alcohol use. The patient does not have a history of early familial atrial fibrillation or other arrhythmias.  she has a BMI of Body mass index is 48.36 kg/m.Marland Kitchen Filed Weights   08/16/19 1331  Weight: 131.8 kg    Family History  Problem Relation Age of Onset   Colon cancer Mother    Rectal cancer Mother    Heart disease Other     several family members on dad's side have had heart issues - earliest in their 47's   Stroke Paternal Grandfather    Diabetes Paternal Grandfather    Breast cancer Sister    Colon polyps Sister        x 2   Stroke Paternal Grandmother    Diabetes Paternal Grandmother      Atrial Fibrillation Management history:  Previous antiarrhythmic drugs: flecainide  Previous cardioversions: none Previous ablations: none CHADS2VASC score: 1 Anticoagulation history: none   Past Medical History:  Diagnosis Date   Atrial tachycardia ([place])    a. Dx 03/2013.   Chest pain    a. 03/2013: cath negative for CAD, no PE by CT angio.   Hyperglycemia    a. A1c 5.8 in 03/2013.   Hyperlipidemia    a. Monitored by PCP - elected to pursue lifestyle modification in 09/2012 with reassessment pending.   PAC (premature atrial contraction)    a. Dx 03/2013.   PAF (paroxysmal atrial fibrillation) ([place])    a. Dx 03/2013 - started on flecainide. CHADSVASC = 1 so no anticoag at present time.   Paroxysmal atrial flutter ([place])    a. Dx 03/2013.   Pulmonary nodule, right    a. 10mm RML by CT angio 03/2013.   Past Surgical History:  Procedure Laterality Date   APPENDECTOMY     CHOLECYSTECTOMY     HERNIA REPAIR     LEFT HEART CATHETERIZATION WITH CORONARY ANGIOGRAM N/A 03/28/2013   Procedure: LEFT HEART CATHETERIZATION WITH CORONARY ANGIOGRAM;  Surgeon: Carrie Mew  Nahser, MD;  Location: [facility];  Service: Cardiovascular;  Laterality: N/A;    Current Outpatient Medications  Medication Sig Dispense Refill   ascorbic acid (VITAMIN C) 500 MG tablet Take 1,000 mg by mouth daily.     flecainide (TAMBOCOR) 50 MG tablet Take 1 tablet (50 mg total) by mouth 2 (two) times daily. 180 tablet 3   loratadine (CLARITIN) 10 MG tablet Take 10 mg by mouth daily.     metoprolol succinate (TOPROL-XL) 25 MG 24 hr tablet TAKE 1 TABLET BY MOUTH DAILY. 90 tablet 3   metoprolol tartrate (LOPRESSOR) 50 MG  tablet Take 1 tablet (50 mg total) by mouth daily as needed for up to 1 dose (consistent heart greater than 120 (longer than an hour at rest)). 60 tablet 0   pantoprazole (PROTONIX) 40 MG tablet Take 40 mg by mouth daily.     apixaban (ELIQUIS) 5 MG TABS tablet Take 1 tablet (5 mg total) by mouth 2 (two) times daily. 60 tablet 3   No current facility-administered medications for this encounter.    No Known Allergies  Social History   Socioeconomic History   Marital status: Married    Spouse name: Not on file   Number of children: 2   Years of education: Not on file   Highest education level: Not on file  Occupational History    Employer: [employer]    Comment: Adminstrative assistance  Tobacco Use   Smoking status: Never Smoker   Smokeless tobacco: Never Used  Substance and Sexual Activity   Alcohol use: Yes    Comment: rare - 2x/month   Drug use: No   Sexual activity: Yes  Other Topics Concern   Not on file  Social History Narrative   Not on file   Social Determinants of Health   Financial Resource Strain:    Difficulty of Paying Living Expenses:   Food Insecurity:    Worried About Charity fundraiser in the Last Year:    Arboriculturist in the Last Year:   Transportation Needs:    Film/video editor (Medical):    Lack of Transportation (Non-Medical):   Physical Activity:    Days of Exercise per Week:    Minutes of Exercise per Session:   Stress:    Feeling of Stress :   Social Connections:    Frequency of Communication with Friends and Family:    Frequency of Social Gatherings with Friends and Family:    Attends Religious Services:    Active Member of Clubs or Organizations:    Attends Music therapist:    Marital Status:   Intimate Partner Violence:    Fear of Current or Ex-Partner:    Emotionally Abused:    Physically Abused:    Sexually Abused:      ROS- All systems are reviewed and negative  except as per the HPI above.  Physical Exam: Vitals:   08/16/19 1331  BP: 110/88  Pulse: (!) 130  Weight: 131.8 kg  Height: 5\' 5"  (1.651 m)    GEN- The patient is well appearing obese female, alert and oriented x 3 today.   Head- normocephalic, atraumatic Eyes-  Sclera clear, conjunctiva pink Ears- hearing intact Oropharynx- clear Neck- supple  Lungs- Clear to ausculation bilaterally, normal work of breathing Heart- irregular rate and rhythm, tachycardia, no murmurs, rubs or gallops  GI- soft, NT, ND, + BS Extremities- no clubbing, cyanosis, or edema MS- no significant deformity  or atrophy Skin- no rash or lesion Psych- euthymic mood, full affect Neuro- strength and sensation are intact  Wt Readings from Last 3 Encounters:  08/16/19 131.8 kg  08/15/19 124.7 kg  03/19/19 130.7 kg    EKG today demonstrates typical atrial flutter with variable conduction with salvos of atrial tach vs 2:1 atrial flutter conduction HR 130, QRS 86, QTc 456  Echo 03/29/19 demonstrated  1. Left ventricular ejection fraction, by visual estimation, is 60 to  65%. The left ventricle has normal function. There is no left ventricular  hypertrophy.  2. The left ventricle has no regional wall motion abnormalities.  3. Global right ventricle has normal systolic function.The right  ventricular size is normal. No increase in right ventricular wall  thickness.  4. Left atrial size was normal.  5. Right atrial size was normal.  6. The mitral valve is normal in structure. Trivial mitral valve  regurgitation. No evidence of mitral stenosis.  7. The tricuspid valve is normal in structure. Tricuspid valve  regurgitation is not demonstrated.  8. The aortic valve was not well visualized. Aortic valve regurgitation  is not visualized. Mild to moderate aortic valve sclerosis/calcification  without any evidence of aortic stenosis.  9. The pulmonic valve was normal in structure. Pulmonic valve    regurgitation is not visualized.  10. Normal pulmonary artery systolic pressure.  11. The inferior vena cava is normal in size with greater than 50%  respiratory variability, suggesting right atrial pressure of 3 mmHg.   Epic records are reviewed at length today  CHA2DS2-VASc Score = 1  The patient's score is based upon: CHF History: 0 HTN History: 0 Age : 0 Diabetes History: 0 Stroke History: 0 Vascular Disease History: 0 Gender: 1      ASSESSMENT AND PLAN: 1. Paroxysmal Atrial Fibrillation/atrial flutter/atrial tachycardia The patient's CHA2DS2-VASc score is 1, indicating a 0.6% annual risk of stroke.   General education about afib/flutter provided and questions answered. We also discussed her stroke and the risks and benefits of anticoagulation.  Will plan to start Eliquis 5 mg BID given her symptoms have been well beyond 48 hours.  We discussed TEE/DCCV and patient is agreeable. Will arrange after 5 doses of Eliquis.  Continue Toprol 25 mg daily. Patient to start Lopressor 50 mg q 6 hours for heart rate >100.  Continue flecainide 50 mg BID. Could consider increasing this in the future. Will continue present dose so she does not chemically convert without adequate anticoagulation. Lifestyle modification as below.  2. Obesity Body mass index is 48.36 kg/m. Lifestyle modification was discussed at length including regular exercise and weight reduction.  3. Obstructive sleep apnea Patient having symptoms of daytime somnolence, severe snoring, and witnessed apnea. The importance of adequate treatment of sleep apnea was discussed today in order to improve our ability to maintain sinus rhythm long term. Patient agreeable for sleep study. Will refer.   Follow up in the AF clinic one week post DCCV.   [facility] [address] [place], [place] [address] [phone] 08/16/2019 2:16 PM

## 2019-08-16 NOTE — Telephone Encounter (Signed)
   Primary Cardiologist: Will Meredith Leeds, MD  Chart reviewed as part of pre-operative protocol coverage.   Suspect this was forwarded to preop in error. No upcoming preop needs documented.    I will remove from pre-op pool at this time.   Abigail Butts, PA-C 08/16/2019, 8:56 AM

## 2019-08-16 NOTE — ED Notes (Signed)
Patient verbalizes understanding of discharge instructions. Opportunity for questioning and answers were provided. Armband removed by staff, pt discharged from ED ambulatory w/ daughter  

## 2019-08-16 NOTE — Patient Instructions (Signed)
Start Eliquis 5mg  twice a day  Cardioversion scheduled for Tuesday, May 4th  - Arrive at the [facility] and go to admitting at 12PM  -Do not eat or drink anything after midnight the night prior to your procedure.  - Take all your morning medication with a sip of water prior to arrival.  - You will not be able to drive home after your procedure.

## 2019-08-16 NOTE — Telephone Encounter (Signed)
Patient returned my call and is aware of appt today 4/30 @1 :30 pm with Adline Peals, PA>

## 2019-08-16 NOTE — Telephone Encounter (Signed)
Called and left message for patient to call [facility] to schedule appt. 

## 2019-08-19 ENCOUNTER — Telehealth (HOSPITAL_COMMUNITY): Payer: Self-pay | Admitting: Licensed Clinical Social Worker

## 2019-08-19 NOTE — Telephone Encounter (Signed)
CSW consulted by a-fib clinic to reach out to pt regarding financial assistance as she is having multiple tests and procedures and has no insurance.  Pt reports she has not had insurance in about 3 years now.  Has a full time job but no insurance offered through them.  Previously had been on [employer] but stopped 3 years ago when it became unaffordable.  Pt was planning on looking into [employer]CA again as she had recently gotten information in the mail about more affordable plans being available- will plan to do this ASAP.  CSW then discussed CAFA to assist with [facility]- CSW mailed pt an application to complete and turn back in for review.  CSW then inquired about Eliquis assistance as a-fib clinic had provided her a BMS application.  Pt did not realize what the application was for and had been concerned about affording Eliquis following 30 day trial.  CSW explained that pt could get Eliquis for free if she qualifies- she plans to complete application and return to a-fib clinic tomorrow before her procedure.  CSW will continue to follow and assist as needed- encouraged pt to reach out with any questions  Jorge Ny, [facility] Desk#: [phone] Cell#: [phone]

## 2019-08-20 ENCOUNTER — Ambulatory Visit (HOSPITAL_BASED_OUTPATIENT_CLINIC_OR_DEPARTMENT_OTHER)
Admission: RE | Admit: 2019-08-20 | Discharge: 2019-08-20 | Disposition: A | Discharge status: Home / Self Care | Payer: Medicaid Other | Source: Ambulatory Visit | Attending: Physician Assistant | Admitting: Physician Assistant

## 2019-08-20 ENCOUNTER — Ambulatory Visit (HOSPITAL_COMMUNITY): Payer: Medicaid Other | Admitting: Anesthesiology

## 2019-08-20 ENCOUNTER — Encounter (HOSPITAL_COMMUNITY): Payer: Self-pay | Admitting: Cardiology

## 2019-08-20 ENCOUNTER — Ambulatory Visit (HOSPITAL_COMMUNITY)
Admission: RE | Admit: 2019-08-20 | Discharge: 2019-08-20 | Disposition: A | Discharge status: Home / Self Care | Payer: Medicaid Other | Attending: Cardiology | Admitting: Cardiology

## 2019-08-20 ENCOUNTER — Other Ambulatory Visit: Payer: Self-pay

## 2019-08-20 ENCOUNTER — Encounter (HOSPITAL_COMMUNITY)
Admission: RE | Disposition: A | Discharge status: Home / Self Care | Payer: Self-pay | Source: Home / Self Care | Attending: Cardiology

## 2019-08-20 DIAGNOSIS — I471 Supraventricular tachycardia: Secondary | ICD-10-CM | POA: Diagnosis not present

## 2019-08-20 DIAGNOSIS — Z6841 Body Mass Index (BMI) 40.0 and over, adult: Secondary | ICD-10-CM | POA: Insufficient documentation

## 2019-08-20 DIAGNOSIS — I340 Nonrheumatic mitral (valve) insufficiency: Secondary | ICD-10-CM

## 2019-08-20 DIAGNOSIS — I480 Paroxysmal atrial fibrillation: Secondary | ICD-10-CM | POA: Insufficient documentation

## 2019-08-20 DIAGNOSIS — Z7901 Long term (current) use of anticoagulants: Secondary | ICD-10-CM | POA: Diagnosis not present

## 2019-08-20 DIAGNOSIS — R0683 Snoring: Secondary | ICD-10-CM | POA: Insufficient documentation

## 2019-08-20 DIAGNOSIS — R400 Somnolence: Secondary | ICD-10-CM | POA: Insufficient documentation

## 2019-08-20 DIAGNOSIS — I4892 Unspecified atrial flutter: Secondary | ICD-10-CM | POA: Diagnosis not present

## 2019-08-20 DIAGNOSIS — E669 Obesity, unspecified: Secondary | ICD-10-CM | POA: Insufficient documentation

## 2019-08-20 DIAGNOSIS — G4733 Obstructive sleep apnea (adult) (pediatric): Secondary | ICD-10-CM | POA: Diagnosis not present

## 2019-08-20 DIAGNOSIS — I4891 Unspecified atrial fibrillation: Secondary | ICD-10-CM

## 2019-08-20 DIAGNOSIS — Z79899 Other long term (current) drug therapy: Secondary | ICD-10-CM | POA: Diagnosis not present

## 2019-08-20 DIAGNOSIS — E785 Hyperlipidemia, unspecified: Secondary | ICD-10-CM | POA: Diagnosis not present

## 2019-08-20 HISTORY — PX: CARDIOVERSION: SHX1299

## 2019-08-20 HISTORY — PX: TEE WITHOUT CARDIOVERSION: SHX5443

## 2019-08-20 SURGERY — ECHOCARDIOGRAM, TRANSESOPHAGEAL
Anesthesia: General

## 2019-08-20 MED ORDER — PROPOFOL 500 MG/50ML IV EMUL
INTRAVENOUS | Status: DC | PRN
  Administered 2019-08-20: 100 ug/kg/min via INTRAVENOUS

## 2019-08-20 MED ORDER — SODIUM CHLORIDE 0.9 % IV SOLN: INTRAVENOUS | Status: DC

## 2019-08-20 MED ORDER — PROPOFOL 10 MG/ML IV BOLUS
INTRAVENOUS | Status: DC | PRN
  Administered 2019-08-20 (×2): 20 mg via INTRAVENOUS
  Administered 2019-08-20: 10 mg via INTRAVENOUS

## 2019-08-20 NOTE — Transfer of Care (Signed)
Immediate Anesthesia Transfer of Care Note  Patient: Sandra Stark  Procedure(s) Performed: TRANSESOPHAGEAL ECHOCARDIOGRAM (TEE) (N/A ) CARDIOVERSION (N/A )  Patient Location: Endoscopy Unit  Anesthesia Type:General  Level of Consciousness: awake  Airway & Oxygen Therapy: Patient Spontanous Breathing and Patient connected to nasal cannula oxygen  Post-op Assessment: Report given to RN, Post -op Vital signs reviewed and stable and Patient moving all extremities  Post vital signs: Reviewed and stable  Last Vitals:  Vitals Value Taken Time  BP 103/70 08/20/19 1232  Temp    Pulse 94 08/20/19 1233  Resp 21 08/20/19 1233  SpO2 97 % 08/20/19 1233  Vitals shown include unvalidated device data.  Last Pain:  Vitals:   08/20/19 1133  TempSrc: Axillary  PainSc: 0-No pain         Complications: No apparent anesthesia complications

## 2019-08-20 NOTE — Discharge Instructions (Signed)
Electrical Cardioversion Electrical cardioversion is the delivery of a jolt of electricity to restore a normal rhythm to the heart. A rhythm that is too fast or is not regular keeps the heart from pumping well. In this procedure, sticky patches or metal paddles are placed on the chest to deliver electricity to the heart from a device. This procedure may be done in an emergency if:  There is low or no blood pressure as a result of the heart rhythm.  Normal rhythm must be restored as fast as possible to protect the brain and heart from further damage.  It may save a life. This may also be a scheduled procedure for irregular or fast heart rhythms that are not immediately life-threatening. Tell a health care provider about:  Any allergies you have.  All medicines you are taking, including vitamins, herbs, eye drops, creams, and over-the-counter medicines.  Any problems you or family members have had with anesthetic medicines.  Any blood disorders you have.  Any surgeries you have had.  Any medical conditions you have.  Whether you are pregnant or may be pregnant. What are the risks? Generally, this is a safe procedure. However, problems may occur, including:  Allergic reactions to medicines.  A blood clot that breaks free and travels to other parts of your body.  The possible return of an abnormal heart rhythm within hours or days after the procedure.  Your heart stopping (cardiac arrest). This is rare. What happens before the procedure? Medicines  Your health care provider may have you start taking: ? Blood-thinning medicines (anticoagulants) so your blood does not clot as easily. ? Medicines to help stabilize your heart rate and rhythm.  Ask your health care provider about: ? Changing or stopping your regular medicines. This is especially important if you are taking diabetes medicines or blood thinners. ? Taking medicines such as aspirin and ibuprofen. These medicines can  thin your blood. Do not take these medicines unless your health care provider tells you to take them. ? Taking over-the-counter medicines, vitamins, herbs, and supplements. General instructions  Follow instructions from your health care provider about eating or drinking restrictions.  Plan to have someone take you home from the hospital or clinic.  If you will be going home right after the procedure, plan to have someone with you for 24 hours.  Ask your health care provider what steps will be taken to help prevent infection. These may include washing your skin with a germ-killing soap. What happens during the procedure?   An IV will be inserted into one of your veins.  Sticky patches (electrodes) or metal paddles may be placed on your chest.  You will be given a medicine to help you relax (sedative).  An electrical shock will be delivered. The procedure may vary among health care providers and hospitals. What can I expect after the procedure?  Your blood pressure, heart rate, breathing rate, and blood oxygen level will be monitored until you leave the hospital or clinic.  Your heart rhythm will be watched to make sure it does not change.  You may have some redness on the skin where the shocks were given. Follow these instructions at home:  Do not drive for 24 hours if you were given a sedative during your procedure.  Take over-the-counter and prescription medicines only as told by your health care provider.  Ask your health care provider how to check your pulse. Check it often.  Rest for 48 hours after the procedure or   as told by your health care provider.  Avoid or limit your caffeine use as told by your health care provider.  Keep all follow-up visits as told by your health care provider. This is important. Contact a health care provider if:  You feel like your heart is beating too quickly or your pulse is not regular.  You have a serious muscle cramp that does not go  away. Get help right away if:  You have discomfort in your chest.  You are dizzy or you feel faint.  You have trouble breathing or you are short of breath.  Your speech is slurred.  You have trouble moving an arm or leg on one side of your body.  Your fingers or toes turn cold or blue. Summary  Electrical cardioversion is the delivery of a jolt of electricity to restore a normal rhythm to the heart.  This procedure may be done right away in an emergency or may be a scheduled procedure if the condition is not an emergency.  Generally, this is a safe procedure.  After the procedure, check your pulse often as told by your health care provider. This information is not intended to replace advice given to you by your health care provider. Make sure you discuss any questions you have with your health care provider. Document Revised: 11/05/2018 Document Reviewed: 11/05/2018 Elsevier Patient Education  2020 [employer]  

## 2019-08-20 NOTE — H&P (Signed)
Oliver Barre, PA    Oliver Barre, [place]  Physician Assistant  Electrophysiology     Progress Notes      Signed     Date of Service:  08/16/2019  1:30 PM        Related encounter: ATRIAL FIB OFFICE VISIT from 08/16/2019 in [place] buttonCollapse widget button    Show:Clear all   ManualTemplateCopied  Added by:     Oliver Barre, PA   Hover for detailscustomization button                                                                                                                                                                     untitled image        Primary Care Physician: Raina Mina., MD  Primary Cardiologist: Dr Aundra Dubin  Primary Electrophysiologist: Dr Curt Bears  Referring Physician: Dr Curt Bears        Sandra Stark is a 54 y.o. female with a history of paroxysmal atrial fibrillation, atrial flutter, atrial tachycardia, OSA, HLD who presents for follow up in the [facility].  The patient was initially diagnosed with atrial arrhythmias in 2014. She has been maintained on flecainide and metoprolol. Patient is not on anticoagulation for a CHADS2VASC score of 1. She was in her usual state of health until 08/15/19 when she presented to the ED with generalized fatigue for the last two weeks and was found to be in rapid atrial flutter. She was rate controlled and discharged. Patient states she was being treated for an URI with prednisone and abx prior to the onset of symptoms.     On follow up today, she continues to have generalized fatigue. She has not yet picked up her increased dose of BB. She is in rapid atrial flutter today. She admits to severe snoring,  daytime somnolence, and witnessed apnea.      Today, she denies symptoms of palpitations, chest pain, shortness of breath, orthopnea, PND, lower extremity edema, dizziness, presyncope, syncope, bleeding, or neurologic sequela. The patient is tolerating medications without difficulties and is otherwise without complaint today.         Atrial Fibrillation Risk Factors:     she does have symptoms or diagnosis of sleep apnea.  she is not compliant with CPAP therapy.  she does not have a history of rheumatic fever.  she does not have a history of alcohol use.  The patient does not have a history of early familial atrial fibrillation or other arrhythmias.  she has a BMI of Body mass index is 48.36 kg/m.Marland Kitchen      Filed Weights        08/16/19 1331    Weight:   131.8 kg                Family History    Problem   Relation   Age of Onset    .   Colon cancer   Mother        .   Rectal cancer   Mother        .   Heart disease   Other                several family members on dad's side have had heart issues - earliest in their 80's    .   Stroke   Paternal Grandfather        .   Diabetes   Paternal Grandfather        .   Breast cancer   Sister        .   Colon polyps   Sister                x 2    .   Stroke   Paternal Grandmother        .   Diabetes   Paternal Grandmother                 Atrial Fibrillation Management history:     Previous antiarrhythmic drugs: flecainide   Previous cardioversions: none  Previous ablations: none  CHADS2VASC score: 1  Anticoagulation history: none             Past Medical History:    Diagnosis   Date    .   Atrial tachycardia ([place])            a. Dx 03/2013.    .   Chest pain            a. 03/2013: cath negative for CAD, no PE by CT angio.    Marland Kitchen   Hyperglycemia            a. A1c 5.8  in 03/2013.    Marland Kitchen   Hyperlipidemia            a. Monitored by PCP - elected to pursue lifestyle modification in 09/2012 with reassessment pending.    Marland Kitchen   PAC (premature atrial contraction)            a. Dx 03/2013.    Marland Kitchen   PAF (paroxysmal atrial fibrillation) ([place])            a. Dx 03/2013 - started on flecainide. CHADSVASC = 1 so no anticoag at present time.    .   Paroxysmal atrial flutter ([place])            a. Dx 03/2013.    .   Pulmonary nodule, right            a. 13mm RML by CT angio 03/2013.             Past Surgical History:    Procedure   Laterality   Date    .   APPENDECTOMY            .   CHOLECYSTECTOMY            .   HERNIA REPAIR            .  LEFT HEART CATHETERIZATION WITH CORONARY ANGIOGRAM   N/A   03/28/2013        Procedure: LEFT HEART CATHETERIZATION WITH CORONARY ANGIOGRAM;  Surgeon: Ramond Dial, MD;  Location: [facility] CATH LAB;  Service: Cardiovascular;  Laterality: N/A;                 Current Outpatient Medications    Medication   Sig   Dispense   Refill    .   ascorbic acid (VITAMIN C) 500 MG tablet   Take 1,000 mg by mouth daily.            .   flecainide (TAMBOCOR) 50 MG tablet   Take 1 tablet (50 mg total) by mouth 2 (two) times daily.   180 tablet   3    .   loratadine (CLARITIN) 10 MG tablet   Take 10 mg by mouth daily.            .   metoprolol succinate (TOPROL-XL) 25 MG 24 hr tablet   TAKE 1 TABLET BY MOUTH DAILY.   90 tablet   3    .   metoprolol tartrate (LOPRESSOR) 50 MG tablet   Take 1 tablet (50 mg total) by mouth daily as needed for up to 1 dose (consistent heart greater than 120 (longer than an hour at rest)).   60 tablet   0    .   pantoprazole (PROTONIX) 40 MG tablet   Take 40 mg by mouth daily.            Marland Kitchen   apixaban (ELIQUIS) 5 MG TABS tablet   Take 1 tablet (5 mg total)  by mouth 2 (two) times daily.   60 tablet   3        No current facility-administered medications for this encounter.          No Known Allergies      Social History             Socioeconomic History    .   Marital status:   Married            Spouse name:   Not on file    .   Number of children:   2    .   Years of education:   Not on file    .   Highest education level:   Not on file    Occupational History            Employer:   [employer]            Comment: Adminstrative assistance    Tobacco Use    .   Smoking status:   Never Smoker    .   Smokeless tobacco:   Never Used    Substance and Sexual Activity    .   Alcohol use:   Yes            Comment: rare - 2x/month    .   Drug use:   No    .   Sexual activity:   Yes    Other Topics   Concern    .   Not on file    Social History Narrative    .   Not on file        Social Determinants of Health           Financial Resource Strain:     .  Difficulty of Paying Living Expenses:     Food Insecurity:     .   Worried About Charity fundraiser in the Last Year:     .   Arboriculturist in the Last Year:     Transportation Needs:     .   Film/video editor (Medical):     Marland Kitchen   Lack of Transportation (Non-Medical):     Physical Activity:     .   Days of Exercise per Week:     .   Minutes of Exercise per Session:     Stress:     .   Feeling of Stress :     Social Connections:     .   Frequency of Communication with Friends and Family:     .   Frequency of Social Gatherings with Friends and Family:     .   Attends Religious Services:     .   Active Member of Clubs or Organizations:     .   Attends Archivist Meetings:     Marland Kitchen   Marital Status:     Intimate Partner Violence:     .   Fear of Current or  Ex-Partner:     .   Emotionally Abused:     Marland Kitchen   Physically Abused:     .   Sexually Abused:              ROS- All systems are reviewed and negative except as per the HPI above.     Physical Exam:      Vitals:        08/16/19 1331    BP:   110/88    Pulse:   (!) 130    Weight:   131.8 kg    Height:   5\' 5"  (1.651 m)          GEN- The patient is well appearing obese female, alert and oriented x 3 today.    Head- normocephalic, atraumatic  Eyes-  Sclera clear, conjunctiva pink  Ears- hearing intact  Oropharynx- clear  Neck- supple   Lungs- Clear to ausculation bilaterally, normal work of breathing  Heart- irregular rate and rhythm, tachycardia, no murmurs, rubs or gallops   GI- soft, NT, ND, + BS  Extremities- no clubbing, cyanosis, or edema  MS- no significant deformity or atrophy  Skin- no rash or lesion  Psych- euthymic mood, full affect  Neuro- strength and sensation are intact         Wt Readings from Last 3 Encounters:    08/16/19   131.8 kg    08/15/19   124.7 kg    03/19/19   130.7 kg          EKG today demonstrates typical atrial flutter with variable conduction with salvos of atrial tach vs 2:1 atrial flutter conduction HR 130, QRS 86, QTc 456     Echo 03/29/19 demonstrated    1. Left ventricular ejection fraction, by visual estimation, is 60 to  65%. The left ventricle has normal function. There is no left ventricular  hypertrophy.   2. The left ventricle has no regional wall motion abnormalities.   3. Global right ventricle has normal systolic function.The right  ventricular size is normal. No increase in right ventricular wall  thickness.   4. Left atrial size was normal.   5. Right atrial size was normal.   6. The mitral valve is  normal in structure. Trivial mitral valve  regurgitation. No evidence of mitral stenosis.   7. The tricuspid valve is normal in structure.  Tricuspid valve  regurgitation is not demonstrated.   8. The aortic valve was not well visualized. Aortic valve regurgitation  is not visualized. Mild to moderate aortic valve sclerosis/calcification  without any evidence of aortic stenosis.   9. The pulmonic valve was normal in structure. Pulmonic valve  regurgitation is not visualized.  10. Normal pulmonary artery systolic pressure.  11. The inferior vena cava is normal in size with greater than 50%  respiratory variability, suggesting right atrial pressure of 3 mmHg.      Epic records are reviewed at length today     CHA2DS2-VASc Score = 1   The patient's score is based upon:  CHF History: 0  HTN History: 0  Age : 0  Diabetes History: 0  Stroke History: 0  Vascular Disease History: 0  Gender: 1            ASSESSMENT AND PLAN:  1. Paroxysmal Atrial Fibrillation/atrial flutter/atrial tachycardia  The patient's CHA2DS2-VASc score is 1, indicating a 0.6% annual risk of stroke.    General education about afib/flutter provided and questions answered. We also discussed her stroke and the risks and benefits of anticoagulation.   Will plan to start Eliquis 5 mg BID given her symptoms have been well beyond 48 hours.   We discussed TEE/DCCV and patient is agreeable. Will arrange after 5 doses of Eliquis.   Continue Toprol 25 mg daily.  Patient to start Lopressor 50 mg q 6 hours for heart rate >100.   Continue flecainide 50 mg BID. Could consider increasing this in the future. Will continue present dose so she does not chemically convert without adequate anticoagulation.  Lifestyle modification as below.     2. Obesity  Body mass index is 48.36 kg/m.  Lifestyle modification was discussed at length including regular exercise and weight reduction.     3. Obstructive sleep apnea  Patient having symptoms of daytime somnolence, severe snoring, and witnessed apnea. The importance of adequate treatment of  sleep apnea was discussed today in order to improve our ability to maintain sinus rhythm long term.  Patient agreeable for sleep study. Will refer.        Follow up in the AF clinic one week post DCCV.        [facility]  [place], [place] [address]  [phone]  08/16/2019  2:16 PM        For TEE/DCCV; no changes. Kirk Ruths

## 2019-08-20 NOTE — Progress Notes (Signed)
*  PRELIMINARY RESULTS* Echocardiogram Echocardiogram Transesophageal has been performed.  Sandra Stark 08/20/2019, 1:03 PM

## 2019-08-20 NOTE — Anesthesia Preprocedure Evaluation (Addendum)
Anesthesia Evaluation  Patient identified by MRN, date of birth, ID band Patient awake    Reviewed: Allergy & Precautions, NPO status , Patient's Chart, lab work & pertinent test results  History of Anesthesia Complications Negative for: history of anesthetic complications  Airway Mallampati: IV  TM Distance: >3 FB Neck ROM: Full    Dental  (+) Teeth Intact, Dental Advisory Given   Pulmonary sleep apnea ,    breath sounds clear to auscultation       Cardiovascular + dysrhythmias Atrial Fibrillation  Rhythm:Regular  IMPRESSIONS    1. Left ventricular ejection fraction, by visual estimation, is 60 to  65%. The left ventricle has normal function. There is no left ventricular  hypertrophy.  2. The left ventricle has no regional wall motion abnormalities.  3. Global right ventricle has normal systolic function.The right  ventricular size is normal. No increase in right ventricular wall  thickness.  4. Left atrial size was normal.  5. Right atrial size was normal.  6. The mitral valve is normal in structure. Trivial mitral valve  regurgitation. No evidence of mitral stenosis.  7. The tricuspid valve is normal in structure. Tricuspid valve  regurgitation is not demonstrated.  8. The aortic valve was not well visualized. Aortic valve regurgitation  is not visualized. Mild to moderate aortic valve sclerosis/calcification  without any evidence of aortic stenosis.  9. The pulmonic valve was normal in structure. Pulmonic valve  regurgitation is not visualized.  10. Normal pulmonary artery systolic pressure.  11. The inferior vena cava is normal in size with greater than 50%  respiratory variability, suggesting right atrial pressure of 3 mmHg.    Neuro/Psych negative neurological ROS     GI/Hepatic negative GI ROS, Neg liver ROS,   Endo/Other  Morbid obesity  Renal/GU negative Renal ROS     Musculoskeletal negative  musculoskeletal ROS (+)   Abdominal   Peds  Hematology negative hematology ROS (+)   Anesthesia Other Findings Day of surgery medications reviewed with the patient.  Reproductive/Obstetrics                         Anesthesia Physical Anesthesia Plan  ASA: III  Anesthesia Plan: General   Post-op Pain Management:    Induction: Intravenous  PONV Risk Score and Plan: Propofol infusion  Airway Management Planned: Natural Airway and Nasal Cannula  Additional Equipment: None  Intra-op Plan:   Post-operative Plan:   Informed Consent: I have reviewed the patients History and Physical, chart, labs and discussed the procedure including the risks, benefits and alternatives for the proposed anesthesia with the patient or authorized representative who has indicated his/her understanding and acceptance.     Dental advisory given  Plan Discussed with: Anesthesiologist and CRNA  Anesthesia Plan Comments:       Anesthesia Quick Evaluation

## 2019-08-20 NOTE — Progress Notes (Signed)
    Transesophageal Echocardiogram Note  Sandra Stark March 25, 1966  Procedure: Transesophageal Echocardiogram Indications: Atrial fibrillation  Procedure Details Consent: Obtained Time Out: Verified patient identification, verified procedure, site/side was marked, verified correct patient position, special equipment/implants available, Radiology Safety Procedures followed,  medications/allergies/relevent history reviewed, required imaging and test results available.  Performed  Medications:  Pt sedated by anesthesia with diprovan 200 mg IV total.  Normal LV function; mild MR and TR; mild LAE; no LAA thrombus. Pt subsequently cardioverted with 120 J to NSR. Continue apixaban.   Complications: No apparent complications Patient did tolerate procedure well.  Kirk Ruths, MD

## 2019-08-21 NOTE — Anesthesia Postprocedure Evaluation (Signed)
Anesthesia Post Note  Patient: Sandra Stark  Procedure(s) Performed: TRANSESOPHAGEAL ECHOCARDIOGRAM (TEE) (N/A ) CARDIOVERSION (N/A )     Patient location during evaluation: Endoscopy Anesthesia Type: General Level of consciousness: patient cooperative and awake Pain management: pain level controlled Vital Signs Assessment: post-procedure vital signs reviewed and stable Respiratory status: spontaneous breathing, nonlabored ventilation, respiratory function stable and patient connected to nasal cannula oxygen Cardiovascular status: stable Postop Assessment: no apparent nausea or vomiting Anesthetic complications: no    Last Vitals:  Vitals:   08/20/19 1243 08/20/19 1250  BP: 108/61 107/73  Pulse: 94 95  Resp: (!) 22 (!) 21  Temp:    SpO2: 96% 98%    Last Pain:  Vitals:   08/20/19 1250  TempSrc:   PainSc: 0-No pain                 Gricelda Foland

## 2019-08-26 ENCOUNTER — Emergency Department (HOSPITAL_COMMUNITY): Payer: Medicaid Other

## 2019-08-26 ENCOUNTER — Encounter (HOSPITAL_COMMUNITY): Payer: Self-pay | Admitting: Emergency Medicine

## 2019-08-26 ENCOUNTER — Emergency Department (HOSPITAL_COMMUNITY)
Admission: EM | Admit: 2019-08-26 | Discharge: 2019-08-26 | Disposition: A | Discharge status: Home / Self Care | Payer: Medicaid Other | Attending: Emergency Medicine | Admitting: Emergency Medicine

## 2019-08-26 DIAGNOSIS — R531 Weakness: Secondary | ICD-10-CM | POA: Diagnosis present

## 2019-08-26 DIAGNOSIS — R42 Dizziness and giddiness: Secondary | ICD-10-CM | POA: Insufficient documentation

## 2019-08-26 DIAGNOSIS — R0602 Shortness of breath: Secondary | ICD-10-CM | POA: Diagnosis not present

## 2019-08-26 DIAGNOSIS — Z7901 Long term (current) use of anticoagulants: Secondary | ICD-10-CM | POA: Insufficient documentation

## 2019-08-26 DIAGNOSIS — I480 Paroxysmal atrial fibrillation: Secondary | ICD-10-CM

## 2019-08-26 DIAGNOSIS — Z79899 Other long term (current) drug therapy: Secondary | ICD-10-CM | POA: Insufficient documentation

## 2019-08-26 DIAGNOSIS — R0789 Other chest pain: Secondary | ICD-10-CM | POA: Diagnosis not present

## 2019-08-26 DIAGNOSIS — I483 Typical atrial flutter: Secondary | ICD-10-CM

## 2019-08-26 LAB — COMPREHENSIVE METABOLIC PANEL
ALT: 25 U/L (ref 0–44)
AST: 26 U/L (ref 15–41)
Albumin: 3.8 g/dL (ref 3.5–5.0)
Alkaline Phosphatase: 112 U/L (ref 38–126)
Anion gap: 14 (ref 5–15)
BUN: 12 mg/dL (ref 6–20)
CO2: 21 mmol/L — ABNORMAL LOW (ref 22–32)
Calcium: 9 mg/dL (ref 8.9–10.3)
Chloride: 106 mmol/L (ref 98–111)
Creatinine, Ser: 0.95 mg/dL (ref 0.44–1.00)
GFR calc Af Amer: >60 mL/min (ref >60)
GFR calc non Af Amer: >60 mL/min (ref >60)
Glucose, Bld: 137 mg/dL — ABNORMAL HIGH (ref 70–99)
Potassium: 4.1 mmol/L (ref 3.5–5.1)
Sodium: 141 mmol/L (ref 135–145)
Total Bilirubin: 0.8 mg/dL (ref 0.3–1.2)
Total Protein: 6.8 g/dL (ref 6.5–8.1)

## 2019-08-26 LAB — CBC WITH DIFFERENTIAL/PLATELET
Abs Immature Granulocytes: 0.02 10*3/uL (ref 0.00–0.07)
Basophils Absolute: 0 10*3/uL (ref 0.0–0.1)
Basophils Relative: 1 %
Eosinophils Absolute: 0.1 10*3/uL (ref 0.0–0.5)
Eosinophils Relative: 1 %
HCT: 42.5 % (ref 36.0–46.0)
Hemoglobin: 13.9 g/dL (ref 12.0–15.0)
Immature Granulocytes: 0 %
Lymphocytes Relative: 31 %
Lymphs Abs: 1.8 10*3/uL (ref 0.7–4.0)
MCH: 31.3 pg (ref 26.0–34.0)
MCHC: 32.7 g/dL (ref 30.0–36.0)
MCV: 95.7 fL (ref 80.0–100.0)
Monocytes Absolute: 0.5 10*3/uL (ref 0.1–1.0)
Monocytes Relative: 8 %
Neutro Abs: 3.5 10*3/uL (ref 1.7–7.7)
Neutrophils Relative %: 59 %
Platelets: 242 10*3/uL (ref 150–400)
RBC: 4.44 MIL/uL (ref 3.87–5.11)
RDW: 13.1 % (ref 11.5–15.5)
WBC: 5.8 10*3/uL (ref 4.0–10.5)
nRBC: 0 % (ref 0.0–0.2)

## 2019-08-26 LAB — MAGNESIUM: Magnesium: 2 mg/dL (ref 1.7–2.4)

## 2019-08-26 MED ORDER — PROPOFOL 10 MG/ML IV BOLUS
100.0000 mg | Freq: Once | INTRAVENOUS | Status: AC
  Administered 2019-08-26: 100 mg via INTRAVENOUS
  Filled 2019-08-26: qty 20

## 2019-08-26 MED ORDER — METOPROLOL TARTRATE 5 MG/5ML IV SOLN
5.0000 mg | Freq: Once | INTRAVENOUS | Status: AC
  Administered 2019-08-26: 5 mg via INTRAVENOUS
  Filled 2019-08-26: qty 5

## 2019-08-26 MED ORDER — PROPOFOL 10 MG/ML IV BOLUS
INTRAVENOUS | Status: AC | PRN
  Administered 2019-08-26: 100 mg via INTRAVENOUS

## 2019-08-26 MED ORDER — FLECAINIDE ACETATE 100 MG PO TABS: 100.0000 mg | ORAL_TABLET | Freq: Every day | ORAL | 0 refills | Status: DC

## 2019-08-26 MED ORDER — METOPROLOL TARTRATE 25 MG PO TABS: 50.0000 mg | ORAL_TABLET | Freq: Once | ORAL | Status: DC

## 2019-08-26 NOTE — Progress Notes (Signed)
RRT at bedside throughout for the procedural sedation due to arrhthymias specifically  due to A-fib. Goal was to maintain good ventilation and optimize oxygenation to meet systemic and myocardial oxygen demands throughout the procedure. No complications noted. Patient is now in NSR. MD at bedside

## 2019-08-26 NOTE — ED Provider Notes (Signed)
[facility] EMERGENCY DEPARTMENT Provider Note   CSN: [id number] Arrival date & time: 08/26/19  1534     History Chief Complaint  Patient presents with  . Weakness    Sandra Stark is a 54 y.o. female with past medical history of a flutter, hyperglycemia, hyperlipidemia, obesity, who presents today for evaluation of not feeling well.  She has recently been seen and evaluated as she was in a flutter despite taking her flecainide.  She was seen in the emergency room on 429 for a flutter, she was seen in the A. fib office on 4/30 and started on Eliquis.  She reports compliance with her Eliquis and she has not missed any doses.  She had a TEE performed on 08/20/2019 without evidence of any thrombus and was cardioverted.  She reports that at about 130 today she started feeling poorly.  She states that she felt short of breath and could feel her palpitations.  She denies any nausea or vomiting.  She reports feeling like she has a band around her chest and her head.  This started about an hour after she was started feeling for palpitations.  She denies any stimulant.  She does not drink any coffee.  She states that she got lightheaded while walking downstairs after this started.  She does report that she has had mild swelling in her legs and feet, this is not normal for her.  She reports that until today with the sudden change she felt well after her cardioversion.  She denies any fevers.  She denies any inciting event.  HPI     Past Medical History:  Diagnosis Date  . Atrial tachycardia ([place])    a. Dx 03/2013.  . Chest pain    a. 03/2013: cath negative for CAD, no PE by CT angio.  Marland Kitchen Hyperglycemia    a. A1c 5.8 in 03/2013.  Marland Kitchen Hyperlipidemia    a. Monitored by PCP - elected to pursue lifestyle modification in 09/2012 with reassessment pending.  Marland Kitchen PAC (premature atrial contraction)    a. Dx 03/2013.  Marland Kitchen PAF (paroxysmal atrial fibrillation) ([place])    a. Dx 03/2013 - started on  flecainide. CHADSVASC = 1 so no anticoag at present time.  . Paroxysmal atrial flutter ([place])    a. Dx 03/2013.  . Pulmonary nodule, right    a. 72mm RML by CT angio 03/2013.    Patient Active Problem List   Diagnosis Date Noted  . Typical atrial flutter ([place]) 08/16/2019  . OSA (obstructive sleep apnea) 06/25/2013  . Pulmonary nodule 04/08/2013  . PAF (paroxysmal atrial fibrillation) ([place]) 03/29/2013  . Atrial tachycardia ([place]) 03/29/2013  . Sinus bradycardia 03/29/2013  . Pulmonary nodule, right   . Hyperglycemia   . Chest pain 03/26/2013  . Hyperlipidemia 03/26/2013  . Obesity 03/26/2013  . Paroxysmal atrial flutter ([place]) 03/26/2013  . PAC (premature atrial contraction) 03/26/2013    Past Surgical History:  Procedure Laterality Date  . APPENDECTOMY    . CARDIOVERSION N/A 08/20/2019   Procedure: CARDIOVERSION;  Surgeon: Lelon Perla, MD;  Location: [facility] ENDOSCOPY;  Service: Cardiovascular;  Laterality: N/A;  . CHOLECYSTECTOMY    . HERNIA REPAIR    . LEFT HEART CATHETERIZATION WITH CORONARY ANGIOGRAM N/A 03/28/2013   Procedure: LEFT HEART CATHETERIZATION WITH CORONARY ANGIOGRAM;  Surgeon: Ramond Dial, MD;  Location: [facility] CATH LAB;  Service: Cardiovascular;  Laterality: N/A;  . TEE WITHOUT CARDIOVERSION N/A 08/20/2019   Procedure: TRANSESOPHAGEAL ECHOCARDIOGRAM (TEE);  Surgeon: Lelon Perla, MD;  Location: [facility] ENDOSCOPY;  Service: Cardiovascular;  Laterality: N/A;     OB History   No obstetric history on file.     Family History  Problem Relation Age of Onset  . Colon cancer Mother   . Rectal cancer Mother   . Heart disease Other        several family members on dad's side have had heart issues - earliest in their 84's  . Stroke Paternal Grandfather   . Diabetes Paternal Grandfather   . Breast cancer Sister   . Colon polyps Sister        x 2  . Stroke Paternal Grandmother   . Diabetes Paternal Grandmother     Social History   Tobacco Use  . Smoking  status: Never Smoker  . Smokeless tobacco: Never Used  Substance Use Topics  . Alcohol use: Yes    Comment: rare - 2x/month  . Drug use: No    Home Medications Prior to Admission medications   Medication Sig Start Date End Date Taking? Authorizing Provider  apixaban (ELIQUIS) 5 MG TABS tablet Take 1 tablet (5 mg total) by mouth 2 (two) times daily. 08/16/19  Yes Fenton, Clint R, PA  ascorbic acid (VITAMIN C) 500 MG tablet Take 1,000 mg by mouth daily.   Yes [provider]  loratadine (CLARITIN) 10 MG tablet Take 10 mg by mouth at bedtime.    Yes [provider]  metoprolol succinate (TOPROL-XL) 25 MG 24 hr tablet TAKE 1 TABLET BY MOUTH DAILY. Patient taking differently: Take 25 mg by mouth daily.  04/03/19  Yes Camnitz, Will Hassell Done, MD  pantoprazole (PROTONIX) 40 MG tablet Take 40 mg by mouth daily.   Yes [provider]  flecainide (TAMBOCOR) 100 MG tablet Take 1 tablet (100 mg total) by mouth daily. 08/26/19 09/25/19  Lorin Glass, PA-C  metoprolol tartrate (LOPRESSOR) 50 MG tablet Take 1 tablet (50 mg total) by mouth daily as needed for up to 1 dose (consistent heart greater than 120 (longer than an hour at rest)). Patient not taking: Reported on 08/26/2019 08/16/19   Mesner, Corene Cornea, MD    Allergies    Patient has no known allergies.  Review of Systems   Review of Systems  Constitutional: Negative for chills and fever.  Eyes: Negative for visual disturbance.  Respiratory: Positive for chest tightness and shortness of breath. Negative for cough.   Cardiovascular: Negative for chest pain. Leg swelling: Not abnormal for patient.   Gastrointestinal: Negative for abdominal pain, diarrhea, nausea and vomiting.  Genitourinary: Negative for dysuria.  Musculoskeletal: Negative for back pain.  Skin: Negative for color change and wound.  Neurological: Positive for light-headedness (When going on stairs since felt heart rate increase). Negative for weakness and  headaches.  Psychiatric/Behavioral: Negative for confusion.  All other systems reviewed and are negative.   Physical Exam Updated Vital Signs BP 116/73   Pulse 91   Temp 98.1 F (36.7 C) (Oral)   Resp 14   Ht 5\' 5"  (1.651 m)   Wt 127 kg   LMP 08/14/2019   SpO2 99%   BMI 46.59 kg/m   Physical Exam Vitals and nursing note reviewed.  Constitutional:      General: She is not in acute distress.    Appearance: She is well-developed. She is obese. She is not diaphoretic.  HENT:     Head: Normocephalic and atraumatic.  Eyes:     General: No scleral icterus.  Right eye: No discharge.        Left eye: No discharge.     Conjunctiva/sclera: Conjunctivae normal.  Cardiovascular:     Rate and Rhythm: Regular rhythm. Tachycardia present.     Pulses: Normal pulses.     Heart sounds: Normal heart sounds.  Pulmonary:     Effort: Pulmonary effort is normal. No respiratory distress.     Breath sounds: Normal breath sounds. No stridor.  Abdominal:     General: There is no distension.     Tenderness: There is no abdominal tenderness.  Musculoskeletal:        General: No deformity.     Cervical back: Normal range of motion and neck supple.     Right lower leg: Edema present.     Left lower leg: Edema present.     Comments: Edema is symmetric bilaterally.   Skin:    General: Skin is warm and dry.     Capillary Refill: Capillary refill takes less than 2 seconds.  Neurological:     General: No focal deficit present.     Mental Status: She is alert.     Motor: No abnormal muscle tone.  Psychiatric:        Mood and Affect: Mood normal.        Behavior: Behavior normal.     ED Results / Procedures / Treatments   Labs (all labs ordered are listed, but only abnormal results are displayed) Labs Reviewed  COMPREHENSIVE METABOLIC PANEL - Abnormal; Notable for the following components:      Result Value   CO2 21 (*)    Glucose, Bld 137 (*)    All other components within normal  limits  CBC WITH DIFFERENTIAL/PLATELET  MAGNESIUM    EKG EKG Interpretation  Date/Time:  Monday Aug 26 2019 20:44:36 EDT Ventricular Rate:  86 PR Interval:    QRS Duration: 96 QT Interval:  360 QTC Calculation: 431 R Axis:   22 Text Interpretation: Sinus rhythm Low voltage, precordial leads No STEMI Confirmed by Octaviano Glow [phone]) on 08/26/2019 8:55:15 PM   Radiology DG Chest Port 1 View  Result Date: 08/26/2019 CLINICAL DATA:  Shortness of breath, atrial fibrillation EXAM: PORTABLE CHEST 1 VIEW COMPARISON:  08/15/2019 FINDINGS: The heart size and mediastinal contours are within normal limits. Both lungs are clear. The visualized skeletal structures are unremarkable. IMPRESSION: No active disease. Electronically Signed   By: Donavan Foil M.D.   On: 08/26/2019 16:37    Procedures .Cardioversion  Date/Time: 08/26/2019 9:12 PM Performed by: Lorin Glass, PA-C Authorized by: Lorin Glass, PA-C   Consent:    Consent obtained:  Written and verbal   Consent given by:  Patient   Risks discussed:  Cutaneous burn, death, induced arrhythmia and pain   Alternatives discussed:  No treatment, rate-control medication and alternative treatment Pre-procedure details:    Cardioversion basis:  Emergent   Rhythm:  Atrial flutter   Electrode placement:  Anterior-posterior Patient sedated: Yes. Refer to sedation procedure documentation for details of sedation.  Attempt one:    Cardioversion mode:  Synchronous   Shock (Joules):  120   Shock outcome:  Conversion to normal sinus rhythm Post-procedure details:    Patient status:  Awake   Patient tolerance of procedure:  Tolerated well, no immediate complications .Critical Care Performed by: Lorin Glass, PA-C Authorized by: Lorin Glass, PA-C   Critical care provider statement:    Critical care time (minutes):  45  Critical care was necessary to treat or prevent imminent or life-threatening  deterioration of the following conditions:  Cardiac failure   Critical care was time spent personally by me on the following activities:  Discussions with consultants, evaluation of patient's response to treatment, examination of patient, ordering and performing treatments and interventions, ordering and review of laboratory studies, ordering and review of radiographic studies, pulse oximetry, re-evaluation of patient's condition, obtaining history from patient or surrogate and review of old charts   (including critical care time)  Medications Ordered in ED Medications  propofol (DIPRIVAN) 10 mg/mL bolus/IV push (100 mg Intravenous Given 08/26/19 2042)  metoprolol tartrate (LOPRESSOR) injection 5 mg (5 mg Intravenous Given 08/26/19 1734)  propofol (DIPRIVAN) 10 mg/mL bolus/IV push 100 mg (100 mg Intravenous Given 08/26/19 2045)    ED Course  I have reviewed the triage vital signs and the nursing notes.  Pertinent labs & imaging results that were available during my care of the patient were reviewed by me and considered in my medical decision making (see chart for details).  Clinical Course as of Aug 26 2111  Mon Aug 25, 5361  126 54 year old female with a history of A. fib status post cardioversion 1 week ago, on Eliquis, on flecainide and metoprolol (25 mg BID), presenting to emergency department in A. fib.  The patient reports onset of symptoms around 1 PM this afternoon.  She noted palpitations and her Fitbit watch told her heart rate was elevated.  Here in the ED she is overall comfortable appearing.  She is afebrile.  Her heart rate is around 140 bpm.  Blood pressure stable.  She is on a respiratory distress.  Her EKG shows an A. fib or flutter pattern with a heart rate of 150 bpm.  Record review demonstrates a relatively unremarkable echocardiogram done 1 week ago, with no evidence of clot.  We will give her a dose of IV metoprolol to see if we can achieve rate control.  Overall low risk for  thromboembolic events with a negative echo so recently and adherence to eliquis regimen.  Doubtful of ACS or PE with this clinical presentation, likewise doubtful of acute infection as inciting event.  Labs pending. We will reach out to cardiology regarding repeat cardioversion in the ED versus rate control and outpatient follow-up.   [MT]  1958 Per Dr. Meda Coffee will increase her flecainide 100mg   if cardioversion is successful.    [EH]  2023 Consented patient for sedation and cardioversion with Dr. Langston Masker.  Electronic consents are in computer.    [EH]  2028 HR is actually in the 140s in the room.   Pulse Rate: 75 [EH]  2053 Successful cardioversion with propofol sedation, now in NSR, awake from anasthesia, will monitor 30 minutes for sedation effect,s anticipate discharge home if stable   [MT]    Clinical Course User Index [EH] Lorin Glass, PA-C [MT] Langston Masker Carola Rhine, MD   MDM Rules/Calculators/A&P                     Patient is a 54 year old woman with a past medical history of a flutter who presents today for evaluation of feeling unwell with rapid heart rate.  She recently had a cardioversion with Dr. Stanford Breed and reports that she has been compliant with her Eliquis since.  She had previously been given metoprolol to use if she had sustained elevated heart rates however she did not try this prior to coming here today.  She is  in a flutter rhythm in the 140s.  She does report mild symptoms including chest tightness and feeling short of breath.  She denies any caffeine use.  On exam she has elevated HR, however is overall regular, consistent with her a-flutter.   Labs were obtained and reviewed showing no significant acute hematologic or electrolyte derangements..  CXR shows No active disease.   I spoke with cardiology Dr. Meda Coffee who recommends increasing flecainide dose to 100 mg if cardioversion is successful and states she will monitor for assisting in close outpatient follow-up.   Patient already has an appointment with the A. fib clinic tomorrow at 130.  Sedation and cardioversion was performed successfully, please see procedure notes.   At shift change care was transferred to Dr. Langston Masker  who will follow pending studies, re-evaulate and determine disposition.     Note: Portions of this report may have been transcribed using voice recognition software. Every effort was made to ensure accuracy; however, inadvertent computerized transcription errors may be present   Final Clinical Impression(s) / ED Diagnoses Final diagnoses:  Typical atrial flutter ([place])    Rx / DC Orders ED Discharge Orders         Ordered    flecainide (TAMBOCOR) 100 MG tablet  Daily    Note to Pharmacy: Please call our office to schedule an overdue appointment with Dr. Curt Bears before anymore refills. [phone]. Thank you 1st attempt   08/26/19 2101           Ollen Gross 08/26/19 2117    Wyvonnia Dusky, MD 08/26/19 2156

## 2019-08-26 NOTE — ED Triage Notes (Signed)
BIB EMS from work. Called out for "not feeling well" Recently seen for a flutter and cardioverted. Noted to be in afib RVR upon EMS arrival. Denies CP but does feel SOB.

## 2019-08-26 NOTE — ED Notes (Signed)
Patient called out stating she suddenly "felt different" Repeat EKG obtained and taken to [facility] and Langston Masker MD

## 2019-08-26 NOTE — ED Notes (Signed)
Discharge instructions reviewed with pt. Pt verbalized understanding.   

## 2019-08-26 NOTE — ED Notes (Signed)
Pt able to eat and drink. Pt ambulatory with a steady gate.

## 2019-08-26 NOTE — Sedation Documentation (Signed)
First shock delivered.

## 2019-08-26 NOTE — Discharge Instructions (Addendum)
Today you received medications that may make you sleepy or impair your ability to make decisions.  For the next 24 hours please do not drive, operate heavy machinery, care for a small child with out another adult present, or perform any activities that may cause harm to you or someone else if you were to fall asleep or be impaired.   We spoke to our cardiologist who recommended changing your flecainide dose to 100 mg by mouth daily.  A new prescription was written for you.  Please follow up in the Atrial Fib clinic tomorrow at 1 pm as originally scheduled.

## 2019-08-27 ENCOUNTER — Encounter (HOSPITAL_COMMUNITY): Payer: Self-pay | Admitting: Physician Assistant

## 2019-08-27 ENCOUNTER — Other Ambulatory Visit: Payer: Self-pay

## 2019-08-27 ENCOUNTER — Ambulatory Visit (HOSPITAL_COMMUNITY)
Admission: RE | Admit: 2019-08-27 | Discharge: 2019-08-27 | Disposition: A | Discharge status: Home / Self Care | Payer: Medicaid Other | Source: Ambulatory Visit | Attending: Physician Assistant | Admitting: Physician Assistant

## 2019-08-27 VITALS — BP 122/78 | HR 86 | Ht 65.0 in | Wt 294.2 lb

## 2019-08-27 DIAGNOSIS — E785 Hyperlipidemia, unspecified: Secondary | ICD-10-CM | POA: Diagnosis not present

## 2019-08-27 DIAGNOSIS — I4892 Unspecified atrial flutter: Secondary | ICD-10-CM | POA: Diagnosis not present

## 2019-08-27 DIAGNOSIS — Z7901 Long term (current) use of anticoagulants: Secondary | ICD-10-CM | POA: Insufficient documentation

## 2019-08-27 DIAGNOSIS — E669 Obesity, unspecified: Secondary | ICD-10-CM | POA: Diagnosis not present

## 2019-08-27 DIAGNOSIS — I480 Paroxysmal atrial fibrillation: Secondary | ICD-10-CM | POA: Insufficient documentation

## 2019-08-27 DIAGNOSIS — G4733 Obstructive sleep apnea (adult) (pediatric): Secondary | ICD-10-CM | POA: Insufficient documentation

## 2019-08-27 DIAGNOSIS — R000 Tachycardia, unspecified: Secondary | ICD-10-CM | POA: Diagnosis not present

## 2019-08-27 DIAGNOSIS — Z79899 Other long term (current) drug therapy: Secondary | ICD-10-CM | POA: Diagnosis not present

## 2019-08-27 DIAGNOSIS — Z8249 Family history of ischemic heart disease and other diseases of the circulatory system: Secondary | ICD-10-CM | POA: Diagnosis not present

## 2019-08-27 DIAGNOSIS — Z6841 Body Mass Index (BMI) 40.0 and over, adult: Secondary | ICD-10-CM | POA: Insufficient documentation

## 2019-08-27 DIAGNOSIS — R0681 Apnea, not elsewhere classified: Secondary | ICD-10-CM

## 2019-08-27 NOTE — Progress Notes (Signed)
Primary Care Physician: Raina Mina., MD Primary Cardiologist: Dr Aundra Dubin Primary Electrophysiologist: Dr Curt Bears Referring Physician: Dr Curt Bears   Sandra Stark is a 54 y.o. female with a history of paroxysmal atrial fibrillation, atrial flutter, atrial tachycardia, OSA, HLD who presents for follow up in the [facility].  The patient was initially diagnosed with atrial arrhythmias in 2014. She has been maintained on flecainide and metoprolol. Patient is not on anticoagulation for a CHADS2VASC score of 1. She was in her usual state of health until 08/15/19 when she presented to the ED with generalized fatigue for the last two weeks and was found to be in rapid atrial flutter. She was rate controlled and discharged. Patient states she was being treated for an URI with prednisone and abx prior to the onset of symptoms.  On follow up today, patient is s/p TEE/DCCV on 08/20/19. Unfortunately, she had reoccurrence of her atrial flutter on 08/26/19 and was cardioverted in the ER again. Her flecainide was increased at that time. She reports dramatic symptom improvement after both cardioversions. She denies any missed doses of anticoagulation.    Today, she denies symptoms of palpitations, chest pain, shortness of breath, orthopnea, PND, lower extremity edema, dizziness, presyncope, syncope, bleeding, or neurologic sequela. The patient is tolerating medications without difficulties and is otherwise without complaint today.    Atrial Fibrillation Risk Factors:  she does have symptoms or diagnosis of sleep apnea. she is not compliant with CPAP therapy. she does not have a history of rheumatic fever. she does not have a history of alcohol use. The patient does not have a history of early familial atrial fibrillation or other arrhythmias.  she has a BMI of Body mass index is 48.96 kg/m.Marland Kitchen Filed Weights   08/27/19 1336  Weight: 133.4 kg    Family History  Problem  Relation Age of Onset  . Colon cancer Mother   . Rectal cancer Mother   . Heart disease Other        several family members on dad's side have had heart issues - earliest in their 29's  . Stroke Paternal Grandfather   . Diabetes Paternal Grandfather   . Breast cancer Sister   . Colon polyps Sister        x 2  . Stroke Paternal Grandmother   . Diabetes Paternal Grandmother      Atrial Fibrillation Management history:  Previous antiarrhythmic drugs: flecainide  Previous cardioversions: 08/20/19, 08/26/19 Previous ablations: none CHADS2VASC score: 1 Anticoagulation history: none   Past Medical History:  Diagnosis Date  . Atrial tachycardia ([place])    a. Dx 03/2013.  . Chest pain    a. 03/2013: cath negative for CAD, no PE by CT angio.  Marland Kitchen Hyperglycemia    a. A1c 5.8 in 03/2013.  Marland Kitchen Hyperlipidemia    a. Monitored by PCP - elected to pursue lifestyle modification in 09/2012 with reassessment pending.  Marland Kitchen PAC (premature atrial contraction)    a. Dx 03/2013.  Marland Kitchen PAF (paroxysmal atrial fibrillation) ([place])    a. Dx 03/2013 - started on flecainide. CHADSVASC = 1 so no anticoag at present time.  . Paroxysmal atrial flutter ([place])    a. Dx 03/2013.  . Pulmonary nodule, right    a. 43mm RML by CT angio 03/2013.   Past Surgical History:  Procedure Laterality Date  . APPENDECTOMY    . CARDIOVERSION N/A 08/20/2019   Procedure: CARDIOVERSION;  Surgeon: Lelon Perla, MD;  Location: [facility]  ENDOSCOPY;  Service: Cardiovascular;  Laterality: N/A;  . CHOLECYSTECTOMY    . HERNIA REPAIR    . LEFT HEART CATHETERIZATION WITH CORONARY ANGIOGRAM N/A 03/28/2013   Procedure: LEFT HEART CATHETERIZATION WITH CORONARY ANGIOGRAM;  Surgeon: Ramond Dial, MD;  Location: [facility] CATH LAB;  Service: Cardiovascular;  Laterality: N/A;  . TEE WITHOUT CARDIOVERSION N/A 08/20/2019   Procedure: TRANSESOPHAGEAL ECHOCARDIOGRAM (TEE);  Surgeon: Lelon Perla, MD;  Location: [facility] ENDOSCOPY;  Service: Cardiovascular;   Laterality: N/A;    Current Outpatient Medications  Medication Sig Dispense Refill  . apixaban (ELIQUIS) 5 MG TABS tablet Take 1 tablet (5 mg total) by mouth 2 (two) times daily. 60 tablet 3  . ascorbic acid (VITAMIN C) 500 MG tablet Take 1,000 mg by mouth daily.    . flecainide (TAMBOCOR) 100 MG tablet Take 1 tablet (100 mg total) by mouth daily. 30 tablet 0  . loratadine (CLARITIN) 10 MG tablet Take 10 mg by mouth at bedtime.     . metoprolol succinate (TOPROL-XL) 25 MG 24 hr tablet TAKE 1 TABLET BY MOUTH DAILY. (Patient taking differently: Take 25 mg by mouth daily. ) 90 tablet 3  . metoprolol tartrate (LOPRESSOR) 50 MG tablet Take 1 tablet (50 mg total) by mouth daily as needed for up to 1 dose (consistent heart greater than 120 (longer than an hour at rest)). 60 tablet 0  . pantoprazole (PROTONIX) 40 MG tablet Take 40 mg by mouth daily.     No current facility-administered medications for this encounter.    No Known Allergies  Social History   Socioeconomic History  . Marital status: Married    Spouse name: Not on file  . Number of children: 2  . Years of education: Not on file  . Highest education level: Not on file  Occupational History    Employer: [employer]    Comment: Adminstrative assistance  Tobacco Use  . Smoking status: Never Smoker  . Smokeless tobacco: Never Used  Substance and Sexual Activity  . Alcohol use: Yes    Comment: rare - 2x/month  . Drug use: No  . Sexual activity: Yes  Other Topics Concern  . Not on file  Social History Narrative  . Not on file   Social Determinants of Health   Financial Resource Strain:   . Difficulty of Paying Living Expenses:   Food Insecurity:   . Worried About Charity fundraiser in the Last Year:   . Arboriculturist in the Last Year:   Transportation Needs:   . Film/video editor (Medical):   Marland Kitchen Lack of Transportation (Non-Medical):   Physical Activity:   . Days of Exercise per Week:   . Minutes of  Exercise per Session:   Stress:   . Feeling of Stress :   Social Connections:   . Frequency of Communication with Friends and Family:   . Frequency of Social Gatherings with Friends and Family:   . Attends Religious Services:   . Active Member of Clubs or Organizations:   . Attends Archivist Meetings:   Marland Kitchen Marital Status:   Intimate Partner Violence:   . Fear of Current or Ex-Partner:   . Emotionally Abused:   Marland Kitchen Physically Abused:   . Sexually Abused:      ROS- All systems are reviewed and negative except as per the HPI above.  Physical Exam: Vitals:   08/27/19 1336  BP: 122/78  Pulse: 86  Weight: 133.4 kg  Height: 5\' 5"  (1.651 m)    GEN- The patient is well appearing obese female, alert and oriented x 3 today.   HEENT-head normocephalic, atraumatic, sclera clear, conjunctiva pink, hearing intact, trachea midline. Lungs- Clear to ausculation bilaterally, normal work of breathing Heart- Regular rate and rhythm, no murmurs, rubs or gallops  GI- soft, NT, ND, + BS Extremities- no clubbing, cyanosis, or edema MS- no significant deformity or atrophy Skin- no rash or lesion Psych- euthymic mood, full affect Neuro- strength and sensation are intact   Wt Readings from Last 3 Encounters:  08/27/19 133.4 kg  08/26/19 127 kg  08/20/19 127 kg    EKG today demonstrates SR HR 86, PR 156, QRS 84, QTc 457  Echo 03/29/19 demonstrated  1. Left ventricular ejection fraction, by visual estimation, is 60 to  65%. The left ventricle has normal function. There is no left ventricular  hypertrophy.  2. The left ventricle has no regional wall motion abnormalities.  3. Global right ventricle has normal systolic function.The right  ventricular size is normal. No increase in right ventricular wall  thickness.  4. Left atrial size was normal.  5. Right atrial size was normal.  6. The mitral valve is normal in structure. Trivial mitral valve  regurgitation. No evidence of  mitral stenosis.  7. The tricuspid valve is normal in structure. Tricuspid valve  regurgitation is not demonstrated.  8. The aortic valve was not well visualized. Aortic valve regurgitation  is not visualized. Mild to moderate aortic valve sclerosis/calcification  without any evidence of aortic stenosis.  9. The pulmonic valve was normal in structure. Pulmonic valve  regurgitation is not visualized.  10. Normal pulmonary artery systolic pressure.  11. The inferior vena cava is normal in size with greater than 50%  respiratory variability, suggesting right atrial pressure of 3 mmHg.   Epic records are reviewed at length today  CHA2DS2-VASc Score = 1  The patient's score is based upon: CHF History: 0 HTN History: 0 Age : 0 Diabetes History: 0 Stroke History: 0 Vascular Disease History: 0 Gender: 1      ASSESSMENT AND PLAN: 1. Paroxysmal Atrial Fibrillation/atrial flutter/atrial tachycardia The patient's CHA2DS2-VASc score is 1, indicating a 0.6% annual risk of stroke.   S/p TEE/DCCV 08/20/19 with repeat DCCV on 08/27/19. Patient has not yet started higher dose of flecainide.  Will start flecainide 100 mg BID. Return for ECG in one week. Continue Eliquis 5 mg BID for at least 4 weeks post DCCV with no missed doses. Continue Toprol 25 mg daily. Patient to start Lopressor 50 mg q 6 hours for heart rate >100.  Lifestyle modification as below.  2. Obesity Body mass index is 48.96 kg/m. Lifestyle modification was discussed and encouraged including regular physical activity and weight reduction.  3. Obstructive sleep apnea Patient having symptoms of daytime somnolence, severe snoring, and witnessed apnea. Will refer for sleep study.   Follow up in one week for ECG.   [facility] [address] [place], [place] [address] [phone] 08/27/2019 2:00 PM

## 2019-08-27 NOTE — ED Provider Notes (Signed)
   Chadsvasc2 score of 1   Wyvonnia Dusky, MD 08/27/19 1040

## 2019-08-27 NOTE — Patient Instructions (Signed)
Increase flecainide to 100mg twice a day 

## 2019-08-29 ENCOUNTER — Telehealth: Payer: Self-pay | Admitting: Medical

## 2019-08-29 NOTE — Telephone Encounter (Signed)
   Patient is concerned that she is back in atrial fibrillation. Patient reports feeling generally poor today. Around 5:30pm she felt like her heart "went out of rhythm" again. Recently underwent a cardioversion for atrial flutter. HR has been labile this evening from the 70s up to 140s and briefly 160s. She has some SOB and dizziness which is c/w prior atrial flutter episodes. BP is 139/100 at the time of this call. No complaints of chest pain, pre-syncope, or syncope. She is scheduled to see Adline Peals, PA-C 09/03/19 for close outpatient follow-up. She has a prescription for metoprolol tartrate 50mg  which she was previously instructed to use as needed for HR persistently elevated above 120 bpm. I encouraged her to utilize this medication for the time being. We reviewed ED precautions. Will route this message to [facility] to review - possible she can be seen in the Afib clinic tomorrow. Patient instructed to call the Afib clinic in the morning if she does not hear from them.   Abigail Butts, PA-C 08/29/19; 7:11 PM

## 2019-08-30 NOTE — Telephone Encounter (Signed)
Called to check on patient states she went back into normal rhythm around 10pm last night. She did not take the PRN metoprolol as directed just sat quietly and it finally converted. Reassured pt ok to use PRN metoprolol for breakthrough and confirmed pt is taking flecainide 100mg  BID - she will use PRN metoprolol over weekend if needed. Pt appreciative of call back.

## 2019-09-03 ENCOUNTER — Ambulatory Visit (HOSPITAL_COMMUNITY)
Admission: RE | Admit: 2019-09-03 | Discharge: 2019-09-03 | Disposition: A | Discharge status: Home / Self Care | Payer: Medicaid Other | Source: Ambulatory Visit | Attending: Physician Assistant | Admitting: Physician Assistant

## 2019-09-03 ENCOUNTER — Other Ambulatory Visit: Payer: Self-pay

## 2019-09-03 VITALS — BP 108/72 | HR 79

## 2019-09-03 DIAGNOSIS — R5383 Other fatigue: Secondary | ICD-10-CM | POA: Insufficient documentation

## 2019-09-03 DIAGNOSIS — I4891 Unspecified atrial fibrillation: Secondary | ICD-10-CM | POA: Diagnosis not present

## 2019-09-03 DIAGNOSIS — I480 Paroxysmal atrial fibrillation: Secondary | ICD-10-CM

## 2019-09-03 NOTE — Progress Notes (Signed)
Patient returns for ECG after increasing flecainide dose. ECG shows SR HR 79, PR 178, QRS 100, QTc 458. Patient does feel some fatigue but this is improving. Will continue anticoagulation for now. F/u in the AF clinic in 3 months.

## 2019-09-27 ENCOUNTER — Other Ambulatory Visit (HOSPITAL_COMMUNITY)
Admission: RE | Admit: 2019-09-27 | Discharge: 2019-09-27 | Disposition: A | Discharge status: Home / Self Care | Payer: Medicaid Other | Source: Ambulatory Visit | Attending: Cardiovascular Disease | Admitting: Cardiovascular Disease

## 2019-09-27 DIAGNOSIS — Z20822 Contact with and (suspected) exposure to covid-19: Secondary | ICD-10-CM | POA: Insufficient documentation

## 2019-09-27 DIAGNOSIS — Z01812 Encounter for preprocedural laboratory examination: Secondary | ICD-10-CM | POA: Insufficient documentation

## 2019-09-27 LAB — SARS CORONAVIRUS 2 (TAT 6-24 HRS): SARS Coronavirus 2: NEGATIVE

## 2019-09-29 ENCOUNTER — Ambulatory Visit (HOSPITAL_BASED_OUTPATIENT_CLINIC_OR_DEPARTMENT_OTHER): Payer: Medicaid Other | Attending: Physician Assistant | Admitting: Cardiology

## 2019-09-29 ENCOUNTER — Other Ambulatory Visit: Payer: Self-pay

## 2019-09-29 DIAGNOSIS — R0681 Apnea, not elsewhere classified: Secondary | ICD-10-CM | POA: Diagnosis not present

## 2019-09-30 ENCOUNTER — Telehealth: Payer: Self-pay | Admitting: *Deleted

## 2019-09-30 NOTE — Telephone Encounter (Signed)
Staff message sent to Gae Bon ok to schedule sleep study. No insurance to [employer]. Medicaid pending.

## 2019-09-30 NOTE — Telephone Encounter (Signed)
Informed patient of sleep study results and patient understanding was verbalized. Patient understands her sleep study showed no significant sleep apnea.   Pt is aware and agreeable to normal results.  

## 2019-09-30 NOTE — Telephone Encounter (Signed)
-----   Message from Sandra Margarita, MD sent at 09/30/2019  1:29 PM EDT ----- Please let patient know that sleep study showed no significant sleep apnea.

## 2019-09-30 NOTE — Procedures (Signed)
  Patient Name: Sandra Stark, Antwi Date: 09/29/2019 Gender: Female D.O.B: February 18, 1966 Age (years): 54 Referring Provider: Malka So PA Height (inches): 65 Interpreting Physician: Fransico Him MD, ABSM Weight (lbs): 275 RPSGT: Earney Hamburg BMI: 46 MRN: [id number] Neck Size: 16.50  CLINICAL INFORMATION Sleep Study Type: NPSG  Indication for sleep study: N/A  Epworth Sleepiness Score: 12  SLEEP STUDY TECHNIQUE As per the AASM Manual for the Scoring of Sleep and Associated Events v2.3 (April 2016) with a hypopnea requiring 4% desaturations.  The channels recorded and monitored were frontal, central and occipital EEG, electrooculogram (EOG), submentalis EMG (chin), nasal and oral airflow, thoracic and abdominal wall motion, anterior tibialis EMG, snore microphone, electrocardiogram, and pulse oximetry.  MEDICATIONS Medications self-administered by patient taken the night of the study : ELIQUIS, B COMPLEX W/B-12 VITAMIN C & LIVER, FLECAINIDE ACETATE  SLEEP ARCHITECTURE The study was initiated at 11:04:16 PM and ended at 5:19:19 AM.  Sleep onset time was 39.4 minutes and the sleep efficiency was 74.2%. The total sleep time was 278.2 minutes.  Stage REM latency was 89.0 minutes.  The patient spent 1.8% of the night in stage N1 sleep, 71.6% in stage N2 sleep, 1.3% in stage N3 and 25.3% in REM.  Alpha intrusion was absent.  Supine sleep was 12.83%.  RESPIRATORY PARAMETERS The overall apnea/hypopnea index (AHI) was 1.1 per hour. There were 1 total apneas, including 0 obstructive, 1 central and 0 mixed apneas. There were 4 hypopneas and 11 RERAs.  The AHI during Stage REM sleep was 0.9 per hour.  AHI while supine was 6.7 per hour.  The mean oxygen saturation was 93.4%. The minimum SpO2 during sleep was 86.0%.  loud snoring was noted during this study.  CARDIAC DATA The 2 lead EKG demonstrated sinus rhythm. The mean heart rate was 68.6 beats per minute. Other EKG  findings include: None.  LEG MOVEMENT DATA The total PLMS were 0 with a resulting PLMS index of 0.0. Associated arousal with leg movement index was 1.1 .  IMPRESSIONS - No significant obstructive sleep apnea occurred during this study (AHI = 1.1/h). - No significant central sleep apnea occurred during this study (CAI = 0.2/h). - Mild oxygen desaturation was noted during this study (Min O2 = 86.0%). - The patient snored with loud snoring volume. - No cardiac abnormalities were noted during this study. - Clinically significant periodic limb movements did not occur during sleep. No significant associated arousals.  DIAGNOSIS - Normal Study  RECOMMENDATIONS - Avoid alcohol, sedatives and other CNS depressants that may worsen sleep apnea and disrupt normal sleep architecture. - Sleep hygiene should be reviewed to assess factors that may improve sleep quality. - Weight management and regular exercise should be initiated or continued if appropriate.  [Electronically signed] 09/30/2019 01:27 PM  Fransico Him MD, ABSM Diplomate, American Board of Sleep Medicine

## 2019-10-04 ENCOUNTER — Telehealth: Payer: Self-pay | Admitting: *Deleted

## 2019-10-04 NOTE — Telephone Encounter (Signed)
Sleep study cancelled:  IMPRESSIONS - No significant obstructive sleep apnea occurred during this study (AHI = 1.1/h). - No significant central sleep apnea occurred during this study (CAI = 0.2/h). - Mild oxygen desaturation was noted during this study (Min O2 = 86.0%). - The patient snored with loud snoring volume. - No cardiac abnormalities were noted during this study. - Clinically significant periodic limb movements did not occur during sleep. No significant associated arousals.  DIAGNOSIS - Normal Study

## 2019-10-04 NOTE — Telephone Encounter (Signed)
-----   Message from Lauralee Evener, [place] sent at 09/30/2019 10:54 AM EDT ----- Regarding: RE: sleep study Ok to schedule split night sleep study.  ----- Message ----- From: Juluis Mire, RN Sent: 08/27/2019   1:58 PM EDT To: [facility] Subject: sleep study                                    Pt needs sleep study for afib, snoring, witnessed apnea per ricky fenton Thanks Stacy

## 2019-12-03 ENCOUNTER — Inpatient Hospital Stay (HOSPITAL_COMMUNITY): Admission: RE | Admit: 2019-12-03 | Payer: Self-pay | Source: Ambulatory Visit | Admitting: Physician Assistant

## 2019-12-11 ENCOUNTER — Encounter (HOSPITAL_COMMUNITY): Payer: Self-pay | Admitting: Physician Assistant

## 2019-12-11 ENCOUNTER — Ambulatory Visit (HOSPITAL_COMMUNITY)
Admission: RE | Admit: 2019-12-11 | Discharge: 2019-12-11 | Disposition: A | Discharge status: Home / Self Care | Payer: Medicaid Other | Source: Ambulatory Visit | Attending: Physician Assistant | Admitting: Physician Assistant

## 2019-12-11 ENCOUNTER — Other Ambulatory Visit: Payer: Self-pay

## 2019-12-11 DIAGNOSIS — R200 Anesthesia of skin: Secondary | ICD-10-CM | POA: Diagnosis not present

## 2019-12-11 DIAGNOSIS — E785 Hyperlipidemia, unspecified: Secondary | ICD-10-CM | POA: Diagnosis not present

## 2019-12-11 DIAGNOSIS — I4892 Unspecified atrial flutter: Secondary | ICD-10-CM | POA: Diagnosis not present

## 2019-12-11 DIAGNOSIS — I480 Paroxysmal atrial fibrillation: Secondary | ICD-10-CM | POA: Insufficient documentation

## 2019-12-11 DIAGNOSIS — R531 Weakness: Secondary | ICD-10-CM | POA: Insufficient documentation

## 2019-12-11 DIAGNOSIS — Z79899 Other long term (current) drug therapy: Secondary | ICD-10-CM | POA: Insufficient documentation

## 2019-12-11 DIAGNOSIS — I491 Atrial premature depolarization: Secondary | ICD-10-CM | POA: Insufficient documentation

## 2019-12-11 DIAGNOSIS — Z6841 Body Mass Index (BMI) 40.0 and over, adult: Secondary | ICD-10-CM | POA: Insufficient documentation

## 2019-12-11 DIAGNOSIS — I471 Supraventricular tachycardia: Secondary | ICD-10-CM | POA: Insufficient documentation

## 2019-12-11 DIAGNOSIS — Z9049 Acquired absence of other specified parts of digestive tract: Secondary | ICD-10-CM | POA: Insufficient documentation

## 2019-12-11 DIAGNOSIS — R911 Solitary pulmonary nodule: Secondary | ICD-10-CM | POA: Diagnosis not present

## 2019-12-11 DIAGNOSIS — E669 Obesity, unspecified: Secondary | ICD-10-CM | POA: Diagnosis not present

## 2019-12-11 DIAGNOSIS — Z9861 Coronary angioplasty status: Secondary | ICD-10-CM | POA: Diagnosis not present

## 2019-12-11 DIAGNOSIS — Z8249 Family history of ischemic heart disease and other diseases of the circulatory system: Secondary | ICD-10-CM | POA: Insufficient documentation

## 2019-12-11 MED ORDER — FLECAINIDE ACETATE 100 MG PO TABS: 100.0000 mg | ORAL_TABLET | Freq: Two times a day (BID) | ORAL | 2 refills | Status: DC

## 2019-12-11 MED ORDER — METOPROLOL SUCCINATE ER 25 MG PO TB24: ORAL_TABLET | ORAL | 3 refills | Status: DC

## 2019-12-11 NOTE — Addendum Note (Signed)
Encounter addended by: Oliver Barre, PA on: 12/11/2019 10:49 AM  Actions taken: Clinical Note Signed

## 2019-12-11 NOTE — Patient Instructions (Signed)
Stop Eliquis

## 2019-12-11 NOTE — Progress Notes (Addendum)
Primary Care Physician: Raina Mina., MD Primary Cardiologist: Dr Aundra Dubin Primary Electrophysiologist: Dr Curt Bears Referring Physician: Dr Curt Bears   Sandra Stark is a 54 y.o. female with a history of paroxysmal atrial fibrillation, atrial flutter, atrial tachycardia, HLD who presents for follow up in the [facility].  The patient was initially diagnosed with atrial arrhythmias in 2014. She has been maintained on flecainide and metoprolol. Patient is not on anticoagulation for a CHADS2VASC score of 1. She was in her usual state of health until 08/15/19 when she presented to the ED with generalized fatigue for the last two weeks and was found to be in rapid atrial flutter. She was rate controlled and discharged. Patient states she was being treated for an URI with prednisone and abx prior to the onset of symptoms. Patient is s/p TEE/DCCV on 08/20/19. Unfortunately, she had reoccurrence of her atrial flutter on 08/26/19 and was cardioverted in the ER again.   On follow up today, patient reports she has done well from a cardiac standpoint. She did have one episode of heart racing which resolved with PRN BB. She had a sleep study which showed no OSA. She does report right leg numbness, tingling, and weakness on standing for prolonged periods of time.   Today, she denies symptoms of palpitations, chest pain, shortness of breath, orthopnea, PND, lower extremity edema, dizziness, presyncope, syncope, bleeding. The patient is tolerating medications without difficulties and is otherwise without complaint today.    Atrial Fibrillation Risk Factors:  she does not have symptoms or diagnosis of sleep apnea. Negative sleep study 09/29/19. she does not have a history of rheumatic fever. she does not have a history of alcohol use. The patient does not have a history of early familial atrial fibrillation or other arrhythmias.  she has a BMI of Body mass index is 47.99 kg/m.Marland Kitchen Filed  Weights   12/11/19 1007  Weight: 130.8 kg    Family History  Problem Relation Age of Onset   Colon cancer Mother    Rectal cancer Mother    Heart disease Other        several family members on dad's side have had heart issues - earliest in their 69's   Stroke Paternal Grandfather    Diabetes Paternal Grandfather    Breast cancer Sister    Colon polyps Sister        x 2   Stroke Paternal Grandmother    Diabetes Paternal Grandmother      Atrial Fibrillation Management history:  Previous antiarrhythmic drugs: flecainide  Previous cardioversions: 08/20/19, 08/26/19 Previous ablations: none CHADS2VASC score: 1 Anticoagulation history: none   Past Medical History:  Diagnosis Date   Atrial tachycardia ([place])    a. Dx 03/2013.   Chest pain    a. 03/2013: cath negative for CAD, no PE by CT angio.   Hyperglycemia    a. A1c 5.8 in 03/2013.   Hyperlipidemia    a. Monitored by PCP - elected to pursue lifestyle modification in 09/2012 with reassessment pending.   PAC (premature atrial contraction)    a. Dx 03/2013.   PAF (paroxysmal atrial fibrillation) ([place])    a. Dx 03/2013 - started on flecainide. CHADSVASC = 1 so no anticoag at present time.   Paroxysmal atrial flutter ([place])    a. Dx 03/2013.   Pulmonary nodule, right    a. 20mm RML by CT angio 03/2013.   Past Surgical History:  Procedure Laterality Date   APPENDECTOMY  CARDIOVERSION N/A 08/20/2019   Procedure: CARDIOVERSION;  Surgeon: Lelon Perla, MD;  Location: [facility] ENDOSCOPY;  Service: Cardiovascular;  Laterality: N/A;   CHOLECYSTECTOMY     HERNIA REPAIR     LEFT HEART CATHETERIZATION WITH CORONARY ANGIOGRAM N/A 03/28/2013   Procedure: LEFT HEART CATHETERIZATION WITH CORONARY ANGIOGRAM;  Surgeon: Ramond Dial, MD;  Location: [facility] CATH LAB;  Service: Cardiovascular;  Laterality: N/A;   TEE WITHOUT CARDIOVERSION N/A 08/20/2019   Procedure: TRANSESOPHAGEAL ECHOCARDIOGRAM (TEE);  Surgeon:  Lelon Perla, MD;  Location: [facility] ENDOSCOPY;  Service: Cardiovascular;  Laterality: N/A;    Current Outpatient Medications  Medication Sig Dispense Refill   ascorbic acid (VITAMIN C) 500 MG tablet Take 1,000 mg by mouth daily.     flecainide (TAMBOCOR) 100 MG tablet Take 1 tablet (100 mg total) by mouth 2 (two) times daily. 180 tablet 2   loratadine (CLARITIN) 10 MG tablet Take 10 mg by mouth at bedtime.      metoprolol succinate (TOPROL-XL) 25 MG 24 hr tablet TAKE 1 TABLET BY MOUTH DAILY. 90 tablet 3   metoprolol tartrate (LOPRESSOR) 50 MG tablet Take 1 tablet (50 mg total) by mouth daily as needed for up to 1 dose (consistent heart greater than 120 (longer than an hour at rest)). 60 tablet 0   pantoprazole (PROTONIX) 40 MG tablet Take 40 mg by mouth daily.     No current facility-administered medications for this encounter.    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  Tobacco Use   Smoking status: Never Smoker   Smokeless tobacco: Never Used  Substance and Sexual Activity   Alcohol use: Yes    Comment: rare - 2x/month   Drug use: No   Sexual activity: Yes  Other Topics Concern   Not on file  Social History Narrative   Not on file   Social Determinants of Health   Financial Resource Strain:    Difficulty of Paying Living Expenses: Not on file  Food Insecurity:    Worried About Charity fundraiser in the Last Year: Not on file   [employer] of Food in the Last Year: Not on file  Transportation Needs:    Lack of Transportation (Medical): Not on file   Lack of Transportation (Non-Medical): Not on file  Physical Activity:    Days of Exercise per Week: Not on file   Minutes of Exercise  per Session: Not on file  Stress:    Feeling of Stress : Not on file  Social Connections:    Frequency of Communication with Friends and Family: Not on file   Frequency of Social Gatherings with Friends and Family: Not on file   Attends Religious Services: Not on file   Active Member of Clubs or Organizations: Not on file   Attends Archivist Meetings: Not on file   Marital Status: Not on file  Intimate Partner Violence:    Fear of Current or Ex-Partner: Not on file   Emotionally Abused: Not on file   Physically Abused: Not on file   Sexually Abused: Not on file     ROS- All systems are reviewed and negative except as  per the HPI above.  Physical Exam: Vitals:   12/11/19 1007  BP: 122/78  Pulse: 78  Weight: 130.8 kg  Height: 5\' 5"  (1.651 m)    GEN- The patient is well appearing obese female, alert and oriented x 3 today.   HEENT-head normocephalic, atraumatic, sclera clear, conjunctiva pink, hearing intact, trachea midline. Lungs- Clear to ausculation bilaterally, normal work of breathing Heart- Regular rate and rhythm, no murmurs, rubs or gallops  GI- soft, NT, ND, + BS Extremities- no clubbing, cyanosis. Non pitting edema MS- no significant deformity or atrophy Skin- no rash or lesion Psych- euthymic mood, full affect Neuro- strength and sensation are intact   Wt Readings from Last 3 Encounters:  12/11/19 130.8 kg  09/29/19 124.7 kg  08/27/19 133.4 kg    EKG today demonstrates SR HR 78, PR 188, QRS 98, QTc 485  Echo 03/29/19 demonstrated  1. Left ventricular ejection fraction, by visual estimation, is 60 to  65%. The left ventricle has normal function. There is no left ventricular  hypertrophy.  2. The left ventricle has no regional wall motion abnormalities.  3. Global right ventricle has normal systolic function.The right  ventricular size is normal. No increase in right ventricular wall  thickness.  4. Left atrial size was normal.   5. Right atrial size was normal.  6. The mitral valve is normal in structure. Trivial mitral valve  regurgitation. No evidence of mitral stenosis.  7. The tricuspid valve is normal in structure. Tricuspid valve  regurgitation is not demonstrated.  8. The aortic valve was not well visualized. Aortic valve regurgitation  is not visualized. Mild to moderate aortic valve sclerosis/calcification  without any evidence of aortic stenosis.  9. The pulmonic valve was normal in structure. Pulmonic valve  regurgitation is not visualized.  10. Normal pulmonary artery systolic pressure.  11. The inferior vena cava is normal in size with greater than 50%  respiratory variability, suggesting right atrial pressure of 3 mmHg.   Epic records are reviewed at length today  CHA2DS2-VASc Score = 1  The patient's score is based upon: CHF History: 0 HTN History: 0 Age : 0 Diabetes History: 0 Stroke History: 0 Vascular Disease History: 0 Gender: 1      ASSESSMENT AND PLAN: 1. Paroxysmal Atrial Fibrillation/atrial flutter/atrial tachycardia The patient's CHA2DS2-VASc score is 1, indicating a 0.6% annual risk of stroke.   S/p TEE/DCCV 08/20/19 with repeat DCCV on 08/26/19. Continue flecainide 100 mg BID.  Given her low CHADS2VASC score, will plan to stop Eliquis today.  Continue Toprol 25 mg daily. Continue Lopressor 50 mg q 6 hours for heart rate >100.   2. Obesity Body mass index is 47.99 kg/m. Lifestyle modification was discussed and encouraged including regular physical activity and weight reduction.  3. Right leg numbness/weakness Symptoms occur on standing for long periods of time. Encouraged her to follow up with PCP for evaluation.    Follow up in the AF clinic in 3 months.    [facility] [address] [place], [place] [address] [phone] 12/11/2019 10:45 AM

## 2020-03-10 ENCOUNTER — Other Ambulatory Visit: Payer: Self-pay

## 2020-03-10 ENCOUNTER — Ambulatory Visit (HOSPITAL_COMMUNITY)
Admission: RE | Admit: 2020-03-10 | Discharge: 2020-03-10 | Disposition: A | Discharge status: Home / Self Care | Payer: Medicaid Other | Source: Ambulatory Visit | Attending: Physician Assistant | Admitting: Physician Assistant

## 2020-03-10 VITALS — BP 138/96 | HR 114 | Ht 65.0 in | Wt 306.4 lb

## 2020-03-10 DIAGNOSIS — E785 Hyperlipidemia, unspecified: Secondary | ICD-10-CM | POA: Diagnosis not present

## 2020-03-10 DIAGNOSIS — I471 Supraventricular tachycardia: Secondary | ICD-10-CM | POA: Insufficient documentation

## 2020-03-10 DIAGNOSIS — I4892 Unspecified atrial flutter: Secondary | ICD-10-CM | POA: Diagnosis not present

## 2020-03-10 DIAGNOSIS — R5383 Other fatigue: Secondary | ICD-10-CM | POA: Insufficient documentation

## 2020-03-10 DIAGNOSIS — I480 Paroxysmal atrial fibrillation: Secondary | ICD-10-CM | POA: Diagnosis not present

## 2020-03-10 DIAGNOSIS — I484 Atypical atrial flutter: Secondary | ICD-10-CM

## 2020-03-10 DIAGNOSIS — Z7901 Long term (current) use of anticoagulants: Secondary | ICD-10-CM | POA: Diagnosis not present

## 2020-03-10 DIAGNOSIS — R0602 Shortness of breath: Secondary | ICD-10-CM | POA: Diagnosis present

## 2020-03-10 DIAGNOSIS — E669 Obesity, unspecified: Secondary | ICD-10-CM | POA: Diagnosis not present

## 2020-03-10 DIAGNOSIS — Z6841 Body Mass Index (BMI) 40.0 and over, adult: Secondary | ICD-10-CM | POA: Diagnosis not present

## 2020-03-10 DIAGNOSIS — Z79899 Other long term (current) drug therapy: Secondary | ICD-10-CM | POA: Diagnosis not present

## 2020-03-10 HISTORY — DX: Atypical atrial flutter: I48.4

## 2020-03-10 MED ORDER — APIXABAN 5 MG PO TABS
5.0000 mg | ORAL_TABLET | Freq: Two times a day (BID) | ORAL | 3 refills | Status: DC
Start: 2020-03-10 — End: 2020-07-06

## 2020-03-10 MED ORDER — METOPROLOL SUCCINATE ER 25 MG PO TB24: 50.0000 mg | ORAL_TABLET | Freq: Every day | ORAL | 3 refills | Status: DC

## 2020-03-10 MED ORDER — METOPROLOL SUCCINATE ER 25 MG PO TB24: 25.0000 mg | ORAL_TABLET | Freq: Two times a day (BID) | ORAL | 6 refills | Status: DC

## 2020-03-10 NOTE — Patient Instructions (Signed)
Restart Eliquis 5mg  twice a day  Increase metoprolol to 50mg  once a day

## 2020-03-10 NOTE — Progress Notes (Signed)
Primary Care Physician: Raina Mina., MD Primary Cardiologist: Dr Aundra Dubin (remotely) Primary Electrophysiologist: Dr Curt Bears Referring Physician: Dr Alcide Clever is a 54 y.o. female with a history of paroxysmal atrial fibrillation, atrial flutter, atrial tachycardia, HLD who presents for follow up in the [facility].  The patient was initially diagnosed with atrial arrhythmias in 2014. She has been maintained on flecainide and metoprolol. Patient is not on anticoagulation for a CHADS2VASC score of 1. She was in her usual state of health until 08/15/19 when she presented to the ED with generalized fatigue for the last two weeks and was found to be in rapid atrial flutter. She was rate controlled and discharged. Patient states she was being treated for an URI with prednisone and abx prior to the onset of symptoms. Patient is s/p TEE/DCCV on 08/20/19. Unfortunately, she had reoccurrence of her atrial flutter on 08/26/19 and was cardioverted in the ER again.   On follow up today, patient reports that for the past week she has had elevated heart rates with increased SOB and fatigue. She has been taking her PRN metoprolol. She is back in atrial flutter today. There were no specific triggers that she could identify.   Today, she denies symptoms of chest pain, orthopnea, PND, lower extremity edema, dizziness, presyncope, syncope, bleeding. The patient is tolerating medications without difficulties and is otherwise without complaint today.    Atrial Fibrillation Risk Factors:  she does not have symptoms or diagnosis of sleep apnea. Negative sleep study 09/29/19. she does not have a history of rheumatic fever. she does not have a history of alcohol use. The patient does not have a history of early familial atrial fibrillation or other arrhythmias.  she has a BMI of Body mass index is 50.99 kg/m.Marland Kitchen Filed Weights   03/10/20 0956  Weight: (!) 139 kg    Family  History  Problem Relation Age of Onset   Colon cancer Mother    Rectal cancer Mother    Heart disease Other        several family members on dad's side have had heart issues - earliest in their 92's   Stroke Paternal Grandfather    Diabetes Paternal Grandfather    Breast cancer Sister    Colon polyps Sister        x 2   Stroke Paternal Grandmother    Diabetes Paternal Grandmother      Atrial Fibrillation Management history:  Previous antiarrhythmic drugs: flecainide  Previous cardioversions: 08/20/19, 08/26/19 Previous ablations: none CHADS2VASC score: 1 Anticoagulation history: Eliquis   Past Medical History:  Diagnosis Date   Atrial tachycardia ([place])    a. Dx 03/2013.   Chest pain    a. 03/2013: cath negative for CAD, no PE by CT angio.   Hyperglycemia    a. A1c 5.8 in 03/2013.   Hyperlipidemia    a. Monitored by PCP - elected to pursue lifestyle modification in 09/2012 with reassessment pending.   PAC (premature atrial contraction)    a. Dx 03/2013.   PAF (paroxysmal atrial fibrillation) ([place])    a. Dx 03/2013 - started on flecainide. CHADSVASC = 1 so no anticoag at present time.   Paroxysmal atrial flutter ([place])    a. Dx 03/2013.   Pulmonary nodule, right    a. 97mm RML by CT angio 03/2013.   Past Surgical History:  Procedure Laterality Date   APPENDECTOMY     CARDIOVERSION N/A 08/20/2019  Procedure: CARDIOVERSION;  Surgeon: Lelon Perla, MD;  Location: [facility] ENDOSCOPY;  Service: Cardiovascular;  Laterality: N/A;   CHOLECYSTECTOMY     HERNIA REPAIR     LEFT HEART CATHETERIZATION WITH CORONARY ANGIOGRAM N/A 03/28/2013   Procedure: LEFT HEART CATHETERIZATION WITH CORONARY ANGIOGRAM;  Surgeon: Ramond Dial, MD;  Location: [facility] CATH LAB;  Service: Cardiovascular;  Laterality: N/A;   TEE WITHOUT CARDIOVERSION N/A 08/20/2019   Procedure: TRANSESOPHAGEAL ECHOCARDIOGRAM (TEE);  Surgeon: Lelon Perla, MD;  Location: [facility] ENDOSCOPY;  Service:  Cardiovascular;  Laterality: N/A;    Current Outpatient Medications  Medication Sig Dispense Refill   ascorbic acid (VITAMIN C) 500 MG tablet Take 1,000 mg by mouth daily.     flecainide (TAMBOCOR) 100 MG tablet Take 1 tablet (100 mg total) by mouth 2 (two) times daily. 180 tablet 2   loratadine (CLARITIN) 10 MG tablet Take 10 mg by mouth at bedtime.      metoprolol succinate (TOPROL-XL) 25 MG 24 hr tablet TAKE 1 TABLET BY MOUTH DAILY. 90 tablet 3   metoprolol tartrate (LOPRESSOR) 50 MG tablet Take 1 tablet (50 mg total) by mouth daily as needed for up to 1 dose (consistent heart greater than 120 (longer than an hour at rest)). 60 tablet 0   pantoprazole (PROTONIX) 40 MG tablet Take 40 mg by mouth daily.     No current facility-administered medications for this encounter.    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  Tobacco Use   Smoking status: Never Smoker   Smokeless tobacco: Never Used  Substance and Sexual Activity   Alcohol use: Yes    Comment: rare - 2x/month   Drug use: No   Sexual activity: Yes  Other Topics Concern   Not on file  Social History Narrative   Not on file   Social Determinants of Health   Financial Resource Strain:    Difficulty of Paying Living Expenses: Not on file  Food Insecurity:    Worried About Charity fundraiser in the Last Year: Not on file   [employer] of Food in the Last Year: Not on file  Transportation Needs:    Lack of Transportation (Medical): Not on file   Lack of Transportation (Non-Medical): Not on file  Physical Activity:    Days of Exercise per Week: Not on file   Minutes of Exercise per Session: Not on file  Stress:    Feeling of Stress :  Not on file  Social Connections:    Frequency of Communication with Friends and Family: Not on file   Frequency of Social Gatherings with Friends and Family: Not on file   Attends Religious Services: Not on file   Active Member of Clubs or Organizations: Not on file   Attends Archivist Meetings: Not on file   Marital Status: Not on file  Intimate Partner Violence:    Fear of Current or Ex-Partner: Not on file   Emotionally Abused: Not on file   Physically Abused: Not on file   Sexually Abused: Not on file     ROS- All systems are reviewed and negative except as per the HPI above.  Physical Exam: Vitals:   03/10/20 0956  BP: (!) 138/96  Pulse: (!) 114  Weight: (!) 139 kg  Height: 5\' 5"  (1.651 m)    GEN- The patient is well appearing obese female, alert and oriented x 3 today.   HEENT-head normocephalic, atraumatic, sclera clear, conjunctiva pink, hearing intact, trachea midline. Lungs- Clear to ausculation bilaterally, normal work of breathing Heart- irregular rate and rhythm, no murmurs, rubs or gallops  GI- soft, NT, ND, + BS Extremities- no clubbing, cyanosis, or edema MS- no significant deformity or atrophy Skin- no rash or lesion Psych- euthymic mood, full affect Neuro- strength and sensation are intact   Wt Readings from Last 3 Encounters:  03/10/20 (!) 139 kg  12/11/19 130.8 kg  09/29/19 124.7 kg    EKG today demonstrates atypical atrial flutter with variable block HR 114, QRS 94, QTc 438  Echo 03/29/19 demonstrated  1. Left ventricular ejection fraction, by visual estimation, is 60 to  65%. The left ventricle has normal function. There is no left ventricular  hypertrophy.  2. The left ventricle has no regional wall motion abnormalities.  3. Global right ventricle has normal systolic function.The right  ventricular size is normal. No increase in right ventricular wall  thickness.  4. Left atrial size was normal.  5. Right atrial  size was normal.  6. The mitral valve is normal in structure. Trivial mitral valve  regurgitation. No evidence of mitral stenosis.  7. The tricuspid valve is normal in structure. Tricuspid valve  regurgitation is not demonstrated.  8. The aortic valve was not well visualized. Aortic valve regurgitation  is not visualized. Mild to moderate aortic valve sclerosis/calcification  without any evidence of aortic stenosis.  9. The pulmonic valve was normal in structure. Pulmonic valve  regurgitation is not visualized.  10. Normal pulmonary artery systolic pressure.  11. The inferior vena cava is normal in size with greater than 50%  respiratory variability, suggesting right atrial pressure of 3 mmHg.   Epic records are reviewed at length today  CHA2DS2-VASc Score = 1  The patient's score is based upon: CHF History: 0 HTN History: 0 Diabetes History: 0 Stroke History: 0 Vascular Disease History: 0      ASSESSMENT AND PLAN: 1. Paroxysmal Atrial Fibrillation/atrial flutter/atrial tachycardia The patient's CHA2DS2-VASc score is 1, indicating a 0.6% annual risk of stroke.   Patient in rapid flutter today.  Increase Toprol to 50 mg daily.  Resume Eliquis 5 mg BID. Continue flecainide 100 mg BID for now. Will plan on increasing to 150 mg BID once she has been adequately anticoagulated to avoid premature chemical cardioversion.  Continue Lopressor 50 mg q 6 hours for heart rate >100.   2. Obesity Body mass index is 50.99 kg/m. Lifestyle modification was discussed and encouraged including regular physical activity and weight reduction.    Follow up in the AF clinic in 2 weeks.    [facility] [address] [place], [place] [address] [phone] 03/10/2020 10:13 AM

## 2020-03-24 NOTE — Progress Notes (Signed)
Primary Care Physician: Raina Mina., MD Primary Cardiologist: Dr Aundra Dubin (remotely) Primary Electrophysiologist: Dr Curt Bears Referring Physician: Dr Alcide Clever is a 54 y.o. female with a history of paroxysmal atrial fibrillation, atrial flutter, atrial tachycardia, HLD who presents for follow up in the [facility].  The patient was initially diagnosed with atrial arrhythmias in 2014. She has been maintained on flecainide and metoprolol. Patient is not on anticoagulation for a CHADS2VASC score of 1. She was in her usual state of health until 08/15/19 when she presented to the ED with generalized fatigue for the last two weeks and was found to be in rapid atrial flutter. She was rate controlled and discharged. Patient states she was being treated for an URI with prednisone and abx prior to the onset of symptoms. Patient is s/p TEE/DCCV on 08/20/19. Unfortunately, she had reoccurrence of her atrial flutter on 08/26/19 and was cardioverted in the ER again.   On follow up today, patient reports she feels improved with less dizziness since her heart rate is better controlled. She does note a weight gain of 2-3 lbs with increased lower extremity edema since her last visit. No orthopnea or PND. She denies any bleeding issues on anticoagulation.   Today, she denies symptoms of palpitations, chest pain, orthopnea, PND, lower extremity edema, presyncope, syncope, bleeding. The patient is tolerating medications without difficulties and is otherwise without complaint today.    Atrial Fibrillation Risk Factors:  she does not have symptoms or diagnosis of sleep apnea. Negative sleep study 09/29/19. she does not have a history of rheumatic fever. she does not have a history of alcohol use. The patient does not have a history of early familial atrial fibrillation or other arrhythmias.  she has a BMI of Body mass index is 51.32 kg/m.Marland Kitchen Filed Weights   03/25/20 0917   Weight: (!) 139.9 kg    Family History  Problem Relation Age of Onset  . Colon cancer Mother   . Rectal cancer Mother   . Heart disease Other        several family members on dad's side have had heart issues - earliest in their 67's  . Stroke Paternal Grandfather   . Diabetes Paternal Grandfather   . Breast cancer Sister   . Colon polyps Sister        x 2  . Stroke Paternal Grandmother   . Diabetes Paternal Grandmother      Atrial Fibrillation Management history:  Previous antiarrhythmic drugs: flecainide  Previous cardioversions: 08/20/19, 08/26/19 Previous ablations: none CHADS2VASC score: 1 Anticoagulation history: Eliquis   Past Medical History:  Diagnosis Date  . Atrial tachycardia ([place])    a. Dx 03/2013.  . Chest pain    a. 03/2013: cath negative for CAD, no PE by CT angio.  Marland Kitchen Hyperglycemia    a. A1c 5.8 in 03/2013.  Marland Kitchen Hyperlipidemia    a. Monitored by PCP - elected to pursue lifestyle modification in 09/2012 with reassessment pending.  Marland Kitchen PAC (premature atrial contraction)    a. Dx 03/2013.  Marland Kitchen PAF (paroxysmal atrial fibrillation) ([place])    a. Dx 03/2013 - started on flecainide. CHADSVASC = 1 so no anticoag at present time.  . Paroxysmal atrial flutter ([place])    a. Dx 03/2013.  . Pulmonary nodule, right    a. 87mm RML by CT angio 03/2013.   Past Surgical History:  Procedure Laterality Date  . APPENDECTOMY    . CARDIOVERSION N/A  08/20/2019   Procedure: CARDIOVERSION;  Surgeon: Lelon Perla, MD;  Location: [facility] ENDOSCOPY;  Service: Cardiovascular;  Laterality: N/A;  . CHOLECYSTECTOMY    . HERNIA REPAIR    . LEFT HEART CATHETERIZATION WITH CORONARY ANGIOGRAM N/A 03/28/2013   Procedure: LEFT HEART CATHETERIZATION WITH CORONARY ANGIOGRAM;  Surgeon: Ramond Dial, MD;  Location: [facility] CATH LAB;  Service: Cardiovascular;  Laterality: N/A;  . TEE WITHOUT CARDIOVERSION N/A 08/20/2019   Procedure: TRANSESOPHAGEAL ECHOCARDIOGRAM (TEE);  Surgeon: Lelon Perla, MD;   Location: [facility] ENDOSCOPY;  Service: Cardiovascular;  Laterality: N/A;    Current Outpatient Medications  Medication Sig Dispense Refill  . apixaban (ELIQUIS) 5 MG TABS tablet Take 1 tablet (5 mg total) by mouth 2 (two) times daily. 60 tablet 3  . ascorbic acid (VITAMIN C) 500 MG tablet Take 1,000 mg by mouth daily.    Marland Kitchen ELDERBERRY PO Take by mouth.    . flecainide (TAMBOCOR) 150 MG tablet Take 1 tablet (150 mg total) by mouth 2 (two) times daily. 60 tablet 2  . loratadine (CLARITIN) 10 MG tablet Take 10 mg by mouth at bedtime.     . metoprolol succinate (TOPROL-XL) 25 MG 24 hr tablet Take 1 tablet (25 mg total) by mouth in the morning and at bedtime. 60 tablet 6  . metoprolol tartrate (LOPRESSOR) 50 MG tablet Take 1 tablet (50 mg total) by mouth daily as needed for up to 1 dose (consistent heart greater than 120 (longer than an hour at rest)). 60 tablet 0  . pantoprazole (PROTONIX) 40 MG tablet Take 40 mg by mouth daily.    . furosemide (LASIX) 20 MG tablet Take 1 tablet by mouth daily for 3 days then only as needed for swelling/wt gain 10 tablet 1   No current facility-administered medications for this encounter.    No Known Allergies  Social History   Socioeconomic History  . Marital status: Married    Spouse name: Not on file  . Number of children: 2  . Years of education: Not on file  . 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 Smoking status: Never Smoker  . Smokeless tobacco: Never Used  Substance and Sexual Activity  . Alcohol use: Yes    Comment: rare - 2x/month  . Drug use: No  . Sexual activity: Yes  Other Topics Concern  . Not on file  Social History Narrative  . Not on file   Social Determinants of Health   Financial Resource Strain:   . Difficulty of Paying Living Expenses: Not on file  Food Insecurity:   . Worried About Charity fundraiser in the Last  Year: Not on file  . Ran Out of Food in the Last Year: Not on file  Transportation Needs:   . Lack of Transportation (Medical): Not on file  . Lack of Transportation (Non-Medical): Not on file  Physical Activity:   . Days of Exercise per Week: Not on file  . Minutes of Exercise per Session: Not on file  Stress:   . Feeling of Stress : Not on file  Social Connections:   . Frequency of Communication with Friends and Family: Not on file  . Frequency of Social Gatherings with Friends and Family: Not on file  . Attends Religious Services: Not on file  . Active Member of Clubs or Organizations: Not on file  .  Attends Archivist Meetings: Not on file  . Marital Status: Not on file  Intimate Partner Violence:   . Fear of Current or Ex-Partner: Not on file  . Emotionally Abused: Not on file  . Physically Abused: Not on file  . Sexually Abused: Not on file     ROS- All systems are reviewed and negative except as per the HPI above.  Physical Exam: Vitals:   03/25/20 0917  BP: 128/82  Pulse: (!) 102  Weight: (!) 139.9 kg  Height: 5\' 5"  (1.651 m)    GEN- The patient is well appearing obese female, alert and oriented x 3 today.   HEENT-head normocephalic, atraumatic, sclera clear, conjunctiva pink, hearing intact, trachea midline. Lungs- Clear to ausculation bilaterally, normal work of breathing Heart- irregular rate and rhythm, no murmurs, rubs or gallops  GI- soft, NT, ND, + BS Extremities- no clubbing, cyanosis, or edema MS- no significant deformity or atrophy Skin- no rash or lesion Psych- euthymic mood, full affect Neuro- strength and sensation are intact   Wt Readings from Last 3 Encounters:  03/25/20 (!) 139.9 kg  03/10/20 (!) 139 kg  12/11/19 130.8 kg    EKG today demonstrates atrial flutter with variable block, HR 102, QRS 126, QTc 432. NO acute STEMI, patient sitting comfortably NAD  Echo 03/29/19 demonstrated  1. Left ventricular ejection fraction, by  visual estimation, is 60 to  65%. The left ventricle has normal function. There is no left ventricular  hypertrophy.  2. The left ventricle has no regional wall motion abnormalities.  3. Global right ventricle has normal systolic function.The right  ventricular size is normal. No increase in right ventricular wall  thickness.  4. Left atrial size was normal.  5. Right atrial size was normal.  6. The mitral valve is normal in structure. Trivial mitral valve  regurgitation. No evidence of mitral stenosis.  7. The tricuspid valve is normal in structure. Tricuspid valve  regurgitation is not demonstrated.  8. The aortic valve was not well visualized. Aortic valve regurgitation  is not visualized. Mild to moderate aortic valve sclerosis/calcification  without any evidence of aortic stenosis.  9. The pulmonic valve was normal in structure. Pulmonic valve  regurgitation is not visualized.  10. Normal pulmonary artery systolic pressure.  11. The inferior vena cava is normal in size with greater than 50%  respiratory variability, suggesting right atrial pressure of 3 mmHg.   Epic records are reviewed at length today  CHA2DS2-VASc Score = 1  The patient's score is based upon: CHF History: 0 HTN History: 0 Diabetes History: 0 Stroke History: 0 Vascular Disease History: 0      ASSESSMENT AND PLAN: 1. Persistent Atrial Fibrillation/atrial flutter/atrial tachycardia The patient's CHA2DS2-VASc score is 1, indicating a 0.6% annual risk of stroke.   Patient in atrial flutter today, rate improved.  Continue Toprol 50 mg daily.  Continue Eliquis 5 mg BID, patient denies any missed doses.  Continue flecainide 100 mg BID for now.  Will plan on DCCV after 3 weeks of uninterrupted anticoagulation. Will increase flecainide to 150 mg BID at that time.    Check bmet/cbc Continue Lopressor 50 mg q 6 hours for heart rate >100.   2. Obesity Body mass index is 51.32 kg/m. Lifestyle  modification was discussed and encouraged including regular physical activity and weight reduction. Likely contributing to the persistence of her atrial arrhythmias.   3. Edema Patient having increased lower extremity edema and her weight is up 2-3 lbs  from last visit. Will start Lasix 20 mg x 3 days. bmet as above.    Follow up in the AF clinic after DCCV.    [facility] [address] [place], [place] [address] [phone] 03/25/2020 9:49 AM

## 2020-03-24 NOTE — H&P (View-Only) (Signed)
Primary Care Physician: Raina Mina., MD Primary Cardiologist: Dr Aundra Dubin (remotely) Primary Electrophysiologist: Dr Curt Bears Referring Physician: Dr Alcide Clever is a 54 y.o. female with a history of paroxysmal atrial fibrillation, atrial flutter, atrial tachycardia, HLD who presents for follow up in the [facility].  The patient was initially diagnosed with atrial arrhythmias in 2014. She has been maintained on flecainide and metoprolol. Patient is not on anticoagulation for a CHADS2VASC score of 1. She was in her usual state of health until 08/15/19 when she presented to the ED with generalized fatigue for the last two weeks and was found to be in rapid atrial flutter. She was rate controlled and discharged. Patient states she was being treated for an URI with prednisone and abx prior to the onset of symptoms. Patient is s/p TEE/DCCV on 08/20/19. Unfortunately, she had reoccurrence of her atrial flutter on 08/26/19 and was cardioverted in the ER again.   On follow up today, patient reports she feels improved with less dizziness since her heart rate is better controlled. She does note a weight gain of 2-3 lbs with increased lower extremity edema since her last visit. No orthopnea or PND. She denies any bleeding issues on anticoagulation.   Today, she denies symptoms of palpitations, chest pain, orthopnea, PND, lower extremity edema, presyncope, syncope, bleeding. The patient is tolerating medications without difficulties and is otherwise without complaint today.    Atrial Fibrillation Risk Factors:  she does not have symptoms or diagnosis of sleep apnea. Negative sleep study 09/29/19. she does not have a history of rheumatic fever. she does not have a history of alcohol use. The patient does not have a history of early familial atrial fibrillation or other arrhythmias.  she has a BMI of Body mass index is 51.32 kg/m.Marland Kitchen Filed Weights   03/25/20 0917   Weight: (!) 139.9 kg    Family History  Problem Relation Age of Onset  . Colon cancer Mother   . Rectal cancer Mother   . Heart disease Other        several family members on dad's side have had heart issues - earliest in their 43's  . Stroke Paternal Grandfather   . Diabetes Paternal Grandfather   . Breast cancer Sister   . Colon polyps Sister        x 2  . Stroke Paternal Grandmother   . Diabetes Paternal Grandmother      Atrial Fibrillation Management history:  Previous antiarrhythmic drugs: flecainide  Previous cardioversions: 08/20/19, 08/26/19 Previous ablations: none CHADS2VASC score: 1 Anticoagulation history: Eliquis   Past Medical History:  Diagnosis Date  . Atrial tachycardia ([place])    a. Dx 03/2013.  . Chest pain    a. 03/2013: cath negative for CAD, no PE by CT angio.  Marland Kitchen Hyperglycemia    a. A1c 5.8 in 03/2013.  Marland Kitchen Hyperlipidemia    a. Monitored by PCP - elected to pursue lifestyle modification in 09/2012 with reassessment pending.  Marland Kitchen PAC (premature atrial contraction)    a. Dx 03/2013.  Marland Kitchen PAF (paroxysmal atrial fibrillation) ([place])    a. Dx 03/2013 - started on flecainide. CHADSVASC = 1 so no anticoag at present time.  . Paroxysmal atrial flutter ([place])    a. Dx 03/2013.  . Pulmonary nodule, right    a. 11mm RML by CT angio 03/2013.   Past Surgical History:  Procedure Laterality Date  . APPENDECTOMY    . CARDIOVERSION N/A  08/20/2019   Procedure: CARDIOVERSION;  Surgeon: Lelon Perla, MD;  Location: [facility] ENDOSCOPY;  Service: Cardiovascular;  Laterality: N/A;  . CHOLECYSTECTOMY    . HERNIA REPAIR    . LEFT HEART CATHETERIZATION WITH CORONARY ANGIOGRAM N/A 03/28/2013   Procedure: LEFT HEART CATHETERIZATION WITH CORONARY ANGIOGRAM;  Surgeon: Ramond Dial, MD;  Location[facility] CATH LAB;  Service: Cardiovascular;  Laterality: N/A;  . TEE WITHOUT CARDIOVERSION N/A 08/20/2019   Procedure: TRANSESOPHAGEAL ECHOCARDIOGRAM (TEE);  Surgeon: Lelon Perla, MD;   Location: [facility] ENDOSCOPY;  Service: Cardiovascular;  Laterality: N/A;    Current Outpatient Medications  Medication Sig Dispense Refill  . apixaban (ELIQUIS) 5 MG TABS tablet Take 1 tablet (5 mg total) by mouth 2 (two) times daily. 60 tablet 3  . ascorbic acid (VITAMIN C) 500 MG tablet Take 1,000 mg by mouth daily.    Marland Kitchen ELDERBERRY PO Take by mouth.    . flecainide (TAMBOCOR) 150 MG tablet Take 1 tablet (150 mg total) by mouth 2 (two) times daily. 60 tablet 2  . loratadine (CLARITIN) 10 MG tablet Take 10 mg by mouth at bedtime.     . metoprolol succinate (TOPROL-XL) 25 MG 24 hr tablet Take 1 tablet (25 mg total) by mouth in the morning and at bedtime. 60 tablet 6  . metoprolol tartrate (LOPRESSOR) 50 MG tablet Take 1 tablet (50 mg total) by mouth daily as needed for up to 1 dose (consistent heart greater than 120 (longer than an hour at rest)). 60 tablet 0  . pantoprazole (PROTONIX) 40 MG tablet Take 40 mg by mouth daily.    . furosemide (LASIX) 20 MG tablet Take 1 tablet by mouth daily for 3 days then only as needed for swelling/wt gain 10 tablet 1   No current facility-administered medications for this encounter.    No Known Allergies  Social History   Socioeconomic History  . Marital status: Married    Spouse name: Not on file  . Number of children: 2  . Years of education: Not on file  . 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 Smoking status: Never Smoker  . Smokeless tobacco: Never Used  Substance and Sexual Activity  . Alcohol use: Yes    Comment: rare - 2x/month  . Drug use: No  . Sexual activity: Yes  Other Topics Concern  . Not on file  Social History Narrative  . Not on file   Social Determinants of Health   Financial Resource Strain:   . Difficulty of Paying Living Expenses: Not on file  Food Insecurity:   . Worried About Charity fundraiser in the Last  Year: Not on file  . Ran Out of Food in the Last Year: Not on file  Transportation Needs:   . Lack of Transportation (Medical): Not on file  . Lack of Transportation (Non-Medical): Not on file  Physical Activity:   . Days of Exercise per Week: Not on file  . Minutes of Exercise per Session: Not on file  Stress:   . Feeling of Stress : Not on file  Social Connections:   . Frequency of Communication with Friends and Family: Not on file  . Frequency of Social Gatherings with Friends and Family: Not on file  . Attends Religious Services: Not on file  . Active Member of Clubs or Organizations: Not on file  .  Attends Archivist Meetings: Not on file  . Marital Status: Not on file  Intimate Partner Violence:   . Fear of Current or Ex-Partner: Not on file  . Emotionally Abused: Not on file  . Physically Abused: Not on file  . Sexually Abused: Not on file     ROS- All systems are reviewed and negative except as per the HPI above.  Physical Exam: Vitals:   03/25/20 0917  BP: 128/82  Pulse: (!) 102  Weight: (!) 139.9 kg  Height: 5\' 5"  (1.651 m)    GEN- The patient is well appearing obese female, alert and oriented x 3 today.   HEENT-head normocephalic, atraumatic, sclera clear, conjunctiva pink, hearing intact, trachea midline. Lungs- Clear to ausculation bilaterally, normal work of breathing Heart- irregular rate and rhythm, no murmurs, rubs or gallops  GI- soft, NT, ND, + BS Extremities- no clubbing, cyanosis, or edema MS- no significant deformity or atrophy Skin- no rash or lesion Psych- euthymic mood, full affect Neuro- strength and sensation are intact   Wt Readings from Last 3 Encounters:  03/25/20 (!) 139.9 kg  03/10/20 (!) 139 kg  12/11/19 130.8 kg    EKG today demonstrates atrial flutter with variable block, HR 102, QRS 126, QTc 432. NO acute STEMI, patient sitting comfortably NAD  Echo 03/29/19 demonstrated  1. Left ventricular ejection fraction, by  visual estimation, is 60 to  65%. The left ventricle has normal function. There is no left ventricular  hypertrophy.  2. The left ventricle has no regional wall motion abnormalities.  3. Global right ventricle has normal systolic function.The right  ventricular size is normal. No increase in right ventricular wall  thickness.  4. Left atrial size was normal.  5. Right atrial size was normal.  6. The mitral valve is normal in structure. Trivial mitral valve  regurgitation. No evidence of mitral stenosis.  7. The tricuspid valve is normal in structure. Tricuspid valve  regurgitation is not demonstrated.  8. The aortic valve was not well visualized. Aortic valve regurgitation  is not visualized. Mild to moderate aortic valve sclerosis/calcification  without any evidence of aortic stenosis.  9. The pulmonic valve was normal in structure. Pulmonic valve  regurgitation is not visualized.  10. Normal pulmonary artery systolic pressure.  11. The inferior vena cava is normal in size with greater than 50%  respiratory variability, suggesting right atrial pressure of 3 mmHg.   Epic records are reviewed at length today  CHA2DS2-VASc Score = 1  The patient's score is based upon: CHF History: 0 HTN History: 0 Diabetes History: 0 Stroke History: 0 Vascular Disease History: 0      ASSESSMENT AND PLAN: 1. Persistent Atrial Fibrillation/atrial flutter/atrial tachycardia The patient's CHA2DS2-VASc score is 1, indicating a 0.6% annual risk of stroke.   Patient in atrial flutter today, rate improved.  Continue Toprol 50 mg daily.  Continue Eliquis 5 mg BID, patient denies any missed doses.  Continue flecainide 100 mg BID for now.  Will plan on DCCV after 3 weeks of uninterrupted anticoagulation. Will increase flecainide to 150 mg BID at that time.    Check bmet/cbc Continue Lopressor 50 mg q 6 hours for heart rate >100.   2. Obesity Body mass index is 51.32 kg/m. Lifestyle  modification was discussed and encouraged including regular physical activity and weight reduction. Likely contributing to the persistence of her atrial arrhythmias.   3. Edema Patient having increased lower extremity edema and her weight is up 2-3 lbs  from last visit. Will start Lasix 20 mg x 3 days. bmet as above.    Follow up in the AF clinic after DCCV.    [facility] [address] [place],  [address] [phone] 03/25/2020 9:49 AM

## 2020-03-25 ENCOUNTER — Other Ambulatory Visit: Payer: Self-pay

## 2020-03-25 ENCOUNTER — Encounter (HOSPITAL_COMMUNITY): Payer: Self-pay | Admitting: Physician Assistant

## 2020-03-25 ENCOUNTER — Ambulatory Visit (HOSPITAL_COMMUNITY)
Admission: RE | Admit: 2020-03-25 | Discharge: 2020-03-25 | Disposition: A | Discharge status: Home / Self Care | Payer: Medicaid Other | Source: Ambulatory Visit | Attending: Physician Assistant | Admitting: Physician Assistant

## 2020-03-25 DIAGNOSIS — Z79899 Other long term (current) drug therapy: Secondary | ICD-10-CM | POA: Diagnosis not present

## 2020-03-25 DIAGNOSIS — I4892 Unspecified atrial flutter: Secondary | ICD-10-CM | POA: Diagnosis not present

## 2020-03-25 DIAGNOSIS — R600 Localized edema: Secondary | ICD-10-CM | POA: Insufficient documentation

## 2020-03-25 DIAGNOSIS — Z7901 Long term (current) use of anticoagulants: Secondary | ICD-10-CM | POA: Diagnosis not present

## 2020-03-25 DIAGNOSIS — E669 Obesity, unspecified: Secondary | ICD-10-CM | POA: Diagnosis not present

## 2020-03-25 DIAGNOSIS — E785 Hyperlipidemia, unspecified: Secondary | ICD-10-CM | POA: Diagnosis not present

## 2020-03-25 DIAGNOSIS — Z6841 Body Mass Index (BMI) 40.0 and over, adult: Secondary | ICD-10-CM | POA: Insufficient documentation

## 2020-03-25 DIAGNOSIS — I471 Supraventricular tachycardia: Secondary | ICD-10-CM | POA: Diagnosis not present

## 2020-03-25 DIAGNOSIS — I4819 Other persistent atrial fibrillation: Secondary | ICD-10-CM | POA: Insufficient documentation

## 2020-03-25 LAB — BASIC METABOLIC PANEL
Anion gap: 10 (ref 5–15)
BUN: 11 mg/dL (ref 6–20)
CO2: 22 mmol/L (ref 22–32)
Calcium: 9 mg/dL (ref 8.9–10.3)
Chloride: 106 mmol/L (ref 98–111)
Creatinine, Ser: 0.92 mg/dL (ref 0.44–1.00)
GFR, Estimated: >60 mL/min (ref >60)
Glucose, Bld: 148 mg/dL — ABNORMAL HIGH (ref 70–99)
Potassium: 4.1 mmol/L (ref 3.5–5.1)
Sodium: 138 mmol/L (ref 135–145)

## 2020-03-25 LAB — CBC
HCT: 44.1 % (ref 36.0–46.0)
Hemoglobin: 14.5 g/dL (ref 12.0–15.0)
MCH: 31 pg (ref 26.0–34.0)
MCHC: 32.9 g/dL (ref 30.0–36.0)
MCV: 94.2 fL (ref 80.0–100.0)
Platelets: 227 10*3/uL (ref 150–400)
RBC: 4.68 MIL/uL (ref 3.87–5.11)
RDW: 13.2 % (ref 11.5–15.5)
WBC: 6.9 10*3/uL (ref 4.0–10.5)
nRBC: 0 % (ref 0.0–0.2)

## 2020-03-25 MED ORDER — FUROSEMIDE 20 MG PO TABS: ORAL_TABLET | ORAL | 1 refills | Status: DC

## 2020-03-25 MED ORDER — FLECAINIDE ACETATE 150 MG PO TABS: 150.0000 mg | ORAL_TABLET | Freq: Two times a day (BID) | ORAL | 2 refills | Status: DC

## 2020-03-25 NOTE — Patient Instructions (Signed)
Cardioversion scheduled for Tuesday, December 14th  - Arrive at the [facility] and go to admitting at 1130AM  - Do not eat or drink anything after midnight the night prior to your procedure.  - Take all your morning medication (except diabetic medications) with a sip of water prior to arrival.  - You will not be able to drive home after your procedure.  - Do NOT miss any doses of your blood thinner - if you should miss a dose please notify our office immediately.  - If you feel as if you go back into normal rhythm prior to scheduled cardioversion, please notify our office immediately. If your procedure is canceled in the cardioversion suite you will be charged a cancellation fee.  Lasix 20mg  -- take 1 tablet daily for the next 3 days then only as needed for swelling or weight gain.   On December 14th - increase flecainide to 150mg  twice a day

## 2020-03-28 ENCOUNTER — Other Ambulatory Visit (HOSPITAL_COMMUNITY)
Admission: RE | Admit: 2020-03-28 | Discharge: 2020-03-28 | Disposition: A | Discharge status: Home / Self Care | Payer: Medicaid Other | Source: Ambulatory Visit | Attending: Cardiology | Admitting: Cardiology

## 2020-03-28 DIAGNOSIS — Z20822 Contact with and (suspected) exposure to covid-19: Secondary | ICD-10-CM | POA: Insufficient documentation

## 2020-03-28 DIAGNOSIS — Z01812 Encounter for preprocedural laboratory examination: Secondary | ICD-10-CM | POA: Insufficient documentation

## 2020-03-29 LAB — SARS CORONAVIRUS 2 (TAT 6-24 HRS): SARS Coronavirus 2: NEGATIVE

## 2020-03-31 ENCOUNTER — Ambulatory Visit (HOSPITAL_COMMUNITY)
Admission: RE | Admit: 2020-03-31 | Discharge: 2020-03-31 | Disposition: A | Discharge status: Home / Self Care | Payer: Medicaid Other | Attending: Cardiology | Admitting: Cardiology

## 2020-03-31 ENCOUNTER — Other Ambulatory Visit: Payer: Self-pay

## 2020-03-31 ENCOUNTER — Encounter (HOSPITAL_COMMUNITY)
Admission: RE | Disposition: A | Discharge status: Home / Self Care | Payer: Self-pay | Source: Home / Self Care | Attending: Cardiology

## 2020-03-31 ENCOUNTER — Encounter (HOSPITAL_COMMUNITY): Payer: Self-pay | Admitting: Cardiology

## 2020-03-31 ENCOUNTER — Ambulatory Visit (HOSPITAL_COMMUNITY): Payer: Medicaid Other | Admitting: Anesthesiology

## 2020-03-31 DIAGNOSIS — Z79899 Other long term (current) drug therapy: Secondary | ICD-10-CM | POA: Diagnosis not present

## 2020-03-31 DIAGNOSIS — Z6841 Body Mass Index (BMI) 40.0 and over, adult: Secondary | ICD-10-CM | POA: Insufficient documentation

## 2020-03-31 DIAGNOSIS — Z7901 Long term (current) use of anticoagulants: Secondary | ICD-10-CM | POA: Insufficient documentation

## 2020-03-31 DIAGNOSIS — I4892 Unspecified atrial flutter: Secondary | ICD-10-CM | POA: Insufficient documentation

## 2020-03-31 DIAGNOSIS — I471 Supraventricular tachycardia: Secondary | ICD-10-CM | POA: Diagnosis not present

## 2020-03-31 DIAGNOSIS — R600 Localized edema: Secondary | ICD-10-CM | POA: Insufficient documentation

## 2020-03-31 DIAGNOSIS — I480 Paroxysmal atrial fibrillation: Secondary | ICD-10-CM

## 2020-03-31 DIAGNOSIS — I4819 Other persistent atrial fibrillation: Secondary | ICD-10-CM | POA: Insufficient documentation

## 2020-03-31 DIAGNOSIS — E669 Obesity, unspecified: Secondary | ICD-10-CM | POA: Insufficient documentation

## 2020-03-31 HISTORY — DX: Cardiac arrhythmia, unspecified: I49.9

## 2020-03-31 HISTORY — PX: CARDIOVERSION: SHX1299

## 2020-03-31 SURGERY — CARDIOVERSION
Anesthesia: General

## 2020-03-31 MED ORDER — PROPOFOL 10 MG/ML IV BOLUS
INTRAVENOUS | Status: DC | PRN
  Administered 2020-03-31: 100 mg via INTRAVENOUS

## 2020-03-31 MED ORDER — LIDOCAINE 2% (20 MG/ML) 5 ML SYRINGE
INTRAMUSCULAR | Status: DC | PRN
  Administered 2020-03-31: 50 mg via INTRAVENOUS

## 2020-03-31 MED ORDER — SODIUM CHLORIDE 0.9 % IV SOLN: INTRAVENOUS | Status: DC | PRN

## 2020-03-31 NOTE — Transfer of Care (Signed)
Immediate Anesthesia Transfer of Care Note  Patient: ENYAH MOMAN  Procedure(s) Performed: CARDIOVERSION (N/A )  Patient Location: Endoscopy Unit  Anesthesia Type:General  Level of Consciousness: sedated and patient cooperative  Airway & Oxygen Therapy: Patient Spontanous Breathing and Patient connected to nasal cannula oxygen  Post-op Assessment: Report given to RN, Post -op Vital signs reviewed and stable and Patient moving all extremities  Post vital signs: Reviewed and stable  Last Vitals:  Vitals Value Taken Time  BP    Temp    Pulse    Resp    SpO2      Last Pain:  Vitals:   03/31/20 1141  PainSc: 0-No pain         Complications: No complications documented.

## 2020-03-31 NOTE — CV Procedure (Signed)
Procedure:   DCCV  Indication:  Symptomatic atrial fibrillation  Procedure Note:  The patient signed informed consent.  They have had had therapeutic anticoagulation with apixaban greater than 3 weeks.  Anesthesia was administered by Dr. Marcie Bal.  Patient received 50 mg IV lidocaine and 100 mg IV propofol.Adequate airway was maintained throughout and vital followed per protocol.  They were cardioverted x 1 with 150J of biphasic synchronized energy.  They converted to NSR.  There were no apparent complications.  The patient had normal neuro status and respiratory status post procedure with vitals stable as recorded elsewhere.    Follow up:  They will continue on current medical therapy and follow up with cardiology as scheduled.  Buford Dresser, MD PhD 03/31/2020 12:23 PM

## 2020-03-31 NOTE — Interval H&P Note (Signed)
History and Physical Interval Note:  03/31/2020 12:13 PM  Sandra Stark  has presented today for surgery, with the diagnosis of AFIB.  The various methods of treatment have been discussed with the patient and family. After consideration of risks, benefits and other options for treatment, the patient has consented to  Procedure(s): CARDIOVERSION (N/A) as a surgical intervention.  The patient's history has been reviewed, patient examined, no change in status, stable for surgery.  I have reviewed the patient's chart and labs.  Questions were answered to the patient's satisfaction.     [name] Harrell Gave

## 2020-03-31 NOTE — Anesthesia Preprocedure Evaluation (Signed)
Anesthesia Evaluation  Patient identified by MRN, date of birth, ID band Patient awake    Reviewed: Allergy & Precautions, H&P , NPO status , Patient's Chart, lab work & pertinent test results  Airway Mallampati: II   Neck ROM: full    Dental   Pulmonary sleep apnea ,    breath sounds clear to auscultation       Cardiovascular + dysrhythmias Atrial Fibrillation  Rhythm:irregular Rate:Normal     Neuro/Psych    GI/Hepatic   Endo/Other  Morbid obesity  Renal/GU      Musculoskeletal   Abdominal   Peds  Hematology   Anesthesia Other Findings   Reproductive/Obstetrics                             Anesthesia Physical Anesthesia Plan  ASA: III  Anesthesia Plan: General   Post-op Pain Management:    Induction: Intravenous  PONV Risk Score and Plan: 2 and Treatment may vary due to age or medical condition and Propofol infusion  Airway Management Planned: Mask  Additional Equipment:   Intra-op Plan:   Post-operative Plan:   Informed Consent: I have reviewed the patients History and Physical, chart, labs and discussed the procedure including the risks, benefits and alternatives for the proposed anesthesia with the patient or authorized representative who has indicated his/her understanding and acceptance.       Plan Discussed with: CRNA, Anesthesiologist and Surgeon  Anesthesia Plan Comments:         Anesthesia Quick Evaluation

## 2020-03-31 NOTE — Discharge Instructions (Signed)
Electrical Cardioversion   What can I expect after the procedure?  Your blood pressure, heart rate, breathing rate, and blood oxygen level will be monitored until you leave the hospital or clinic.  Your heart rhythm will be watched to make sure it does not change.  You may have some redness on the skin where the shocks were given. Follow these instructions at home:  Do not drive for 24 hours if you were given a sedative during your procedure.  Take over-the-counter and prescription medicines only as told by your health care provider.  Ask your health care provider how to check your pulse. Check it often.  Rest for 48 hours after the procedure or as told by your health care provider.  Avoid or limit your caffeine use as told by your health care provider.  Keep all follow-up visits as told by your health care provider. This is important. Contact a health care provider if:  You feel like your heart is beating too quickly or your pulse is not regular.  You have a serious muscle cramp that does not go away. Get help right away if:  You have discomfort in your chest.  You are dizzy or you feel faint.  You have trouble breathing or you are short of breath.  Your speech is slurred.  You have trouble moving an arm or leg on one side of your body.  Your fingers or toes turn cold or blue. Summary  Electrical cardioversion is the delivery of a jolt of electricity to restore a normal rhythm to the heart.  This procedure may be done right away in an emergency or may be a scheduled procedure if the condition is not an emergency.  Generally, this is a safe procedure.  After the procedure, check your pulse often as told by your health care provider. This information is not intended to replace advice given to you by your health care provider. Make sure you discuss any questions you have with your health care provider. Document Revised: 11/05/2018 Document Reviewed:  11/05/2018 Elsevier Patient Education  2020 Elsevier Inc.  

## 2020-04-01 NOTE — Anesthesia Postprocedure Evaluation (Signed)
Anesthesia Post Note  Patient: BELLANIE MATTHEW  Procedure(s) Performed: CARDIOVERSION (N/A )     Patient location during evaluation: Endoscopy Anesthesia Type: General Level of consciousness: awake and alert Pain management: pain level controlled Vital Signs Assessment: post-procedure vital signs reviewed and stable Respiratory status: spontaneous breathing, nonlabored ventilation, respiratory function stable and patient connected to nasal cannula oxygen Cardiovascular status: blood pressure returned to baseline and stable Postop Assessment: no apparent nausea or vomiting Anesthetic complications: no   No complications documented.  Last Vitals:  Vitals:   03/31/20 1240 03/31/20 1250  BP: (!) 106/57 104/66  Pulse: 85 83  Resp: 16 14  Temp:    SpO2: 96% 97%    Last Pain:  Vitals:   03/31/20 1250  TempSrc:   PainSc: 0-No pain   Pain Goal:                   Xavior Niazi S

## 2020-04-01 NOTE — Progress Notes (Signed)
Primary Care Physician: Raina Mina., MD Primary Cardiologist: Dr Aundra Dubin (remotely) Primary Electrophysiologist: Dr Curt Bears Referring Physician: Dr Alcide Clever is a 54 y.o. female with a history of paroxysmal atrial fibrillation, atrial flutter, atrial tachycardia, HLD who presents for follow up in the [facility].  The patient was initially diagnosed with atrial arrhythmias in 2014. She has been maintained on flecainide and metoprolol. Patient is not on anticoagulation for a CHADS2VASC score of 1. She was in her usual state of health until 08/15/19 when she presented to the ED with generalized fatigue for the last two weeks and was found to be in rapid atrial flutter. She was rate controlled and discharged. Patient states she was being treated for an URI with prednisone and abx prior to the onset of symptoms. Patient is s/p TEE/DCCV on 08/20/19. Unfortunately, she had reoccurrence of her atrial flutter on 08/26/19 and was cardioverted in the ER again.   On follow up today, patient is s/p DCCV on 03/31/20. She reports that she feels much better with less SOB and more energy. She does have some mild dizziness with increasing the flecainide but this is improving.   Today, she denies symptoms of palpitations, chest pain, orthopnea, PND, lower extremity edema, presyncope, syncope, bleeding. The patient is tolerating medications without difficulties and is otherwise without complaint today.    Atrial Fibrillation Risk Factors:  she does not have symptoms or diagnosis of sleep apnea. Negative sleep study 09/29/19. she does not have a history of rheumatic fever. she does not have a history of alcohol use. The patient does not have a history of early familial atrial fibrillation or other arrhythmias.  she has a BMI of Body mass index is 51.09 kg/m.Marland Kitchen Filed Weights   04/02/20 0841  Weight: (!) 139.3 kg    Family History  Problem Relation Age of Onset  .  Colon cancer Mother   . Rectal cancer Mother   . Heart disease Other        several family members on dad's side have had heart issues - earliest in their 39's  . Stroke Paternal Grandfather   . Diabetes Paternal Grandfather   . Breast cancer Sister   . Colon polyps Sister        x 2  . Stroke Paternal Grandmother   . Diabetes Paternal Grandmother      Atrial Fibrillation Management history:  Previous antiarrhythmic drugs: flecainide  Previous cardioversions: 08/20/19, 08/26/19, 03/31/20 Previous ablations: none CHADS2VASC score: 1 Anticoagulation history: Eliquis   Past Medical History:  Diagnosis Date  . Atrial tachycardia ([place])    a. Dx 03/2013.  . Chest pain    a. 03/2013: cath negative for CAD, no PE by CT angio.  Marland Kitchen Dysrhythmia   . Hyperglycemia    a. A1c 5.8 in 03/2013.  Marland Kitchen Hyperlipidemia    a. Monitored by PCP - elected to pursue lifestyle modification in 09/2012 with reassessment pending.  Marland Kitchen PAC (premature atrial contraction)    a. Dx 03/2013.  Marland Kitchen PAF (paroxysmal atrial fibrillation) ([place])    a. Dx 03/2013 - started on flecainide. CHADSVASC = 1 so no anticoag at present time.  . Paroxysmal atrial flutter ([place])    a. Dx 03/2013.  . Pulmonary nodule, right    a. 17mm RML by CT angio 03/2013.   Past Surgical History:  Procedure Laterality Date  . APPENDECTOMY    . CARDIOVERSION N/A 08/20/2019   Procedure: CARDIOVERSION;  Surgeon: Lelon Perla, MD;  Location: [facility] ENDOSCOPY;  Service: Cardiovascular;  Laterality: N/A;  . CARDIOVERSION N/A 03/31/2020   Procedure: CARDIOVERSION;  Surgeon: Buford Dresser, MD;  Location: [facility] ENDOSCOPY;  Service: Cardiovascular;  Laterality: N/A;  . CHOLECYSTECTOMY    . HERNIA REPAIR    . LEFT HEART CATHETERIZATION WITH CORONARY ANGIOGRAM N/A 03/28/2013   Procedure: LEFT HEART CATHETERIZATION WITH CORONARY ANGIOGRAM;  Surgeon: Ramond Dial, MD;  Location: [facility] CATH LAB;  Service: Cardiovascular;  Laterality: N/A;  . TEE  WITHOUT CARDIOVERSION N/A 08/20/2019   Procedure: TRANSESOPHAGEAL ECHOCARDIOGRAM (TEE);  Surgeon: Lelon Perla, MD;  Location: [facility] ENDOSCOPY;  Service: Cardiovascular;  Laterality: N/A;    Current Outpatient Medications  Medication Sig Dispense Refill  . apixaban (ELIQUIS) 5 MG TABS tablet Take 1 tablet (5 mg total) by mouth 2 (two) times daily. 60 tablet 3  . Ascorbic Acid (VITAMIN C) 1000 MG tablet Take 1,000 mg by mouth every evening.    Marland Kitchen ELDERBERRY PO Take 15 mLs by mouth at bedtime.     . flecainide (TAMBOCOR) 150 MG tablet Take 1 tablet (150 mg total) by mouth 2 (two) times daily. 60 tablet 2  . furosemide (LASIX) 20 MG tablet Take 1 tablet by mouth daily for 3 days then only as needed for swelling/wt gain (Patient taking differently: Take 20 mg by mouth See admin instructions. Take 1 tablet by mouth daily for 3 days then only as needed for swelling/wt gain) 10 tablet 1  . loratadine (CLARITIN) 10 MG tablet Take 10 mg by mouth at bedtime.     . metoprolol succinate (TOPROL-XL) 25 MG 24 hr tablet Take 1 tablet (25 mg total) by mouth in the morning and at bedtime. 60 tablet 6  . metoprolol tartrate (LOPRESSOR) 50 MG tablet Take 1 tablet (50 mg total) by mouth daily as needed for up to 1 dose (consistent heart greater than 120 (longer than an hour at rest)). 60 tablet 0  . Multiple Vitamins-Minerals (MULTIVITAMIN WITH MINERALS) tablet Take 1 tablet by mouth daily.    . pantoprazole (PROTONIX) 40 MG tablet Take 40 mg by mouth daily.     No current facility-administered medications for this encounter.    No Known Allergies  Social History   Socioeconomic History  . Marital status: Married    Spouse name: Not on file  . Number of children: 2  . Years of education: Not on file  . Highest education level: Not on file  Occupational History    Employer: [employer]    Comment: Adminstrative assistance  Tobacco Use  . Smoking status: Never Smoker  . Smokeless tobacco: Never  Used  Vaping Use  . Vaping Use: Never used  Substance and Sexual Activity  . Alcohol use: Yes    Comment: rare - 2x/month  . Drug use: No  . Sexual activity: Yes  Other Topics Concern  . Not on file  Social History Narrative  . Not on file   Social Determinants of Health   Financial Resource Strain: Not on file  Food Insecurity: Not on file  Transportation Needs: Not on file  Physical Activity: Not on file  Stress: Not on file  Social Connections: Not on file  Intimate Partner Violence: Not on file     ROS- All systems are reviewed and negative except as per the HPI above.  Physical Exam: Vitals:   04/02/20 0841  BP: 122/72  Pulse: 76  Weight: (!) 139.3 kg  Height: 5\' 5"  (1.651 m)    GEN- The patient is well appearing obese female, alert and oriented x 3 today.   HEENT-head normocephalic, atraumatic, sclera clear, conjunctiva pink, hearing intact, trachea midline. Lungs- Clear to ausculation bilaterally, normal work of breathing Heart- Regular rate and rhythm, no murmurs, rubs or gallops  GI- soft, NT, ND, + BS Extremities- no clubbing, cyanosis, or edema MS- no significant deformity or atrophy Skin- no rash or lesion Psych- euthymic mood, full affect Neuro- strength and sensation are intact   Wt Readings from Last 3 Encounters:  04/02/20 (!) 139.3 kg  03/31/20 (!) 140 kg  03/25/20 (!) 139.9 kg    EKG today demonstrates SR HR 76, 1st degree AV block, PR 214, QRS 104, QTc 490  Echo 03/29/19 demonstrated  1. Left ventricular ejection fraction, by visual estimation, is 60 to  65%. The left ventricle has normal function. There is no left ventricular  hypertrophy.  2. The left ventricle has no regional wall motion abnormalities.  3. Global right ventricle has normal systolic function.The right  ventricular size is normal. No increase in right ventricular wall  thickness.  4. Left atrial size was normal.  5. Right atrial size was normal.  6. The  mitral valve is normal in structure. Trivial mitral valve  regurgitation. No evidence of mitral stenosis.  7. The tricuspid valve is normal in structure. Tricuspid valve  regurgitation is not demonstrated.  8. The aortic valve was not well visualized. Aortic valve regurgitation  is not visualized. Mild to moderate aortic valve sclerosis/calcification  without any evidence of aortic stenosis.  9. The pulmonic valve was normal in structure. Pulmonic valve  regurgitation is not visualized.  10. Normal pulmonary artery systolic pressure.  11. The inferior vena cava is normal in size with greater than 50%  respiratory variability, suggesting right atrial pressure of 3 mmHg.   Epic records are reviewed at length today  CHA2DS2-VASc Score = 1  The patient's score is based upon: CHF History: No HTN History: No Diabetes History: No Stroke History: No Vascular Disease History: No      ASSESSMENT AND PLAN: 1. Persistent Atrial Fibrillation/atrial flutter/atrial tachycardia The patient's CHA2DS2-VASc score is 1, indicating a 0.6% annual risk of stroke.   S/p DCCV 03/31/20 Patient back in SR. Continue Toprol 50 mg daily.  Continue Eliquis 5 mg BID for at least 4 weeks post DCCV. Continue flecainide 150 mg BID Continue Lopressor 50 mg q 6 hours for heart rate >100.   2. Obesity Body mass index is 51.09 kg/m. Lifestyle modification was discussed and encouraged including regular physical activity and weight reduction. Will refer patient to [facility] exercise program.   3. Edema Resolved post DCCV with short course Lasix.   Follow up with Dr Curt Bears in 3 months. AF clinic in 6 months.    [facility] [address] [place], [place] [address] [phone] 04/02/2020 8:49 AM

## 2020-04-02 ENCOUNTER — Encounter (HOSPITAL_COMMUNITY): Payer: Self-pay | Admitting: Physician Assistant

## 2020-04-02 ENCOUNTER — Other Ambulatory Visit: Payer: Self-pay

## 2020-04-02 ENCOUNTER — Ambulatory Visit (HOSPITAL_COMMUNITY): Payer: Medicaid Other | Admitting: Physician Assistant

## 2020-04-02 ENCOUNTER — Ambulatory Visit (HOSPITAL_COMMUNITY)
Admission: RE | Admit: 2020-04-02 | Discharge: 2020-04-02 | Disposition: A | Discharge status: Home / Self Care | Payer: Medicaid Other | Source: Ambulatory Visit | Attending: Physician Assistant | Admitting: Physician Assistant

## 2020-04-02 VITALS — BP 122/72 | HR 76 | Ht 65.0 in | Wt 307.0 lb

## 2020-04-02 DIAGNOSIS — Z7901 Long term (current) use of anticoagulants: Secondary | ICD-10-CM | POA: Insufficient documentation

## 2020-04-02 DIAGNOSIS — I4819 Other persistent atrial fibrillation: Secondary | ICD-10-CM | POA: Insufficient documentation

## 2020-04-02 DIAGNOSIS — I484 Atypical atrial flutter: Secondary | ICD-10-CM | POA: Diagnosis not present

## 2020-04-02 DIAGNOSIS — E785 Hyperlipidemia, unspecified: Secondary | ICD-10-CM | POA: Diagnosis not present

## 2020-04-02 DIAGNOSIS — Z6841 Body Mass Index (BMI) 40.0 and over, adult: Secondary | ICD-10-CM | POA: Diagnosis not present

## 2020-04-02 DIAGNOSIS — R9431 Abnormal electrocardiogram [ECG] [EKG]: Secondary | ICD-10-CM | POA: Insufficient documentation

## 2020-04-02 DIAGNOSIS — I4892 Unspecified atrial flutter: Secondary | ICD-10-CM | POA: Insufficient documentation

## 2020-04-02 DIAGNOSIS — E669 Obesity, unspecified: Secondary | ICD-10-CM | POA: Insufficient documentation

## 2020-04-02 DIAGNOSIS — I480 Paroxysmal atrial fibrillation: Secondary | ICD-10-CM

## 2020-04-02 DIAGNOSIS — I4430 Unspecified atrioventricular block: Secondary | ICD-10-CM | POA: Insufficient documentation

## 2020-04-02 NOTE — Patient Instructions (Signed)
Continue Eliquis until 01/11 Keep on hand

## 2020-04-02 NOTE — Addendum Note (Signed)
Encounter addended by: Juluis Mire, RN on: 04/02/2020 9:43 AM  Actions taken: Visit diagnoses modified, Order list changed, Diagnosis association updated

## 2020-04-03 ENCOUNTER — Telehealth (HOSPITAL_COMMUNITY): Payer: Self-pay | Admitting: *Deleted

## 2020-04-03 NOTE — Telephone Encounter (Signed)
Pt called in stating for the last 2 days she has not felt as well. Just tired and slightly short of breath. HR is in the 65-70 range doesn't feel like she is out of rhythm. Her BP is up and down. Currently it is 95/54 earlier it was 158/113. Encouraged low sodium diet and good PO intake - she will try taking her medications with food see if this helps her symptoms. She will call on Monday if still having issues.  Had recheck with Adline Peals PA yesterday only complaint was slightly dizzy which pt does not endorse today.

## 2020-05-14 ENCOUNTER — Other Ambulatory Visit (HOSPITAL_COMMUNITY): Payer: Self-pay | Admitting: Physician Assistant

## 2020-05-14 ENCOUNTER — Other Ambulatory Visit: Payer: Self-pay | Admitting: Cardiology

## 2020-05-14 DIAGNOSIS — I480 Paroxysmal atrial fibrillation: Secondary | ICD-10-CM

## 2020-05-14 DIAGNOSIS — I4892 Unspecified atrial flutter: Secondary | ICD-10-CM

## 2020-07-06 ENCOUNTER — Encounter: Payer: Self-pay | Admitting: Cardiology

## 2020-07-06 ENCOUNTER — Other Ambulatory Visit: Payer: Self-pay

## 2020-07-06 ENCOUNTER — Ambulatory Visit (INDEPENDENT_AMBULATORY_CARE_PROVIDER_SITE_OTHER): Payer: Medicaid Other | Admitting: Cardiology

## 2020-07-06 VITALS — BP 124/74 | HR 66 | Ht 64.0 in | Wt 312.6 lb

## 2020-07-06 DIAGNOSIS — I480 Paroxysmal atrial fibrillation: Secondary | ICD-10-CM

## 2020-07-06 DIAGNOSIS — I4892 Unspecified atrial flutter: Secondary | ICD-10-CM | POA: Diagnosis not present

## 2020-07-06 DIAGNOSIS — I4819 Other persistent atrial fibrillation: Secondary | ICD-10-CM | POA: Diagnosis not present

## 2020-07-06 MED ORDER — METOPROLOL TARTRATE 50 MG PO TABS
50.0000 mg | ORAL_TABLET | Freq: Every day | ORAL | 6 refills | Status: DC | PRN
Start: 2020-07-06 — End: 2022-02-25

## 2020-07-06 MED ORDER — METOPROLOL SUCCINATE ER 25 MG PO TB24: 25.0000 mg | ORAL_TABLET | Freq: Two times a day (BID) | ORAL | 6 refills | Status: DC

## 2020-07-06 MED ORDER — APIXABAN 5 MG PO TABS
5.0000 mg | ORAL_TABLET | Freq: Two times a day (BID) | ORAL | 6 refills | Status: DC
Start: 2020-07-06 — End: 2021-05-17

## 2020-07-06 MED ORDER — FLECAINIDE ACETATE 150 MG PO TABS: 150.0000 mg | ORAL_TABLET | Freq: Two times a day (BID) | ORAL | 6 refills | Status: DC

## 2020-07-06 NOTE — Patient Instructions (Signed)
Medication Instructions:  Your physician recommends that you continue on your current medications as directed. Please refer to the Current Medication list given to you today.  *If you need a refill on your cardiac medications before your next appointment, please call your pharmacy*   Lab Work: None ordered   Testing/Procedures: None ordered   Follow-Up: At [facility], you and your health needs are our priority.  As part of our continuing mission to provide you with exceptional heart care, we have created designated Provider Care Teams.  These Care Teams include your primary Cardiologist (physician) and Advanced Practice Providers (APPs -  Physician Assistants and Nurse Practitioners) who all work together to provide you with the care you need, when you need it.  We recommend signing up for the patient portal called "MyChart".  Sign up information is provided on this After Visit Summary.  MyChart is used to connect with patients for Virtual Visits (Telemedicine).  Patients are able to view lab/test results, encounter notes, upcoming appointments, etc.  Non-urgent messages can be sent to your provider as well.   To learn more about what you can do with MyChart, go to [email or web address].    Your next appointment:   6 month(s)  The format for your next appointment:   In Person  Provider:   Will Camnitz, MD   Thank you for choosing [facility]!!   Catalaya Garr, RN [phone]     

## 2020-07-06 NOTE — Progress Notes (Signed)
Electrophysiology Office Note   Date:  07/06/2020   ID:  Sandra Stark, Sandra Stark Oct 25, 1965, MRN [id number]  PCP:  Raina Mina., MD  Cardiologist:  Aundra Dubin Primary Electrophysiologist:  Will Meredith Leeds, MD    No chief complaint on file.    History of Present Illness: Sandra Stark is a 55 y.o. female who is being seen today for the evaluation of atrial flutter/fibrillation at the request of Raina Mina., MD. Presenting today for electrophysiology evaluation.    She has a history significant for atrial flutter, atrial fibrillation, atrial tachycardia, and sleep apnea.  She also has morbid obesity.  She had an echo in 2014 that showed normal LV function and was put on flecainide.  Stress testing showed anterolateral ischemia.  Left heart catheterization showed normal coronary arteries.  She has had more frequent episodes of atrial fibrillation and her flecainide dose has been increased to 150 mg.  She had a cardioversion 03/31/2020.  Today, denies symptoms of palpitations, chest pain, shortness of breath, orthopnea, PND, lower extremity edema, claudication, dizziness, presyncope, syncope, bleeding, or neurologic sequela. The patient is tolerating medications without difficulties.  Since her cardioversion she has done well.  She has had one short episode of atrial fibrillation, but otherwise has no major complaints.   Past Medical History:  Diagnosis Date  . Atrial tachycardia ([place])    a. Dx 03/2013.  . Chest pain    a. 03/2013: cath negative for CAD, no PE by CT angio.  Marland Kitchen Dysrhythmia   . Hyperglycemia    a. A1c 5.8 in 03/2013.  Marland Kitchen Hyperlipidemia    a. Monitored by PCP - elected to pursue lifestyle modification in 09/2012 with reassessment pending.  Marland Kitchen PAC (premature atrial contraction)    a. Dx 03/2013.  Marland Kitchen PAF (paroxysmal atrial fibrillation) ([place])    a. Dx 03/2013 - started on flecainide. CHADSVASC = 1 so no anticoag at present time.  . Paroxysmal atrial flutter ([place])     a. Dx 03/2013.  . Pulmonary nodule, right    a. 72mm RML by CT angio 03/2013.   Past Surgical History:  Procedure Laterality Date  . APPENDECTOMY    . CARDIOVERSION N/A 08/20/2019   Procedure: CARDIOVERSION;  Surgeon: Lelon Perla, MD;  Location: [facility] ENDOSCOPY;  Service: Cardiovascular;  Laterality: N/A;  . CARDIOVERSION N/A 03/31/2020   Procedure: CARDIOVERSION;  Surgeon: Buford Dresser, MD;  Location: [facility] ENDOSCOPY;  Service: Cardiovascular;  Laterality: N/A;  . CHOLECYSTECTOMY    . HERNIA REPAIR    . LEFT HEART CATHETERIZATION WITH CORONARY ANGIOGRAM N/A 03/28/2013   Procedure: LEFT HEART CATHETERIZATION WITH CORONARY ANGIOGRAM;  Surgeon: Ramond Dial, MD;  Location: [facility] CATH LAB;  Service: Cardiovascular;  Laterality: N/A;  . TEE WITHOUT CARDIOVERSION N/A 08/20/2019   Procedure: TRANSESOPHAGEAL ECHOCARDIOGRAM (TEE);  Surgeon: Lelon Perla, MD;  Location: [facility] ENDOSCOPY;  Service: Cardiovascular;  Laterality: N/A;     Current Outpatient Medications  Medication Sig Dispense Refill  . apixaban (ELIQUIS) 5 MG TABS tablet Take 1 tablet (5 mg total) by mouth 2 (two) times daily. 60 tablet 3  . Ascorbic Acid (VITAMIN C) 1000 MG tablet Take 1,000 mg by mouth every evening.    Marland Kitchen ELDERBERRY PO Take 15 mLs by mouth at bedtime.     . flecainide (TAMBOCOR) 150 MG tablet Take 1 tablet (150 mg total) by mouth 2 (two) times daily. 60 tablet 2  . furosemide (LASIX) 20 MG tablet Take 20 mg by  mouth as needed.    . loratadine (CLARITIN) 10 MG tablet Take 10 mg by mouth at bedtime.     . metoprolol succinate (TOPROL-XL) 25 MG 24 hr tablet Take 1 tablet (25 mg total) by mouth in the morning and at bedtime. 60 tablet 6  . metoprolol tartrate (LOPRESSOR) 50 MG tablet Take 1 tablet (50 mg total) by mouth daily as needed for up to 1 dose (consistent heart greater than 120 (longer than an hour at rest)). 60 tablet 0  . Multiple Vitamins-Minerals (MULTIVITAMIN WITH MINERALS) tablet Take 1 tablet  by mouth daily.    . pantoprazole (PROTONIX) 40 MG tablet Take 40 mg by mouth daily.     No current facility-administered medications for this visit.    Allergies:   Patient has no known allergies.   Social History:  The patient  reports that she has never smoked. She has never used smokeless tobacco. She reports current alcohol use. She reports that she does not use drugs.   Family History:  The patient's family history includes Breast cancer in her sister; Colon cancer in her mother; Colon polyps in her sister; Diabetes in her paternal grandfather and paternal grandmother; Heart disease in an other family member; Rectal cancer in her mother; Stroke in her paternal grandfather and paternal grandmother.    ROS:  Please see the history of present illness.   Otherwise, review of systems is positive for none.   All other systems are reviewed and negative.   PHYSICAL EXAM: VS:  BP 124/74   Pulse 66   Ht 5\' 4"  (1.626 m)   Wt (!) 312 lb 9.6 oz (141.8 kg)   SpO2 98%   BMI 53.66 kg/m  , BMI Body mass index is 53.66 kg/m. GEN: Well nourished, well developed, in no acute distress  HEENT: normal  Neck: no JVD, carotid bruits, or masses Cardiac: RRR; no murmurs, rubs, or gallops,no edema  Respiratory:  clear to auscultation bilaterally, normal work of breathing GI: soft, nontender, nondistended, + BS MS: no deformity or atrophy  Skin: warm and dry Neuro:  Strength and sensation are intact Psych: euthymic mood, full affect  EKG:  EKG is ordered today. Personal review of the ekg ordered shows sinus rhythm, rate 66  Recent Labs: 08/15/2019: B Natriuretic Peptide 290.9 08/26/2019: ALT 25; Magnesium 2.0 03/25/2020: BUN 11; Creatinine, Ser 0.92; Hemoglobin 14.5; Platelets 227; Potassium 4.1; Sodium 138    Lipid Panel     Component Value Date/Time   CHOL 152 12/05/2014 1216   TRIG 203.0 (H) 12/05/2014 1216   HDL 32.30 (L) 12/05/2014 1216   CHOLHDL 5 12/05/2014 1216   VLDL 40.6 (H)  12/05/2014 1216   LDLCALC 72 10/07/2014 0838   LDLDIRECT 87.0 12/05/2014 1216     Wt Readings from Last 3 Encounters:  07/06/20 (!) 312 lb 9.6 oz (141.8 kg)  04/02/20 (!) 307 lb (139.3 kg)  03/31/20 (!) 308 lb 10.3 oz (140 kg)      Other studies Reviewed: Additional studies/ records that were reviewed today include: TTE 2014  Review of the above records today demonstrates:  - Left ventricle: Technically limited study. The cavity size was normal. Wall thickness was increased in a pattern of mild LVH. The estimated ejection fraction was 60%. Wall motion was normal; there were no regional wall motion abnormalities. - Pulmonary arteries: PA peak pressure: 57mm Hg (S).   ASSESSMENT AND PLAN:  1.  Persistent atrial fibrillation/atrial flutter/atrial tachycardia: Currently on flecainide, Eliquis, Toprol.  High risk medication monitoring.  Takes Lopressor as needed.  Status post cardioversion 03/31/2020.  CHA2DS2-VASc of 1.  She remains in sinus rhythm.  No changes.   2.  Snoring: Has had a negative sleep study in the past  3.  Morbid obesity: Weight loss encouraged.  She would like to try to lose weight on her own, but would potentially be amenable to weight loss clinic.   Current medicines are reviewed at length with the patient today.   The patient does not have concerns regarding her medicines.  The following changes were made today: None  Labs/ tests ordered today include:  Orders Placed This Encounter  Procedures  . EKG 12-Lead     Disposition:   FU with Will Camnitz 6 months  Signed, Will Meredith Leeds, MD  07/06/2020 11:04 AM     [facility] [address] [place] [address] [phone] (office) [phone] (fax)

## 2020-07-06 NOTE — Addendum Note (Signed)
Addended by: Stanton Kidney on: 07/06/2020 03:50 PM   Modules accepted: Orders

## 2020-08-28 ENCOUNTER — Telehealth: Payer: Self-pay | Admitting: Cardiology

## 2020-08-28 NOTE — Telephone Encounter (Signed)
Pt aware ok to take per Dr. Curt Bears. She appreciates the approval and call back.

## 2020-08-28 NOTE — Telephone Encounter (Signed)
Pt c/o medication issue:  1. Name of Medication: diclofenac sodium gel   2. How are you currently taking this medication (dosage and times per day)? Has not started  3. Are you having a reaction (difficulty breathing--STAT)? no  4. What is your medication issue? Patient states she was given the gel for her knee by her doctor at [facility]. She would like to know if it is okay for her to start.

## 2020-09-28 ENCOUNTER — Other Ambulatory Visit: Payer: Self-pay | Admitting: Cardiology

## 2020-09-28 DIAGNOSIS — I480 Paroxysmal atrial fibrillation: Secondary | ICD-10-CM

## 2020-10-01 ENCOUNTER — Ambulatory Visit (HOSPITAL_COMMUNITY): Payer: Medicaid Other | Admitting: Physician Assistant

## 2020-11-06 ENCOUNTER — Telehealth: Payer: Self-pay | Admitting: Cardiology

## 2020-11-06 NOTE — Telephone Encounter (Signed)
Pt calling today to report she was dx with COVID on Wed. Her PCP prescribed her molnupiravir 200 mg 4 capsules every 4 hours. Her pharmacist checked drug interactions and found it was safe for her to take. She is concerned about the mucinex DM and robitussin. I advised her against the mucinex DM. Found plain mucinex and robitussin had no drug-to-drug interactions and safe for her to take per [facility].   She appreciated the call and had no additional questions.

## 2020-11-06 NOTE — Telephone Encounter (Signed)
Pt was diagnosed with Covid Wednesday, she was given a script for molnupiravir 200 mg 4 capsules every 4 hours. Pt was informed by the pharmacist to take Musinex DM and Robitussin but pt is not sure if she should take those meds. Please advise pt  of which meds she should be taking.

## 2021-01-04 ENCOUNTER — Ambulatory Visit: Payer: Medicaid Other | Admitting: Cardiology

## 2021-01-04 ENCOUNTER — Other Ambulatory Visit: Payer: Self-pay | Admitting: *Deleted

## 2021-01-04 DIAGNOSIS — I480 Paroxysmal atrial fibrillation: Secondary | ICD-10-CM

## 2021-01-04 MED ORDER — METOPROLOL SUCCINATE ER 25 MG PO TB24: 25.0000 mg | ORAL_TABLET | Freq: Two times a day (BID) | ORAL | 11 refills | Status: DC

## 2021-02-01 ENCOUNTER — Ambulatory Visit (INDEPENDENT_AMBULATORY_CARE_PROVIDER_SITE_OTHER): Payer: Medicaid Other | Admitting: Cardiology

## 2021-02-01 ENCOUNTER — Encounter: Payer: Self-pay | Admitting: Cardiology

## 2021-02-01 ENCOUNTER — Other Ambulatory Visit: Payer: Self-pay

## 2021-02-01 VITALS — BP 128/80 | HR 72 | Ht 65.0 in | Wt 301.8 lb

## 2021-02-01 DIAGNOSIS — I480 Paroxysmal atrial fibrillation: Secondary | ICD-10-CM | POA: Diagnosis not present

## 2021-02-01 NOTE — Progress Notes (Signed)
Electrophysiology Office Note   Date:  02/01/2021   ID:  Sandra Stark, DOB 1965/07/03, MRN [id number]  PCP:  Raina Mina., MD  Cardiologist:  Aundra Dubin Primary Electrophysiologist:  Anushri Casalino Meredith Leeds, MD    No chief complaint on file.    History of Present Illness: Sandra Stark is a 55 y.o. female who is being seen today for the evaluation of atrial flutter/fibrillation at the request of Raina Mina., MD. Presenting today for electrophysiology evaluation.    She has a history significant atrial fibrillation, atrial flutter, atrial tachycardia, and sleep apnea.  She also has morbid obesity.  Echo in 2014 showed normal LV systolic function and she was started on flecainide.  Stress testing showed inferolateral ischemia and she had a catheterization that showed normal coronary arteries.  She has had more frequent episodes of atrial fibrillation and flecainide has been increased.  She had cardioversion 03/31/2020.  Today, denies symptoms of palpitations, chest pain, shortness of breath, orthopnea, PND, lower extremity edema, claudication, dizziness, presyncope, syncope, bleeding, or neurologic sequela. The patient is tolerating medications without difficulties.  Since being seen she has done well.  She has had no chest pain or shortness of breath.  She is unaware of further episodes of atrial fibrillation or atrial flutter.  She is overall comfortable with her control.  She has no major complaints today.   Past Medical History:  Diagnosis Date   Atrial tachycardia ([place])    a. Dx 03/2013.   Chest pain    a. 03/2013: cath negative for CAD, no PE by CT angio.   Dysrhythmia    Hyperglycemia    a. A1c 5.8 in 03/2013.   Hyperlipidemia    a. Monitored by PCP - elected to pursue lifestyle modification in 09/2012 with reassessment pending.   PAC (premature atrial contraction)    a. Dx 03/2013.   PAF (paroxysmal atrial fibrillation) ([place])    a. Dx 03/2013 - started on flecainide.  CHADSVASC = 1 so no anticoag at present time.   Paroxysmal atrial flutter ([place])    a. Dx 03/2013.   Pulmonary nodule, right    a. 37mm RML by CT angio 03/2013.   Past Surgical History:  Procedure Laterality Date   APPENDECTOMY     CARDIOVERSION N/A 08/20/2019   Procedure: CARDIOVERSION;  Surgeon: Lelon Perla, MD;  Location: [facility] ENDOSCOPY;  Service: Cardiovascular;  Laterality: N/A;   CARDIOVERSION N/A 03/31/2020   Procedure: CARDIOVERSION;  Surgeon: Buford Dresser, MD;  Location: [facility] ENDOSCOPY;  Service: Cardiovascular;  Laterality: N/A;   CHOLECYSTECTOMY     HERNIA REPAIR     LEFT HEART CATHETERIZATION WITH CORONARY ANGIOGRAM N/A 03/28/2013   Procedure: LEFT HEART CATHETERIZATION WITH CORONARY ANGIOGRAM;  Surgeon: Ramond Dial, MD;  Location: [facility] CATH LAB;  Service: Cardiovascular;  Laterality: N/A;   TEE WITHOUT CARDIOVERSION N/A 08/20/2019   Procedure: TRANSESOPHAGEAL ECHOCARDIOGRAM (TEE);  Surgeon: Lelon Perla, MD;  Location: [facility] ENDOSCOPY;  Service: Cardiovascular;  Laterality: N/A;     Current Outpatient Medications  Medication Sig Dispense Refill   apixaban (ELIQUIS) 5 MG TABS tablet Take 1 tablet (5 mg total) by mouth 2 (two) times daily. 60 tablet 6   Ascorbic Acid (VITAMIN C) 1000 MG tablet Take 1,000 mg by mouth every evening.     ELDERBERRY PO Take 15 mLs by mouth at bedtime.      flecainide (TAMBOCOR) 150 MG tablet Take 1 tablet (150 mg total) by mouth 2 (two)  times daily. 60 tablet 6   furosemide (LASIX) 20 MG tablet Take 20 mg by mouth as needed.     loratadine (CLARITIN) 10 MG tablet Take 10 mg by mouth at bedtime.      metoprolol succinate (TOPROL-XL) 25 MG 24 hr tablet Take 1 tablet (25 mg total) by mouth in the morning and at bedtime. 60 tablet 11   metoprolol tartrate (LOPRESSOR) 50 MG tablet Take 1 tablet (50 mg total) by mouth daily as needed (consistent heart greater than 120 (longer than an hour at rest)). 60 tablet 6   Multiple  Vitamins-Minerals (MULTIVITAMIN WITH MINERALS) tablet Take 1 tablet by mouth daily.     pantoprazole (PROTONIX) 40 MG tablet Take 40 mg by mouth daily.     No current facility-administered medications for this visit.    Allergies:   Patient has no known allergies.   Social History:  The patient  reports that she has never smoked. She has never used smokeless tobacco. She reports current alcohol use. She reports that she does not use drugs.   Family History:  The patient's family history includes Breast cancer in her sister; Colon cancer in her mother; Colon polyps in her sister; Diabetes in her paternal grandfather and paternal grandmother; Heart disease in an other family member; Rectal cancer in her mother; Stroke in her paternal grandfather and paternal grandmother.   ROS:  Please see the history of present illness.   Otherwise, review of systems is positive for none.   All other systems are reviewed and negative.   PHYSICAL EXAM: VS:  BP 128/80   Pulse 72   Ht 5\' 5"  (1.651 m)   Wt (!) 301 lb 12.8 oz (136.9 kg)   SpO2 97%   BMI 50.22 kg/m  , BMI Body mass index is 50.22 kg/m. GEN: Well nourished, well developed, in no acute distress  HEENT: normal  Neck: no JVD, carotid bruits, or masses Cardiac: RRR; no murmurs, rubs, or gallops,no edema  Respiratory:  clear to auscultation bilaterally, normal work of breathing GI: soft, nontender, nondistended, + BS MS: no deformity or atrophy  Skin: warm and dry Neuro:  Strength and sensation are intact Psych: euthymic mood, full affect  EKG:  EKG is ordered today. Personal review of the ekg ordered shows sinus rhythm, rate 72  Recent Labs: 03/25/2020: BUN 11; Creatinine, Ser 0.92; Hemoglobin 14.5; Platelets 227; Potassium 4.1; Sodium 138    Lipid Panel     Component Value Date/Time   CHOL 152 12/05/2014 1216   TRIG 203.0 (H) 12/05/2014 1216   HDL 32.30 (L) 12/05/2014 1216   CHOLHDL 5 12/05/2014 1216   VLDL 40.6 (H) 12/05/2014  1216   LDLCALC 72 10/07/2014 0838   LDLDIRECT 87.0 12/05/2014 1216     Wt Readings from Last 3 Encounters:  02/01/21 (!) 301 lb 12.8 oz (136.9 kg)  07/06/20 (!) 312 lb 9.6 oz (141.8 kg)  04/02/20 (!) 307 lb (139.3 kg)      Other studies Reviewed: Additional studies/ records that were reviewed today include: TTE 2014  Review of the above records today demonstrates:  - Left ventricle: Technically limited study. The cavity size   was normal. Wall thickness was increased in a pattern of   mild LVH. The estimated ejection fraction was 60%. Wall   motion was normal; there were no regional wall motion   abnormalities. - Pulmonary arteries: PA peak pressure: 24mm Hg (S).   ASSESSMENT AND PLAN:  1.  Persistent atrial  fibrillation/flutter/atrial tachycardia: Currently on flecainide 150 mg twice daily, Toprol-XL 25 mg daily, Eliquis 5 mg twice daily.  Despite the high dose of flecainide, her QRS remains narrow.  High risk medication monitoring.  CHA2DS2-VASc of 1.  She is remained in sinus rhythm.  She has had no further episodes.  Continue with current management.  2.  Morbid obesity: Diet and weight loss encouraged.,   Current medicines are reviewed at length with the patient today.   The patient does not have concerns regarding her medicines.  The following changes were made today: None  Labs/ tests ordered today include:  Orders Placed This Encounter  Procedures   EKG 12-Lead      Disposition:   FU with Pericles Carmicheal 6 months  Signed, Raiden Haydu Meredith Leeds, MD  02/01/2021 2:23 PM     [address] [place] [address] [phone] (office) [phone] (fax)

## 2021-02-04 ENCOUNTER — Other Ambulatory Visit: Payer: Self-pay | Admitting: Cardiology

## 2021-02-04 DIAGNOSIS — I4892 Unspecified atrial flutter: Secondary | ICD-10-CM

## 2021-03-01 ENCOUNTER — Ambulatory Visit: Payer: Medicaid Other | Admitting: Cardiology

## 2021-03-23 ENCOUNTER — Other Ambulatory Visit: Payer: Self-pay | Admitting: Family Medicine

## 2021-03-23 DIAGNOSIS — Z1231 Encounter for screening mammogram for malignant neoplasm of breast: Secondary | ICD-10-CM

## 2021-05-10 ENCOUNTER — Ambulatory Visit
Admission: RE | Admit: 2021-05-10 | Discharge: 2021-05-10 | Disposition: A | Discharge status: Home / Self Care | Payer: Medicaid Other | Source: Ambulatory Visit | Attending: Family Medicine | Admitting: Family Medicine

## 2021-05-10 DIAGNOSIS — Z1231 Encounter for screening mammogram for malignant neoplasm of breast: Secondary | ICD-10-CM

## 2021-05-17 ENCOUNTER — Other Ambulatory Visit: Payer: Self-pay | Admitting: Cardiology

## 2021-05-17 NOTE — Telephone Encounter (Signed)
Eliquis 5 mg refill request received. Patient is 56 years old, weight-136.9 kg, Crea- 0.92 on 03/10/21 Diagnosis- PAF, and last seen by Dr. Curt Bears on 02/01/21. Dose is appropriate based on dosing criteria. Will send in refill to requested pharmacy.

## 2021-05-19 DIAGNOSIS — C55 Malignant neoplasm of uterus, part unspecified: Secondary | ICD-10-CM

## 2021-05-19 HISTORY — DX: Malignant neoplasm of uterus, part unspecified: C55

## 2021-05-25 ENCOUNTER — Telehealth: Payer: Self-pay | Admitting: Cardiology

## 2021-05-25 NOTE — Telephone Encounter (Signed)
Covering preop today. Not outlined in chart but anticipate they'll need to hold Eliquis for surgery requested. Will route to pharm then pt will need call. Last OV 01/2021.

## 2021-05-25 NOTE — Telephone Encounter (Signed)
° °  Pre-operative Risk Assessment    Patient Name: Sandra Stark  DOB: 1965-11-23 MRN: [id number]      Request for Surgical Clearance    Procedure:   Total robotic hysterectomy with bilateral oophorectomy    Date of Surgery:  Clearance 06/10/21                                 Surgeon: DR. Maralyn Sago Surgeon's Group or Practice Name:  [facility]  Phone number:  [phone] Fax number:  [phone]   Type of Clearance Requested:   - Medical    Type of Anesthesia:   yes, unsure of what kind   Additional requests/questions:    SignedMilbert Coulter   05/25/2021, 11:29 AM

## 2021-05-25 NOTE — Telephone Encounter (Signed)
Patient with diagnosis of afib on Eliquis for anticoagulation.    Procedure: Total robotic hysterectomy with bilateral oophorectomy   Date of procedure: 06/10/21  CHA2DS2-VASc Score = 1  This indicates a 0.6% annual risk of stroke. The patient's score is based upon: CHF History: 0 HTN History: 0 Diabetes History: 0 Stroke History: 0 Vascular Disease History: 0 Age Score: 0 Gender Score: 1     CrCl 101mL/min using adjusted body weight Platelet count 254K  Per office protocol, patient can hold Eliquis for 2-3 days prior to procedure.

## 2021-05-26 NOTE — Telephone Encounter (Signed)
° °  Name: Sandra Stark  DOB: [birth year]  MRN: [id number]   Primary Cardiologist: Constance Haw, MD  Chart reviewed as part of pre-operative protocol coverage. Patient was contacted 05/26/2021 in reference to pre-operative risk assessment for pending surgery as outlined below.  Naveen POCAHONTAS COHENOUR was last seen 01/2021 by Dr. Curt Bears. Primarily followed for hx of PAF, a-flutter, and PAT. Cath 2014 negative for CAD. TEE 08/2019 EF 55-60%, normal RV, mild MR. RCRI calculated at 0.4% indicating low CV risk. I reached out to patient for update on how she is doing. The patient affirms she has been doing well without any new cardiac symptoms. Able to exert >8 METS with housework, yardwork, walking, stairs, without any dyspnea or chest pain. Therefore, based on ACC/AHA guidelines, the patient would be at acceptable risk for the planned procedure without further cardiovascular testing. The patient was advised that if she develops new symptoms prior to surgery to contact our office to arrange for a follow-up visit, and she verbalized understanding.  Regarding anticoagulation, per office protocol, patient can hold Eliquis for 2-3 days prior to procedure. We typically advise that blood thinners be resumed when felt safe by performing physician.  I will route this recommendation to the requesting party via Epic fax function and remove from pre-op pool. Please call with questions.  Charlie Pitter, PA-C 05/26/2021, 9:39 AM

## 2021-06-10 HISTORY — PX: RADICAL HYSTERECTOMY: SHX2283

## 2021-07-24 ENCOUNTER — Other Ambulatory Visit: Payer: Self-pay | Admitting: Physician Assistant

## 2021-07-24 ENCOUNTER — Other Ambulatory Visit: Payer: Self-pay | Admitting: Cardiology

## 2021-07-24 DIAGNOSIS — I480 Paroxysmal atrial fibrillation: Secondary | ICD-10-CM

## 2021-07-24 MED ORDER — METOPROLOL SUCCINATE ER 25 MG PO TB24: 25.0000 mg | ORAL_TABLET | Freq: Two times a day (BID) | ORAL | 3 refills | Status: DC

## 2021-08-09 ENCOUNTER — Ambulatory Visit (INDEPENDENT_AMBULATORY_CARE_PROVIDER_SITE_OTHER): Payer: Medicaid Other | Admitting: Cardiology

## 2021-08-09 ENCOUNTER — Encounter: Payer: Self-pay | Admitting: Cardiology

## 2021-08-09 VITALS — BP 122/82 | HR 76 | Ht 65.0 in | Wt 327.8 lb

## 2021-08-09 DIAGNOSIS — R0602 Shortness of breath: Secondary | ICD-10-CM

## 2021-08-09 DIAGNOSIS — I4892 Unspecified atrial flutter: Secondary | ICD-10-CM

## 2021-08-09 DIAGNOSIS — I4819 Other persistent atrial fibrillation: Secondary | ICD-10-CM

## 2021-08-09 MED ORDER — FUROSEMIDE 20 MG PO TABS: 20.0000 mg | ORAL_TABLET | Freq: Every day | ORAL | 3 refills | Status: DC | PRN

## 2021-08-09 MED ORDER — APIXABAN 5 MG PO TABS: 5.0000 mg | ORAL_TABLET | Freq: Two times a day (BID) | ORAL | 3 refills | Status: DC

## 2021-08-09 MED ORDER — METOPROLOL SUCCINATE ER 50 MG PO TB24: ORAL_TABLET | ORAL | 3 refills | Status: DC

## 2021-08-09 MED ORDER — PANTOPRAZOLE SODIUM 40 MG PO TBEC: 40.0000 mg | DELAYED_RELEASE_TABLET | Freq: Every day | ORAL | 3 refills | Status: AC

## 2021-08-09 NOTE — Progress Notes (Signed)
? ?Electrophysiology Office Note ? ? ?Date:  08/09/2021  ? ?ID:  Sandra Stark, DOB 23-Sep-1965, MRN [id number] ? ?PCP:  Raina Mina., MD  ?Cardiologist:  Aundra Dubin ?Primary Electrophysiologist:  Nilah Belcourt Meredith Leeds, MD   ? ?No chief complaint on file. ? ? ?  ?History of Present Illness: ?Sandra Stark is a 56 y.o. female who is being seen today for the evaluation of atrial flutter/fibrillation at the request of Raina Mina., MD. Presenting today for electrophysiology evaluation.   ? ?She has a history significant for atrial fibrillation, atrial flutter, atrial tachycardia, sleep apnea, morbid obesity.  She had an echo in 2014 that showed normal LV systolic function and no started on flecainide.  Stress testing showed inferolateral ischemia but on catheterization she was found to have normal coronary arteries.  She had more frequent episodes of atrial fibrillation and flecainide was increased.  She had cardioversion 03/31/2020. ? ?Today, denies symptoms of palpitations, chest pain,  orthopnea, PND, lower extremity edema, claudication, dizziness, presyncope, syncope, bleeding, or neurologic sequela. The patient is tolerating medications without difficulties.  Fortunately her QRS has gotten wide.  She is on high dose of flecainide.  Due to that we Bronte Sabado stop her flecainide today.  She is also been short of breath.  She has shortness of breath that occurred 2 weeks after her abdominal hysterectomy for uterine cancer.  She has been on Eliquis since that time.  She is short of breath when she walks short distances.  She has not walked up the stairs since becoming short of breath. ? ? ?Past Medical History:  ?Diagnosis Date  ? Atrial tachycardia ([place])   ? a. Dx 03/2013.  ? Chest pain   ? a. 03/2013: cath negative for CAD, no PE by CT angio.  ? Dysrhythmia   ? Hyperglycemia   ? a. A1c 5.8 in 03/2013.  ? Hyperlipidemia   ? a. Monitored by PCP - elected to pursue lifestyle modification in 09/2012 with reassessment  pending.  ? PAC (premature atrial contraction)   ? a. Dx 03/2013.  ? PAF (paroxysmal atrial fibrillation) ([place])   ? a. Dx 03/2013 - started on flecainide. CHADSVASC = 1 so no anticoag at present time.  ? Paroxysmal atrial flutter ([place])   ? a. Dx 03/2013.  ? Pulmonary nodule, right   ? a. 59m RML by CT angio 03/2013.  ? ?Past Surgical History:  ?Procedure Laterality Date  ? APPENDECTOMY    ? CARDIOVERSION N/A 08/20/2019  ? Procedure: CARDIOVERSION;  Surgeon: CLelon Perla MD;  [place] [facility]  Service: Cardiovascular;  Laterality: N/A;  ? CARDIOVERSION N/A 03/31/2020  ? Procedure: CARDIOVERSION;  Surgeon: CBuford Dresser MD;  [facility];  Service: Cardiovascular;  Laterality: N/A;  ? CHOLECYSTECTOMY    ? HERNIA REPAIR    ? LEFT HEART CATHETERIZATION WITH CORONARY ANGIOGRAM N/A 03/28/2013  ? Procedure: LEFT HEART CATHETERIZATION WITH CORONARY ANGIOGRAM;  Surgeon: JRamond Dial MD;  [facility];  Service: Cardiovascular;  Laterality: N/A;  ? TEE WITHOUT CARDIOVERSION N/A 08/20/2019  ? Procedure: TRANSESOPHAGEAL ECHOCARDIOGRAM (TEE);  Surgeon: CLelon Perla MD;  [facility];  Service: Cardiovascular;  Laterality: N/A;  ? ? ? ?Current Outpatient Medications  ?Medication Sig Dispense Refill  ? Ascorbic Acid (VITAMIN C) 1000 MG tablet Take 1,000 mg by mouth every evening.    ? ELDERBERRY PO Take 15 mLs by mouth at bedtime.     ? flecainide (TAMBOCOR) 150 MG tablet  TAKE 1 TABLET BY MOUTH TWICE A DAY 180 tablet 2  ? loratadine (CLARITIN) 10 MG tablet Take 10 mg by mouth at bedtime.     ? metoprolol tartrate (LOPRESSOR) 50 MG tablet Take 1 tablet (50 mg total) by mouth daily as needed (consistent heart greater than 120 (longer than an hour at rest)). 60 tablet 6  ? Multiple Vitamins-Minerals (MULTIVITAMIN WITH MINERALS) tablet Take 1 tablet by mouth daily.    ? sulfamethoxazole-trimethoprim (BACTRIM DS) 800-160 MG tablet Take 1 tablet by mouth 2 (two) times daily.     ? apixaban (ELIQUIS) 5 MG TABS tablet Take 1 tablet (5 mg total) by mouth 2 (two) times daily. 180 tablet 3  ? furosemide (LASIX) 20 MG tablet Take 1 tablet (20 mg total) by mouth daily as needed for fluid. 30 tablet 3  ? metoprolol succinate (TOPROL-XL) 50 MG 24 hr tablet Take 25 mg by mouth in the morning and at bedtime. Take with or immediately following a meal. 90 tablet 3  ? pantoprazole (PROTONIX) 40 MG tablet Take 1 tablet (40 mg total) by mouth daily. 90 tablet 3  ? ?No current facility-administered medications for this visit.  ? ? ?Allergies:   Tape  ? ?Social History:  The patient  reports that she has never smoked. She has never used smokeless tobacco. She reports current alcohol use. She reports that she does not use drugs.  ? ?Family History:  The patient's family history includes Breast cancer in her sister; Colon cancer in her mother; Colon polyps in her sister; Diabetes in her paternal grandfather and paternal grandmother; Heart disease in an other family member; Rectal cancer in her mother; Stroke in her paternal grandfather and paternal grandmother.  ? ?ROS:  Please see the history of present illness.   Otherwise, review of systems is positive for none.   All other systems are reviewed and negative.  ? ?PHYSICAL EXAM: ?VS:  BP 122/82   Pulse 76   Ht '5\' 5"'$  (1.651 m)   Wt (!) 327 lb 12.8 oz (148.7 kg)   LMP 11/02/2019   SpO2 96%   BMI 54.55 kg/m?  , BMI Body mass index is 54.55 kg/m?. ?GEN: Well nourished, well developed, in no acute distress  ?HEENT: normal  ?Neck: no JVD, carotid bruits, or masses ?Cardiac: RRR; no murmurs, rubs, or gallops,no edema  ?Respiratory:  clear to auscultation bilaterally, normal work of breathing ?GI: soft, nontender, nondistended, + BS ?MS: no deformity or atrophy  ?Skin: warm and dry ?Neuro:  Strength and sensation are intact ?Psych: euthymic mood, full affect ? ?EKG:  EKG is ordered today. ?Personal review of the ekg ordered shows sinus rhythm, left bundle  branch block ? ?Recent Labs: ?No results found for requested labs within last 8760 hours.  ? ? ?Lipid Panel  ?   ?Component Value Date/Time  ? CHOL 152 12/05/2014 1216  ? TRIG 203.0 (H) 12/05/2014 1216  ? HDL 32.30 (L) 12/05/2014 1216  ? CHOLHDL 5 12/05/2014 1216  ? VLDL 40.6 (H) 12/05/2014 1216  ? [place] 72 10/07/2014 0838  ? LDLDIRECT 87.0 12/05/2014 1216  ? ? ? ?Wt Readings from Last 3 Encounters:  ?08/09/21 (!) 327 lb 12.8 oz (148.7 kg)  ?02/01/21 (!) 301 lb 12.8 oz (136.9 kg)  ?07/06/20 (!) 312 lb 9.6 oz (141.8 kg)  ?  ? ? ?Other studies Reviewed: ?Additional studies/ records that were reviewed today include: TTE 2014  ?Review of the above records today demonstrates:  ?- Left  ventricle: Technically limited study. The cavity size ?  was normal. Wall thickness was increased in a pattern of ?  mild LVH. The estimated ejection fraction was 60%. Wall ?  motion was normal; there were no regional wall motion ?  abnormalities. ?- Pulmonary arteries: PA peak pressure: 62m Hg (S). ? ? ?ASSESSMENT AND PLAN: ? ?1.  Persistent atrial fibrillation/flutter/atrial tachycardia: Currently on flecainide 150 mg twice daily, Toprol-XL 25 mg daily, Eliquis 5 mg twice daily.  CHA2DS2-VASc of 1.  High risk medication monitoring for flecainide.  Due to her wide QRS, we Javante Nilsson stop the flecainide today.  She is in sinus rhythm.  We Ashunti Schofield have her follow-up in atrial fibrillation clinic to see if her QRS has normalized and discussed alternatives for medication rhythm control. ? ?2.  Morbid obesity: She is complaining of shortness of breath.  Her shortness of breath is likely multifactorial, but morbid obesity is likely contributing.  We Nazir Hacker refer her to [facility] weight loss clinic.  She Jordan Caraveo also think about the possibility of bariatric surgery and can discuss it with atrial fibrillation clinic at her next appointment. ? ?3.  Shortness of breath: Unclear as to the cause other than her morbid obesity.  She does not appear to be volume  overloaded.  Despite that, we Dim Meisinger check a BNP.  If she remains short of breath, we Alura Olveda plan for potentially transthoracic echo.  We Ivon Roedel also repeat her sleep study as she has gained weight over the last few months. ? ?Cu

## 2021-08-09 NOTE — Patient Instructions (Addendum)
Medication Instructions:  ?Your physician has recommended you make the following change in your medication:  ?STOP Flecainide ? ?*If you need a refill on your cardiac medications before your next appointment, please call your pharmacy* ? ? ?Lab Work: ?Today: BNP ? ?If you have labs (blood work) drawn today and your tests are completely normal, you will receive your results only by: ?MyChart Message (if you have MyChart) OR ?A paper copy in the mail ?If you have any lab test that is abnormal or we need to change your treatment, we will call you to review the results. ? ? ?Testing/Procedures: ?Your physician has recommended that you have a sleep study. This test records several body functions during sleep, including: brain activity, eye movement, oxygen and carbon dioxide blood levels, heart rate and rhythm, breathing rate and rhythm, the flow of air through your mouth and nose, snoring, body muscle movements, and chest and belly movement. ? ? ?Follow-Up: ?At [facility], you and your health needs are our priority.  As part of our continuing mission to provide you with exceptional heart care, we have created designated Provider Care Teams.  These Care Teams include your primary Cardiologist (physician) and Advanced Practice Providers (APPs -  Physician Assistants and Nurse Practitioners) who all work together to provide you with the care you need, when you need it. ? ?Your next appointment:   ?2 week(s) ? ?The format for your next appointment:   ?In Person ? ?Provider:   ?You will follow up in the [facility] located at [facility]. ?Your provider will be: ?Roderic Palau, NP or Clint R. Fenton, PA-C ? ?You have been referred to weight loss management clinic ? ? ?Thank you for choosing [facility]!! ? ? ?Trinidad Curet, RN ?[phone] ? ? ?Other Instructions ? ?  ?

## 2021-08-09 NOTE — Addendum Note (Signed)
Addended by: Stanton Kidney on: 08/09/2021 01:07 PM ? ? Modules accepted: Orders ? ?

## 2021-08-10 LAB — PRO B NATRIURETIC PEPTIDE: NT-Pro BNP: <36 pg/mL (ref 0–287)

## 2021-08-22 ENCOUNTER — Emergency Department (HOSPITAL_COMMUNITY)
Admission: EM | Admit: 2021-08-22 | Discharge: 2021-08-22 | Disposition: A | Discharge status: Home / Self Care | Payer: Medicaid Other | Attending: Emergency Medicine | Admitting: Emergency Medicine

## 2021-08-22 ENCOUNTER — Emergency Department (HOSPITAL_COMMUNITY): Payer: Medicaid Other

## 2021-08-22 ENCOUNTER — Encounter (HOSPITAL_COMMUNITY): Payer: Self-pay

## 2021-08-22 DIAGNOSIS — Z90710 Acquired absence of both cervix and uterus: Secondary | ICD-10-CM | POA: Diagnosis not present

## 2021-08-22 DIAGNOSIS — I4891 Unspecified atrial fibrillation: Secondary | ICD-10-CM | POA: Insufficient documentation

## 2021-08-22 DIAGNOSIS — Z7901 Long term (current) use of anticoagulants: Secondary | ICD-10-CM | POA: Insufficient documentation

## 2021-08-22 DIAGNOSIS — R002 Palpitations: Secondary | ICD-10-CM | POA: Diagnosis present

## 2021-08-22 LAB — CBC WITH DIFFERENTIAL/PLATELET
Abs Immature Granulocytes: 0.02 10*3/uL (ref 0.00–0.07)
Basophils Absolute: 0 10*3/uL (ref 0.0–0.1)
Basophils Relative: 1 %
Eosinophils Absolute: 0.1 10*3/uL (ref 0.0–0.5)
Eosinophils Relative: 1 %
HCT: 39.5 % (ref 36.0–46.0)
Hemoglobin: 12.9 g/dL (ref 12.0–15.0)
Immature Granulocytes: 0 %
Lymphocytes Relative: 30 %
Lymphs Abs: 1.8 10*3/uL (ref 0.7–4.0)
MCH: 30.1 pg (ref 26.0–34.0)
MCHC: 32.7 g/dL (ref 30.0–36.0)
MCV: 92.1 fL (ref 80.0–100.0)
Monocytes Absolute: 0.6 10*3/uL (ref 0.1–1.0)
Monocytes Relative: 9 %
Neutro Abs: 3.5 10*3/uL (ref 1.7–7.7)
Neutrophils Relative %: 59 %
Platelets: 220 10*3/uL (ref 150–400)
RBC: 4.29 MIL/uL (ref 3.87–5.11)
RDW: 14.6 % (ref 11.5–15.5)
WBC: 6 10*3/uL (ref 4.0–10.5)
nRBC: 0 % (ref 0.0–0.2)

## 2021-08-22 LAB — URINALYSIS, ROUTINE W REFLEX MICROSCOPIC
Bilirubin Urine: NEGATIVE
Glucose, UA: NEGATIVE mg/dL
Hgb urine dipstick: NEGATIVE
Ketones, ur: NEGATIVE mg/dL
Leukocytes,Ua: NEGATIVE
Nitrite: NEGATIVE
Protein, ur: NEGATIVE mg/dL
Specific Gravity, Urine: 1.018 (ref 1.005–1.030)
pH: 6 (ref 5.0–8.0)

## 2021-08-22 LAB — COMPREHENSIVE METABOLIC PANEL
ALT: 23 U/L (ref 0–44)
AST: 26 U/L (ref 15–41)
Albumin: 3.3 g/dL — ABNORMAL LOW (ref 3.5–5.0)
Alkaline Phosphatase: 97 U/L (ref 38–126)
Anion gap: 10 (ref 5–15)
BUN: 12 mg/dL (ref 6–20)
CO2: 22 mmol/L (ref 22–32)
Calcium: 8.9 mg/dL (ref 8.9–10.3)
Chloride: 108 mmol/L (ref 98–111)
Creatinine, Ser: 1.03 mg/dL — ABNORMAL HIGH (ref 0.44–1.00)
GFR, Estimated: >60 mL/min (ref >60)
Glucose, Bld: 193 mg/dL — ABNORMAL HIGH (ref 70–99)
Potassium: 3.7 mmol/L (ref 3.5–5.1)
Sodium: 140 mmol/L (ref 135–145)
Total Bilirubin: 0.5 mg/dL (ref 0.3–1.2)
Total Protein: 6.2 g/dL — ABNORMAL LOW (ref 6.5–8.1)

## 2021-08-22 LAB — TROPONIN I (HIGH SENSITIVITY)
Troponin I (High Sensitivity): 5 ng/L (ref <18)
Troponin I (High Sensitivity): 6 ng/L (ref <18)

## 2021-08-22 LAB — MAGNESIUM: Magnesium: 2 mg/dL (ref 1.7–2.4)

## 2021-08-22 MED ORDER — ADENOSINE 6 MG/2ML IV SOLN
INTRAVENOUS | Status: AC
  Filled 2021-08-22: qty 4

## 2021-08-22 MED ORDER — METOPROLOL TARTRATE 5 MG/5ML IV SOLN
2.5000 mg | Freq: Once | INTRAVENOUS | Status: AC
  Administered 2021-08-22: 2.5 mg via INTRAVENOUS
  Filled 2021-08-22: qty 5

## 2021-08-22 NOTE — Discharge Instructions (Addendum)
Continue your current medications as prescribed. ?

## 2021-08-22 NOTE — ED Provider Notes (Addendum)
?[facility] ?Provider Note ? ? ?CSN: [id number] ?Arrival date & time: 08/22/21  0326 ? ?  ? ?History ? ?Chief Complaint  ?Patient presents with  ? Irregular Heart Beat  ? ? ?Sandra Stark is a 56 y.o. female. ? ?The history is provided by the patient and medical records.  ?Sandra Stark is a 56 y.o. female who presents to the Emergency Department complaining of palpitations.  She presents the emergency department by EMS for evaluation of palpitations that abruptly woke her up about 45 minutes prior to ED presentation.  She has a history of A-fib and is followed by cardiology.  She takes metoprolol and Eliquis.  A few weeks ago she was taken off of flecainide.  About 3 days ago she noticed that she was starting to have lower extremity edema.  She did start taking a diuretic for that.  She gets chest discomfort with her palpitations but that is typical for her A-fib.  Last episode of A-fib was in March 2022.  In February she did have a total hysterectomy for uterine cancer.  She is thought to be completely clear of cancer at this time.  Since her hysterectomy she has experienced issues with recurrent urinary tract infections.  She does report that she has been feeling slightly unwell and thought she had another urinary tract infection and went to urgent care today.  She did have a dose of Rocephin at urgent care. ? ?EMS gave 20 mg of Cardizem prior to ED arrival. ?  ? ?Home Medications ?Prior to Admission medications   ?Medication Sig Start Date End Date Taking? Authorizing Provider  ?apixaban (ELIQUIS) 5 MG TABS tablet Take 1 tablet (5 mg total) by mouth 2 (two) times daily. 08/09/21   Camnitz, Ocie Doyne, MD  ?Ascorbic Acid (VITAMIN C) 1000 MG tablet Take 1,000 mg by mouth every evening.    [provider]  ?ELDERBERRY PO Take 15 mLs by mouth at bedtime.     [provider]  ?furosemide (LASIX) 20 MG tablet Take 1 tablet (20 mg total) by mouth daily as  needed for fluid. 08/09/21   Camnitz, Ocie Doyne, MD  ?loratadine (CLARITIN) 10 MG tablet Take 10 mg by mouth at bedtime.     [provider]  ?metoprolol succinate (TOPROL-XL) 50 MG 24 hr tablet Take 25 mg by mouth in the morning and at bedtime. Take with or immediately following a meal. 08/09/21   Camnitz, Ocie Doyne, MD  ?metoprolol tartrate (LOPRESSOR) 50 MG tablet Take 1 tablet (50 mg total) by mouth daily as needed (consistent heart greater than 120 (longer than an hour at rest)). 07/06/20   Camnitz, Ocie Doyne, MD  ?Multiple Vitamins-Minerals (MULTIVITAMIN WITH MINERALS) tablet Take 1 tablet by mouth daily.    [provider]  ?pantoprazole (PROTONIX) 40 MG tablet Take 1 tablet (40 mg total) by mouth daily. 08/09/21   Camnitz, Ocie Doyne, MD  ?sulfamethoxazole-trimethoprim (BACTRIM DS) 800-160 MG tablet Take 1 tablet by mouth 2 (two) times daily. 08/04/21   [provider]  ?   ? ?Allergies    ?Tape   ? ?Review of Systems   ?Review of Systems  ?All other systems reviewed and are negative. ? ?Physical Exam ?Updated Vital Signs ?BP 137/69   Pulse 85   Temp 99 ?F (37.2 ?C) (Oral)   Resp 18   LMP 11/02/2019   SpO2 99%  ?Physical Exam ?Vitals and nursing note reviewed.  ?Constitutional:   ?  Appearance: She is well-developed.  ?HENT:  ?   Head: Normocephalic and atraumatic.  ?Cardiovascular:  ?   Rate and Rhythm: Regular rhythm.  ?   Heart sounds: No murmur heard. ?Pulmonary:  ?   Effort: Pulmonary effort is normal. No respiratory distress.  ?   Breath sounds: Normal breath sounds.  ?Abdominal:  ?   Palpations: Abdomen is soft.  ?   Tenderness: There is no abdominal tenderness. There is no guarding or rebound.  ?Musculoskeletal:     ?   General: No tenderness.  ?   Comments: Nonpitting edema to bilateral lower extremities  ?Skin: ?   General: Skin is warm and dry.  ?Neurological:  ?   Mental Status: She is alert and oriented to person, place, and time.  ?Psychiatric:     ?    Behavior: Behavior normal.  ? ? ?ED Results / Procedures / Treatments   ?Labs ?(all labs ordered are listed, but only abnormal results are displayed) ?Labs Reviewed  ?COMPREHENSIVE METABOLIC PANEL - Abnormal; Notable for the following components:  ?    Result Value  ? Glucose, Bld 193 (*)   ? Creatinine, Ser 1.03 (*)   ? Total Protein 6.2 (*)   ? Albumin 3.3 (*)   ? All other components within normal limits  ?URINE CULTURE  ?CBC WITH DIFFERENTIAL/PLATELET  ?URINALYSIS, ROUTINE W REFLEX MICROSCOPIC  ?MAGNESIUM  ?TROPONIN I (HIGH SENSITIVITY)  ?TROPONIN I (HIGH SENSITIVITY)  ? ? ?EKG ?EKG Interpretation ? ?Date/Time:  Sunday Aug 22 2021 03:49:41 EDT ?Ventricular Rate:  109 ?PR Interval:  178 ?QRS Duration: 86 ?QT Interval:  351 ?QTC Calculation: 473 ?R Axis:   -3 ?Text Interpretation: Sinus tachycardia Low voltage, precordial leads Abnormal R-wave progression, early transition Confirmed by Quintella Reichert [phone]) on 08/22/2021 8:32:07 AM ? ?Radiology ?DG Chest Port 1 View ? ?Result Date: 08/22/2021 ?CLINICAL DATA:  56 year old female with chest pain. EXAM: PORTABLE CHEST 1 VIEW COMPARISON:  CTA chest 08/02/2021 and earlier. FINDINGS: Portable AP upright view at 0437 hours. Stable lung volumes. Normal cardiac size and mediastinal contours. Pacing or resuscitation pads project over the left chest. Allowing for portable technique the lungs are clear. No pneumothorax. No acute osseous abnormality identified. Paucity of bowel gas in the upper abdomen. IMPRESSION: No acute cardiopulmonary abnormality. Electronically Signed   By: Genevie Ann M.D.   On: 08/22/2021 05:13   ? ?Procedures ?Procedures  ? ? ?Medications Ordered in ED ?Medications  ?adenosine (ADENOCARD) 6 MG/2ML injection (has no administration in time range)  ?metoprolol tartrate (LOPRESSOR) injection 2.5 mg (2.5 mg Intravenous Given 08/22/21 0402)  ? ? ?ED Course/ Medical Decision Making/ A&P ?  ?                        ?Medical Decision Making ?Amount and/or Complexity  of Data Reviewed ?Labs: ordered. ?Radiology: ordered. ? ?Risk ?Prescription drug management. ? ? ?Pt with hx/o afib currently on metoprolol and eliquis here for evaluation of recurrent afib.  On ED arrival pt in sinus tachycardia, quickly changing to afib with RVR.  She had been treated with diltiazem by EMS prior to ED presentation with initial resolution of her afib.  She was treated in the ED with 2.5 of metoprolol IV.  She was observed in the ED for several hours without recurrent episodes of afib.  Labs without significant electrolyte abnormality.  Trop neg.  Current picture is not c/w PE, acute CHF.  D/w  on  call Cardiologist - plan to continue patient's current home meds with outpt cardiology follow up (has appointment tomorrow). Pt also reports concern for UTI - UA not c/w UTI, will send culture and treat if positive.   ? ? ? ? ?CHA2DS2/VAS Stroke Risk Points  Current as of 31 minutes ago  ?   1 >= 2 Points: High Risk  ?1 - 1.99 Points: Medium Risk  ?0 Points: Low Risk  ?  Last Change: N/A   ?  ? Details   ? This score determines the patient's risk of having a stroke if the  ?patient has atrial fibrillation.  ?  ?  ? Points Metrics  ?0 Has Congestive Heart Failure:  No   ? Current as of 31 minutes ago  ?0 Has Vascular Disease:  No   ? Current as of 31 minutes ago  ?0 Has Hypertension:  No   ? Current as of 31 minutes ago  ?0 Age:  50   ? Current as of 31 minutes ago  ?0 Has Diabetes:  No   ? Current as of 31 minutes ago  ?0 Had Stroke:  No  Had TIA:  No  Had Thromboembolism:  No   ? Current as of 31 minutes ago  ?1 Female:  Yes   ? Current as of 31 minutes ago  ?  ?  ?  ?   ? ? ?Final Clinical Impression(s) / ED Diagnoses ?Final diagnoses:  ?Atrial fibrillation with RVR ([place])  ? ? ?Rx / DC Orders ?ED Discharge Orders   ? ? None  ? ?  ? ? ?  ?Quintella Reichert, MD ?08/22/21 1119 ? ?  ?Quintella Reichert, MD ?08/22/21 1120 ? ?

## 2021-08-22 NOTE — ED Triage Notes (Signed)
Per report pt woke up w/ chest pain and palpitations, pt was cardioverted in march 2022. Pt received '20mg'$  of cardizem and converted to sinus. ?

## 2021-08-23 ENCOUNTER — Inpatient Hospital Stay (HOSPITAL_COMMUNITY): Admission: RE | Admit: 2021-08-23 | Payer: Medicaid Other | Source: Ambulatory Visit | Admitting: Physician Assistant

## 2021-08-23 LAB — URINE CULTURE: Culture: NO GROWTH

## 2021-08-25 ENCOUNTER — Ambulatory Visit (HOSPITAL_COMMUNITY): Payer: Medicaid Other | Admitting: Physician Assistant

## 2021-08-29 NOTE — Progress Notes (Addendum)
? ?Cardiology Office Note ?Date:  08/29/2021  ?Patient ID:  Sandra Stark, DOB 24-Jun-1965, MRN [id number] ?PCP:  Raina Mina., MD  ?Electrophysiologist: Dr. Curt Bears ? ?  ?Chief Complaint:  post ER visit ? ?History of Present Illness: ?Sandra Stark is a 56 y.o. female with history of AFib/AFlutter/ATach, HLD. ? ?She comes in today to be seen for Dr. Curt Bears, last seen by him 08/09/21, she mentioned some new SOB about 2 weeks post op after her hysterectomy (for uterine CA), she had a new LBBB and her flecainide stopped.  Planned for an AFib clinic visit to see if her QRS normalized off flecainide and discuss alternative AAD ?SOB felt likely multifactorial, referred to weight loss clinic and check a BNP, +/- echo pending symptom evolution. ? ?BNP was <36 ? ?08/22/21 ER visit feeling a bit unwell with concerns of UTI (recurrent since her hysterectomy), and some concern that she may be in Afib, she went initially to an [facility], given a dose of Rocephin, perhaps for ?UTI but transferred via EMS to the ER for rapid AFib ?In route got IV bolus of dilt, in the ER IV lopressor ?UA not felt c/w UTI ?EKG was an SVT 172bpm, probably AFlutter ? > SR T 109bpm QRS 47m ? ?Another ER visit yesterday 08/30/21 with palpitations, apple watch alerted to HR 160's and called EMS, by ER record was found in an SVT though had spontaneous conversion with EMS, brought to the ER, maintained SR, declined labs/further eval outside of her EKG, given she had just been there/had labs done days earlier and requested to go home. ? ?TODAY ?No clear trigger other then just being off her flecainide. ?Outside of her tachycardia she is doing well ?No CP, SOB. ?No near syncope or syncope ?She has felt not quite herself, more sluggish, tired since her 08/22/21 episode. ? ?She is not taking the Bactrim ? ? ?AFib/AAD Hx ?Diagnosed 2014 ?Flecainide stopped 08/09/21 2/2 development of LBBB ? ?Past Medical History:  ?Diagnosis Date  ? Atrial tachycardia ([place]   ? a.  Dx 03/2013.  ? Chest pain   ? a. 03/2013: cath negative for CAD, no PE by CT angio.  ? Dysrhythmia   ? Hyperglycemia   ? a. A1c 5.8 in 03/2013.  ? Hyperlipidemia   ? a. Monitored by PCP - elected to pursue lifestyle modification in 09/2012 with reassessment pending.  ? PAC (premature atrial contraction)   ? a. Dx 03/2013.  ? PAF (paroxysmal atrial fibrillation) ([place]   ? a. Dx 03/2013 - started on flecainide. CHADSVASC = 1 so no anticoag at present time.  ? Paroxysmal atrial flutter ([place]   ? a. Dx 03/2013.  ? Pulmonary nodule, right   ? a. 391mRML by CT angio 03/2013.  ? ? ?Past Surgical History:  ?Procedure Laterality Date  ? APPENDECTOMY    ? CARDIOVERSION N/A 08/20/2019  ? Procedure: CARDIOVERSION;  Surgeon: CrLelon PerlaMD;  [facility] Service: Cardiovascular;  Laterality: N/A;  ? CARDIOVERSION N/A 03/31/2020  ? Procedure: CARDIOVERSION;  Surgeon: ChBuford DresserMD;  [facility];  Service: Cardiovascular;  Laterality: N/A;  ? CHOLECYSTECTOMY    ? HERNIA REPAIR    ? LEFT HEART CATHETERIZATION WITH CORONARY ANGIOGRAM N/A 03/28/2013  ? Procedure: LEFT HEART CATHETERIZATION WITH CORONARY ANGIOGRAM;  Surgeon: JrRamond DialMD;  [facility];  Service: Cardiovascular;  Laterality: N/A;  ? TEE WITHOUT CARDIOVERSION N/A 08/20/2019  ? Procedure: TRANSESOPHAGEAL ECHOCARDIOGRAM (TEE);  Surgeon: Lelon Perla, MD;  Location[facility] END[facility]COPY;  Service: Cardiovascular;  Laterality: N/A;  ? ? ?Current Outpatient Medications  ?Medication Sig Dispense Refill  ? apixaban (ELIQUIS) 5 MG TABS tablet Take 1 tablet (5 mg total) by mouth 2 (two) times daily. 180 tablet 3  ? Ascorbic Acid (VITAMIN C) 1000 MG tablet Take 1,000 mg by mouth every evening.    ? ELDERBERRY PO Take 15 mLs by mouth at bedtime.     ? furosemide (LASIX) 20 MG tablet Take 1 tablet (20 mg total) by mouth daily as needed for fluid. 30 tablet 3  ? loratadine (CLARITIN) 10 MG tablet Take 10 mg by mouth at bedtime.      ? metoprolol succinate (TOPROL-XL) 50 MG 24 hr tablet Take 25 mg by mouth in the morning and at bedtime. Take with or immediately following a meal. 90 tablet 3  ? metoprolol tartrate (LOPRESSOR) 50 MG tablet Take 1 tablet (50 mg total) by mouth daily as needed (consistent heart greater than 120 (longer than an hour at rest)). 60 tablet 6  ? Multiple Vitamins-Minerals (MULTIVITAMIN WITH MINERALS) tablet Take 1 tablet by mouth daily.    ? pantoprazole (PROTONIX) 40 MG tablet Take 1 tablet (40 mg total) by mouth daily. 90 tablet 3  ? sulfamethoxazole-trimethoprim (BACTRIM DS) 800-160 MG tablet Take 1 tablet by mouth 2 (two) times daily.    ? ?No current facility-administered medications for this visit.  ? ? ?Allergies:   Tape  ? ?Social History:  The patient  reports that she has never smoked. She has never used smokeless tobacco. She reports current alcohol use. She reports that she does not use drugs.  ? ?Family History:  The patient's family history includes Breast cancer in her sister; Colon cancer in her mother; Colon polyps in her sister; Diabetes in her paternal grandfather and paternal grandmother; Heart disease in an other family member; Rectal cancer in her mother; Stroke in her paternal grandfather and paternal grandmother. ? ?ROS:  Please see the history of present illness.    ?All other systems are reviewed and otherwise negative.  ? ?PHYSICAL EXAM:  ?VS:  LMP 11/02/2019  BMI: There is no height or weight on file to calculate BMI. ?Well nourished, well developed, in no acute distress ?HEENT: normocephalic, atraumatic ?Neck: no JVD, carotid bruits or masses ?Cardiac:  RRR; no significant murmurs, no rubs, or gallops ?Lungs:  CTA b/l, no wheezing, rhonchi or rales ?Abd: soft, nontender ?MS: no deformity or atrophy ?Ext: no edema ?Skin: warm and dry, no rash ?Neuro:  No gross deficits appreciated ?Psych: euthymic mood, full affect ? ? ?EKG:  Done today and reviewed by myself shows  ?SR 92bpm, QRS 12m, QTc  447 ? ?08/22/21: AFlutter, 172bpm ? ? ?08/20/2019: TTE ?1. Normal LV systolic function; mild LAE; no LAA thrombus; mild MR and  ?TR.  ? 2. Left ventricular ejection fraction, by estimation, is 55 to 60%. The  ?left ventricle has normal function. The left ventricle has no regional  ?wall motion abnormalities.  ? 3. Right ventricular systolic function is normal. The right ventricular  ?size is normal.  ? 4. Left atrial size was mildly dilated. No left atrial/left atrial  ?appendage thrombus was detected.  ? 5. The mitral valve is normal in structure. Mild mitral valve  ?regurgitation.  ? 6. The aortic valve is tricuspid. Aortic valve regurgitation is not  ?visualized.  ? ? ?03/28/2013: [facility] ?Final Conclusions:   ?1. Normal coronary arteries ?2.  Normal LV function ? ?Recent Labs: ?08/09/2021: NT-Pro BNP <36 ?08/22/2021: ALT 23; BUN 12; Creatinine, Ser 1.03; Hemoglobin 12.9; Magnesium 2.0; Platelets 220; Potassium 3.7; Sodium 140  ?No results found for requested labs within last 8760 hours.  ? ?CrCl cannot be calculated (Unknown ideal weight.).  ? ?Wt Readings from Last 3 Encounters:  ?08/09/21 (!) 327 lb 12.8 oz (148.7 kg)  ?02/01/21 (!) 301 lb 12.8 oz (136.9 kg)  ?07/06/20 (!) 312 lb 9.6 oz (141.8 kg)  ?  ? ?Other studies reviewed: ?Additional studies/records reviewed today include: summarized above ? ?ASSESSMENT AND PLAN: ? ?Paroxysmal AFib, atypical AFlutter ?ATach ?CHA2DS2Vasc is one for gender, on Eliquis, appropriately dosed ? ?She has a tendency to get quite bradycardic by history, will avoid up titration of her toprol, she has prn lopressor with instructions by camnitz  ? ?Not an ablation candidate currently, and she is also known apparently to have 3 different arrhythmias. ?I  think she is a good Tikosyn candidate, we could try Multaq as well ?Discussed both options with her, she would like to give the options some thought, 4 days in the hospital less then ideal of course. ?She will let us know what she would like to  do, will plan a f/u visit in a couple weeks. ? ?Should she have recurrent tachycardia/ER visit in the meantime, is instructed to have them give [place] a call. ? ? ?Disposition: TSH today and F/u as Glynis Smiles

## 2021-08-30 ENCOUNTER — Emergency Department (HOSPITAL_COMMUNITY)
Admission: EM | Admit: 2021-08-30 | Discharge: 2021-08-30 | Discharge status: Against Medical Advice | Payer: Medicaid Other | Attending: Emergency Medicine | Admitting: Emergency Medicine

## 2021-08-30 ENCOUNTER — Telehealth: Payer: Self-pay | Admitting: Cardiology

## 2021-08-30 ENCOUNTER — Other Ambulatory Visit: Payer: Self-pay

## 2021-08-30 ENCOUNTER — Encounter (HOSPITAL_COMMUNITY): Payer: Self-pay | Admitting: Emergency Medicine

## 2021-08-30 DIAGNOSIS — Z5321 Procedure and treatment not carried out due to patient leaving prior to being seen by health care provider: Secondary | ICD-10-CM | POA: Diagnosis not present

## 2021-08-30 DIAGNOSIS — I471 Supraventricular tachycardia: Secondary | ICD-10-CM | POA: Diagnosis not present

## 2021-08-30 DIAGNOSIS — R002 Palpitations: Secondary | ICD-10-CM | POA: Diagnosis present

## 2021-08-30 NOTE — Telephone Encounter (Signed)
Pt reports she is back in NSR . ?Pt feeling fatigued after episode, but doing well. ?She is scheduled to see PA tomorrow to further evaluate/discuss. ?Patient verbalized understanding and agreeable to plan.  ? ?

## 2021-08-30 NOTE — Telephone Encounter (Signed)
Patient c/o Palpitations:  High priority if patient c/o lightheadedness, shortness of breath, or chest pain ? ?How long have you had palpitations/irregular HR/ Afib? Are you having the symptoms now? Afib started about 1:00 AM this morning.  ? ?Are you currently experiencing lightheadedness, SOB or CP? No  ? ?Do you have a history of afib (atrial fibrillation) or irregular heart rhythm? Yes  ? ?Have you checked your BP or HR? (document readings if available): HR ranging 83-110. HR is 83 right now. Does not know what BP is.  ? ?Are you experiencing any other symptoms? Tired  ? ? ?Patient went to the hospital this morning for Afib, but states the afib stopped so she left. Is wanting to be worked in for an appt today.  ?

## 2021-08-30 NOTE — ED Triage Notes (Signed)
Patient from home, woke up with palpitations.  Her Apple Watch showed HR in 160's.  EMS found her in 170's, SVT.  Patient was moved to stretcher and given oxygen and she converted on her own.  No chest pain, no shortness at this time.   ?

## 2021-08-30 NOTE — ED Notes (Signed)
Pt stated that she would be leaving. Seen leaving ED. ?

## 2021-08-30 NOTE — ED Provider Triage Note (Addendum)
Emergency Medicine Provider Triage Evaluation Note ? ?Sandra Stark , a 56 y.o. female  was evaluated in triage.  Pt complains of palpitations upon waking, HR 160's on apple watch.  Found to be in SVT with EMS but self converted prior to transport.  Hx of PAF, on eliquis.  Never had any SOB.  Does report she was taken off her flecainide cold [place] 3 weeks ago by her primary care doctor because her "levels were high". ? ?Review of Systems  ?Positive: SVT, resolved ?Negative: fever ? ?Physical Exam  ?BP 140/81   Pulse (!) 114   Temp 98.2 ?F (36.8 ?C) (Oral)   Resp 20   LMP 11/02/2019   SpO2 97%  ? ?Gen:   Awake, no distress   ?Resp:  Normal effort  ?MSK:   Moves extremities without difficulty  ?Other:   ? ?Medical Decision Making  ?Medically screening exam initiated at 2:59 AM.  Appropriate orders placed.  [name] was informed that the remainder of the evaluation will be completed by another provider, this initial triage assessment does not replace that evaluation, and the importance of remaining in the ED until their evaluation is complete. ? ?SVT, resolved.  Hx of AFIB, taken off flecainide 3 weeks ago by PCP.  Denies chest pain, states she feels at baseline currently.  EKG, labs, CXR. ? ?3:17 AM ?Patient refused blood draw.  States she is feeling better and wants to go home.  She was encouraged to stay for full evaluation and monitoring, especially given her history, but states she wants to go home and will call her cardiologist in the morning.  She is AAOx3, capable of making her own medical decisions at this time. ?  ?Larene Pickett, PA-C ?08/30/21 0310 ? ?  ?Larene Pickett, PA-C ?08/30/21 [phone] ? ?

## 2021-08-31 ENCOUNTER — Encounter: Payer: Self-pay | Admitting: Physician Assistant

## 2021-08-31 ENCOUNTER — Ambulatory Visit (INDEPENDENT_AMBULATORY_CARE_PROVIDER_SITE_OTHER): Payer: Medicaid Other | Admitting: Physician Assistant

## 2021-08-31 VITALS — BP 128/82 | HR 92 | Ht 66.0 in | Wt 322.0 lb

## 2021-08-31 DIAGNOSIS — I480 Paroxysmal atrial fibrillation: Secondary | ICD-10-CM | POA: Diagnosis not present

## 2021-08-31 DIAGNOSIS — I471 Supraventricular tachycardia: Secondary | ICD-10-CM | POA: Diagnosis not present

## 2021-08-31 DIAGNOSIS — I483 Typical atrial flutter: Secondary | ICD-10-CM | POA: Diagnosis not present

## 2021-08-31 DIAGNOSIS — Z79899 Other long term (current) drug therapy: Secondary | ICD-10-CM | POA: Diagnosis not present

## 2021-08-31 NOTE — Patient Instructions (Signed)
Medication Instructions:  ? ?PLEASE LOOK INTO COST OF THESES MEDICATIONS : [place]( DOFETILIDE) ? ?*If you need a refill on your cardiac medications before your next appointment, please call your pharmacy* ? ? ?Lab Work: TSH TODAY  ? ?If you have labs (blood work) drawn today and your tests are completely normal, you will receive your results only by: ?MyChart Message (if you have MyChart) OR ?A paper copy in the mail ?If you have any lab test that is abnormal or we need to change your treatment, we will call you to review the results. ? ? ?Testing/Procedures: NONE ORDERED  TODAY ? ? ? ? ?Follow-Up: ?At [facility], you and your health needs are our priority.  As part of our continuing mission to provide you with exceptional heart care, we have created designated Provider Care Teams.  These Care Teams include your primary Cardiologist (physician) and Advanced Practice Providers (APPs -  Physician Assistants and Nurse Practitioners) who all work together to provide you with the care you need, when you need it. ? ?We recommend signing up for the patient portal called "MyChart".  Sign up information is provided on this After Visit Summary.  MyChart is used to connect with patients for Virtual Visits (Telemedicine).  Patients are able to view lab/test results, encounter notes, upcoming appointments, etc.  Non-urgent messages can be sent to your provider as well.   ?To learn more about what you can do with MyChart, go to [email or web address].   ? ?Your next appointment:   ?2 week(s) ? ?The format for your next appointment:   ?In Person ? ?Provider:   ?Tommye Standard, PA-C  ? ? ?Other Instructions ? ? ?Important Information About [place] ? ? ? ? ?  ?

## 2021-09-01 LAB — TSH: TSH: 1.91 u[IU]/mL (ref 0.450–4.500)

## 2021-09-07 ENCOUNTER — Telehealth: Payer: Self-pay | Admitting: Cardiology

## 2021-09-07 ENCOUNTER — Telehealth: Payer: Self-pay | Admitting: Pharmacist

## 2021-09-07 NOTE — Telephone Encounter (Signed)
Pt c/o medication issue:  1. Name of Medication:  Tikosyn  2. How are you currently taking this medication (dosage and times per day)?   3. Are you having a reaction (difficulty breathing--STAT)?   4. What is your medication issue?  Patient sates she was supposed to follow up to let Dr. Gardenia Phlegm, PA know which medication she would like to start. She states she would like to start tikosyn.

## 2021-09-07 NOTE — Telephone Encounter (Signed)
Medication list reviewed in anticipation of upcoming Tikosyn initiation. Patient has Bactrim on his med list which is contraindicated with Tikosyn. Need to clarify/confirm that he is no longer taking this. Office note 5/16 mentions she's not taking it but med list was not updated to reflect this. Otherwise, he is not taking any contraindicated or QTc prolonging medications.   Patient is anticoagulated on Eliquis '5mg'$  BID on the appropriate dose. Please ensure that patient has not missed any anticoagulation doses in the 3 weeks prior to Tikosyn initiation.   Patient will need to be counseled to avoid use of Benadryl while on Tikosyn and in the 2-3 days prior to Tikosyn initiation.

## 2021-09-07 NOTE — Telephone Encounter (Signed)
Per Carolan Clines office visit note from 08/31/2021:  Not an ablation candidate currently, and she is also known apparently to have 3 different arrhythmias. I  think she is a good Tikosyn candidate, we could try Multaq as well Discussed both options with her, she would like to give the options some thought, 4 days in the hospital less then ideal of course. She will let us know what she would like to do, will plan a f/u visit in a couple weeks.  Pt is scheduled for f/u 6/2 with Renee.  Will send to [facility] for review and any orders.

## 2021-09-08 NOTE — Addendum Note (Signed)
Addended by: Juluis Mire on: 09/08/2021 04:06 PM   Modules accepted: Orders

## 2021-09-08 NOTE — Telephone Encounter (Signed)
Tikosyn admission scheduled.

## 2021-09-15 ENCOUNTER — Telehealth: Payer: Self-pay | Admitting: Student

## 2021-09-15 NOTE — Telephone Encounter (Signed)
Fielded call from Ms. Manship.  Tahcycardia to the 160s this evening and ongoing but she feels otherwise pretty well with stable blood pressure.  She would like to avoid emergency department if feasible.  Instructed to take up to to 50 mg metoprolol tartrate.  If ineffective and/or develops significant symptoms instructed to present to the ED and we can expedite the admission for dofetilide loading +/- DCCV.  Delton Prairie, MD Cardiology Fellow [facility]

## 2021-09-17 ENCOUNTER — Ambulatory Visit: Payer: Medicaid Other | Admitting: Physician Assistant

## 2021-09-21 ENCOUNTER — Ambulatory Visit (HOSPITAL_COMMUNITY)
Admission: RE | Admit: 2021-09-21 | Discharge: 2021-09-21 | Disposition: A | Discharge status: Home / Self Care | Payer: Medicaid Other | Source: Ambulatory Visit | Attending: Physician Assistant | Admitting: Physician Assistant

## 2021-09-21 ENCOUNTER — Encounter (HOSPITAL_COMMUNITY): Payer: Self-pay | Admitting: Physician Assistant

## 2021-09-21 ENCOUNTER — Other Ambulatory Visit (HOSPITAL_COMMUNITY): Payer: Self-pay

## 2021-09-21 ENCOUNTER — Inpatient Hospital Stay (HOSPITAL_COMMUNITY)
Admission: AD | Admit: 2021-09-21 | Discharge: 2021-09-24 | DRG: 309 | Disposition: A | Discharge status: Home / Self Care | Payer: Medicaid Other | Source: Ambulatory Visit | Attending: Cardiology | Admitting: Cardiology

## 2021-09-21 VITALS — BP 132/90 | HR 162 | Ht 66.0 in | Wt 324.0 lb

## 2021-09-21 DIAGNOSIS — I4811 Longstanding persistent atrial fibrillation: Secondary | ICD-10-CM

## 2021-09-21 DIAGNOSIS — I4892 Unspecified atrial flutter: Secondary | ICD-10-CM | POA: Diagnosis present

## 2021-09-21 DIAGNOSIS — I483 Typical atrial flutter: Secondary | ICD-10-CM

## 2021-09-21 DIAGNOSIS — Z7901 Long term (current) use of anticoagulants: Secondary | ICD-10-CM | POA: Diagnosis not present

## 2021-09-21 DIAGNOSIS — Z6841 Body Mass Index (BMI) 40.0 and over, adult: Secondary | ICD-10-CM | POA: Diagnosis not present

## 2021-09-21 DIAGNOSIS — Z79899 Other long term (current) drug therapy: Secondary | ICD-10-CM

## 2021-09-21 DIAGNOSIS — I471 Supraventricular tachycardia: Secondary | ICD-10-CM | POA: Diagnosis present

## 2021-09-21 DIAGNOSIS — Z823 Family history of stroke: Secondary | ICD-10-CM | POA: Diagnosis not present

## 2021-09-21 DIAGNOSIS — Z833 Family history of diabetes mellitus: Secondary | ICD-10-CM | POA: Diagnosis not present

## 2021-09-21 DIAGNOSIS — Z91048 Other nonmedicinal substance allergy status: Secondary | ICD-10-CM | POA: Diagnosis not present

## 2021-09-21 DIAGNOSIS — Z8371 Family history of colonic polyps: Secondary | ICD-10-CM

## 2021-09-21 DIAGNOSIS — Z803 Family history of malignant neoplasm of breast: Secondary | ICD-10-CM | POA: Diagnosis not present

## 2021-09-21 DIAGNOSIS — Z800 Family history of malignant neoplasm of digestive organs: Secondary | ICD-10-CM | POA: Diagnosis not present

## 2021-09-21 DIAGNOSIS — Z9049 Acquired absence of other specified parts of digestive tract: Secondary | ICD-10-CM

## 2021-09-21 DIAGNOSIS — I4819 Other persistent atrial fibrillation: Secondary | ICD-10-CM

## 2021-09-21 DIAGNOSIS — E785 Hyperlipidemia, unspecified: Secondary | ICD-10-CM | POA: Diagnosis not present

## 2021-09-21 HISTORY — DX: Other persistent atrial fibrillation: I48.19

## 2021-09-21 LAB — BASIC METABOLIC PANEL
Anion gap: 9 (ref 5–15)
BUN: 14 mg/dL (ref 6–20)
CO2: 24 mmol/L (ref 22–32)
Calcium: 8.9 mg/dL (ref 8.9–10.3)
Chloride: 106 mmol/L (ref 98–111)
Creatinine, Ser: 1.07 mg/dL — ABNORMAL HIGH (ref 0.44–1.00)
GFR, Estimated: >60 mL/min (ref >60)
Glucose, Bld: 150 mg/dL — ABNORMAL HIGH (ref 70–99)
Potassium: 5.3 mmol/L — ABNORMAL HIGH (ref 3.5–5.1)
Sodium: 139 mmol/L (ref 135–145)

## 2021-09-21 LAB — HIV ANTIBODY (ROUTINE TESTING W REFLEX): HIV Screen 4th Generation wRfx: NONREACTIVE

## 2021-09-21 LAB — MAGNESIUM: Magnesium: 2.2 mg/dL (ref 1.7–2.4)

## 2021-09-21 MED ORDER — SODIUM CHLORIDE 0.9% FLUSH
3.0000 mL | Freq: Two times a day (BID) | INTRAVENOUS | Status: DC
  Administered 2021-09-21 – 2021-09-24 (×7): 3 mL via INTRAVENOUS

## 2021-09-21 MED ORDER — SODIUM CHLORIDE 0.9% FLUSH: 3.0000 mL | INTRAVENOUS | Status: DC | PRN

## 2021-09-21 MED ORDER — PANTOPRAZOLE SODIUM 40 MG PO TBEC
40.0000 mg | DELAYED_RELEASE_TABLET | Freq: Every day | ORAL | Status: DC
  Administered 2021-09-22 – 2021-09-24 (×3): 40 mg via ORAL
  Filled 2021-09-21 (×3): qty 1

## 2021-09-21 MED ORDER — SODIUM CHLORIDE 0.9 % IV SOLN: 250.0000 mL | INTRAVENOUS | Status: DC | PRN

## 2021-09-21 MED ORDER — METOPROLOL TARTRATE 50 MG PO TABS: 50.0000 mg | ORAL_TABLET | Freq: Every day | ORAL | Status: DC | PRN

## 2021-09-21 MED ORDER — APIXABAN 5 MG PO TABS
5.0000 mg | ORAL_TABLET | Freq: Two times a day (BID) | ORAL | Status: DC
  Administered 2021-09-21 – 2021-09-24 (×6): 5 mg via ORAL
  Filled 2021-09-21 (×6): qty 1

## 2021-09-21 MED ORDER — FUROSEMIDE 20 MG PO TABS: 20.0000 mg | ORAL_TABLET | Freq: Every day | ORAL | Status: DC | PRN

## 2021-09-21 MED ORDER — DOFETILIDE 500 MCG PO CAPS
500.0000 ug | ORAL_CAPSULE | Freq: Two times a day (BID) | ORAL | Status: DC
  Administered 2021-09-21: 500 ug via ORAL
  Filled 2021-09-21: qty 1

## 2021-09-21 MED ORDER — METOPROLOL SUCCINATE ER 25 MG PO TB24
25.0000 mg | ORAL_TABLET | Freq: Two times a day (BID) | ORAL | Status: DC
  Administered 2021-09-21 – 2021-09-24 (×6): 25 mg via ORAL
  Filled 2021-09-21 (×6): qty 1

## 2021-09-21 MED ORDER — LORATADINE 10 MG PO TABS
10.0000 mg | ORAL_TABLET | Freq: Every day | ORAL | Status: DC
  Administered 2021-09-21 – 2021-09-23 (×3): 10 mg via ORAL
  Filled 2021-09-21 (×3): qty 1

## 2021-09-21 NOTE — Plan of Care (Signed)
  Problem: Cardiac: Goal: Ability to achieve and maintain adequate cardiopulmonary perfusion will improve Outcome: Progressing   

## 2021-09-21 NOTE — TOC Initial Note (Addendum)
Transition of Care [facility]) - Initial/Assessment Note    Patient Details  Name: Sandra Stark MRN: [id number] Date of Birth: 05-23-1965  Transition of Care [facility]) CM/SW Contact:    Ninfa Meeker, RN Phone Number: 09/21/2021, 2:55 PM  Clinical Narrative:                 Transition of Care Screening Note:  Transition of Care Department [facility]) has reviewed patient and no TOC needs have been identified at this time. We will continue to monitor patient advancement through Interdisciplinary progressions. If new patient transition needs arise, please place a consult.          Patient Goals and CMS Choice        Expected Discharge Plan and Services                                                Prior Living Arrangements/Services                       Activities of Daily Living      Permission Sought/Granted                  Emotional Assessment              Admission diagnosis:  Persistent atrial fibrillation ([place]) [I48.19] Patient Active Problem List   Diagnosis Date Noted   Persistent atrial fibrillation ([place]) 09/21/2021   Atypical atrial flutter ([place]) 03/10/2020   Typical atrial flutter ([place]) 08/16/2019   Longstanding persistent atrial fibrillation ([place]) 01/03/2019   OSA (obstructive sleep apnea) 06/25/2013   Pulmonary nodule 04/08/2013   Paroxysmal atrial fibrillation ([place]) 03/29/2013   Atrial tachycardia ([place]) 03/29/2013   Sinus bradycardia 03/29/2013   Pulmonary nodule, right    Hyperglycemia    Chest pain 03/26/2013   Hyperlipidemia 03/26/2013   Obesity 03/26/2013   Paroxysmal atrial flutter ([place]) 03/26/2013   PAC (premature atrial contraction) 03/26/2013   PCP:  Raina Mina., MD Pharmacy:   [id number] [place] [address] [address]Phone: [phone] Fax: [phone]    Social Determinants of Health (SDOH) Interventions     Readmission Risk Interventions     View : No data to display.

## 2021-09-21 NOTE — TOC Benefit Eligibility Note (Signed)
Patient Teacher, English as a foreign language completed.    The patient is currently admitted and upon discharge could be taking dofetilide (Tikosyn) 500 mcg capsule.  The current 30 day co-pay is, $0.00.   The patient is insured through [employer] [place] [employer]     Lyndel Safe, [place] Patient Advocate Specialist [place] Patient Advocate Team Direct Number: [phone]  Fax: [phone]

## 2021-09-21 NOTE — Progress Notes (Signed)
Pharmacy: Dofetilide (Tikosyn) - Initial Consult Assessment and Electrolyte Replacement  Pharmacy consulted to assist in monitoring and replacing electrolytes in this 56 y.o. female admitted on 09/21/2021 undergoing dofetilide initiation. First dofetilide dose: 09/21/21  Assessment:  Patient Exclusion Criteria: If any screening criteria checked as "Yes", then  patient  should NOT receive dofetilide until criteria item is corrected.  If "Yes" please indicate correction plan.  YES  NO Patient  Exclusion Criteria Correction Plan   '[]'$   '[x]'$   Baseline QTc interval is greater than or equal to 440 msec. IF above YES box checked dofetilide contraindicated unless patient has ICD; then may proceed if QTc 500-550 msec or with known ventricular conduction abnormalities may proceed with QTc 550-600 msec. QTc = 0.42    '[]'$   '[x]'$   Patient is known or suspected to have a digoxin level greater than 2 ng/ml: No results found for: DIGOXIN     '[]'$   '[x]'$   Creatinine clearance less than 20 ml/min (calculated using Cockcroft-Gault, actual body weight and serum creatinine): Estimated Creatinine Clearance: 86.2 mL/min (A) (by C-G formula based on SCr of 1.07 mg/dL (H)).     '[]'$   '[x]'$  Patient has received drugs known to prolong the QT intervals within the last 48 hours (phenothiazines, tricyclics or tetracyclic antidepressants, erythromycin, H-1 antihistamines, cisapride, fluoroquinolones, azithromycin, ondansetron).   Updated information on QT prolonging agents is available to be searched on the following database:QT prolonging agents     '[]'$   '[x]'$   Patient received a dose of hydrochlorothiazide (Oretic) alone or in any combination including triamterene (Dyazide, Maxzide) in the last 48 hours.    '[]'$   '[x]'$  Patient received a medication known to increase dofetilide plasma concentrations prior to initial dofetilide dose:  Trimethoprim (Primsol, Proloprim) in the last 36 hours Verapamil (Calan, Verelan) in the last  36 hours or a sustained release dose in the last 72 hours Megestrol (Megace) in the last 5 days  Cimetidine (Tagamet) in the last 6 hours Ketoconazole (Nizoral) in the last 24 hours Itraconazole (Sporanox) in the last 48 hours  Prochlorperazine (Compazine) in the last 36 hours  Pt reports never starting Bactrim prescription   '[]'$   '[x]'$   Patient is known to have a history of torsades de pointes; congenital or acquired long QT syndromes.    '[]'$   '[x]'$   Patient has received a Class 1 antiarrhythmic with less than 2 half-lives since last dose. (Disopyramide, Quinidine, Procainamide, Lidocaine, Mexiletine, Flecainide, Propafenone)    '[]'$   '[x]'$   Patient has received amiodarone therapy in the past 3 months or amiodarone level is greater than 0.3 ng/ml.    Patient has been appropriately anticoagulated with apixaban '5mg'$  BID.  Labs:    Component Value Date/Time   K 5.3 (H) 09/21/2021 0941   MG 2.2 09/21/2021 0941     Plan: Potassium: K >/= 4: Appropriate to initiate Tikosyn, no replacement needed   >> EP aware K slightly high, ok to hold off on reversal from their standpoint  Magnesium: Mg >2: Appropriate to initiate Tikosyn, no replacement needed     Thank you for allowing pharmacy to participate in this patient's care   Arrie Senate, PharmD, BCPS, [place] Pharmacist [phone] Please check AMION for all [place] numbers 09/21/2021

## 2021-09-21 NOTE — Progress Notes (Signed)
Primary Care Physician: Raina Mina., MD Primary Cardiologist: Dr Aundra Dubin (remotely) Primary Electrophysiologist: Dr Curt Bears Referring Physician: Dr Alcide Clever is a 56 y.o. female with a history of paroxysmal atrial fibrillation, atrial flutter, atrial tachycardia, HLD who presents for follow up in the [facility].  The patient was initially diagnosed with atrial arrhythmias in 2014. She has been maintained on flecainide and metoprolol. Patient is not on anticoagulation for a CHADS2VASC score of 1. She was in her usual state of health until 08/15/19 when she presented to the ED with generalized fatigue for the last two weeks and was found to be in rapid atrial flutter. She was rate controlled and discharged. Patient states she was being treated for an URI with prednisone and abx prior to the onset of symptoms. Patient is s/p TEE/DCCV on 08/20/19. Unfortunately, she had reoccurrence of her atrial flutter on 08/26/19 and was cardioverted in the ER again. Patient is s/p DCCV on 03/31/20 and her flecainide was increased to 150 mg BID. Unfortunately, she had QRS widening on this dose and it had to be discontinued. She had two visits to the (ED 08/22/21 and 08/30/21) after stopping the medication with both atrial flutter and SVT.  On follow up today, patient presents for dofetilide admission. She reports she went back in atrial flutter this AM and took her PRN metoprolol. She states her heart rate will race for several minutes and then resolve. This has happened multiple times today. She does get SOB during this episodes. She denies any missed doses of anticoagulation in the past 3 weeks.   Today, she denies symptoms of chest pain, orthopnea, PND, lower extremity edema, presyncope, syncope, bleeding. The patient is tolerating medications without difficulties and is otherwise without complaint today.    Atrial Fibrillation Risk Factors:  she does not have symptoms  or diagnosis of sleep apnea. Negative sleep study 09/29/19. she does not have a history of rheumatic fever. she does not have a history of alcohol use. The patient does not have a history of early familial atrial fibrillation or other arrhythmias.  she has a BMI of Body mass index is 52.29 kg/m.Marland Kitchen Filed Weights   09/21/21 0926  Weight: (!) 147 kg     Family History  Problem Relation Age of Onset   Colon cancer Mother    Rectal cancer Mother    Heart disease Other        several family members on dad's side have had heart issues - earliest in their 95's   Stroke Paternal Grandfather    Diabetes Paternal Grandfather    Breast cancer Sister    Colon polyps Sister        x 2   Stroke Paternal Grandmother    Diabetes Paternal Grandmother      Atrial Fibrillation Management history:  Previous antiarrhythmic drugs: flecainide  Previous cardioversions: 08/20/19, 08/26/19, 03/31/20 Previous ablations: none CHADS2VASC score: 1 Anticoagulation history: Eliquis   Past Medical History:  Diagnosis Date   Atrial tachycardia ([place])    a. Dx 03/2013.   Chest pain    a. 03/2013: cath negative for CAD, no PE by CT angio.   Dysrhythmia    Hyperglycemia    a. A1c 5.8 in 03/2013.   Hyperlipidemia    a. Monitored by PCP - elected to pursue lifestyle modification in 09/2012 with reassessment pending.   PAC (premature atrial contraction)    a. Dx 03/2013.   PAF (paroxysmal  atrial fibrillation) ([place])    a. Dx 03/2013 - started on flecainide. CHADSVASC = 1 so no anticoag at present time.   Paroxysmal atrial flutter ([place])    a. Dx 03/2013.   Pulmonary nodule, right    a. 24m RML by CT angio 03/2013.   Past Surgical History:  Procedure Laterality Date   APPENDECTOMY     CARDIOVERSION N/A 08/20/2019   Procedure: CARDIOVERSION;  Surgeon: CLelon Perla MD;  [facility];  Service: Cardiovascular;  Laterality: N/A;   CARDIOVERSION N/A 03/31/2020   Procedure: CARDIOVERSION;   Surgeon: CBuford Dresser MD;  [facility];  Service: Cardiovascular;  Laterality: N/A;   CHOLECYSTECTOMY     HERNIA REPAIR     LEFT HEART CATHETERIZATION WITH CORONARY ANGIOGRAM N/A 03/28/2013   Procedure: LEFT HEART CATHETERIZATION WITH CORONARY ANGIOGRAM;  Surgeon: JRamond Dial MD;  [facility];  Service: Cardiovascular;  Laterality: N/A;   TEE WITHOUT CARDIOVERSION N/A 08/20/2019   Procedure: TRANSESOPHAGEAL ECHOCARDIOGRAM (TEE);  Surgeon: CLelon Perla MD;  [facility];  Service: Cardiovascular;  Laterality: N/A;    Current Outpatient Medications  Medication Sig Dispense Refill   apixaban (ELIQUIS) 5 MG TABS tablet Take 1 tablet (5 mg total) by mouth 2 (two) times daily. 180 tablet 3   Ascorbic Acid (VITAMIN C) 1000 MG tablet Take 1,000 mg by mouth every evening.     ELDERBERRY PO Take 15 mLs by mouth at bedtime.      furosemide (LASIX) 20 MG tablet Take 1 tablet (20 mg total) by mouth daily as needed for fluid. 30 tablet 3   loratadine (CLARITIN) 10 MG tablet Take 10 mg by mouth at bedtime.      metoprolol succinate (TOPROL-XL) 50 MG 24 hr tablet Take 25 mg by mouth in the morning and at bedtime. Take with or immediately following a meal. 90 tablet 3   metoprolol tartrate (LOPRESSOR) 50 MG tablet Take 1 tablet (50 mg total) by mouth daily as needed (consistent heart greater than 120 (longer than an hour at rest)). 60 tablet 6   Multiple Vitamins-Minerals (MULTIVITAMIN WITH MINERALS) tablet Take 1 tablet by mouth daily.     pantoprazole (PROTONIX) 40 MG tablet Take 1 tablet (40 mg total) by mouth daily. 90 tablet 3   No current facility-administered medications for this encounter.    Allergies  Allergen Reactions   Tape     Other reaction(s): Other (See Comments) Causes skin to tear    Social History   Socioeconomic History   Marital status: Divorced    Spouse name: Not on file   Number of children: 2   Years of education: Not  on file   Highest education level: Not on file  Occupational History    Employer: [employer]   Comment: Adminstrative assistance  Tobacco Use   Smoking status: Never   Smokeless tobacco: Never   Tobacco comments:    Never smoke 09/21/21  Vaping Use   Vaping Use: Never used  Substance and Sexual Activity   Alcohol use: Yes    Comment: rare - 2x/month   Drug use: No   Sexual activity: Yes  Other Topics Concern   Not on file  Social History Narrative   Not on file   Social Determinants of Health   Financial Resource Strain: Not on file  Food Insecurity: Not on file  Transportation Needs: Not on file  Physical Activity: Not on file  Stress: Not  on file  Social Connections: Not on file  Intimate Partner Violence: Not on file     ROS- All systems are reviewed and negative except as per the HPI above.  Physical Exam: Vitals:   09/21/21 0926  BP: 132/90  Pulse: (!) 162  Weight: (!) 147 kg  Height: '5\' 6"'$  (1.676 m)     GEN- The patient is a well appearing obese female, alert and oriented x 3 today.   HEENT-head normocephalic, atraumatic, sclera clear, conjunctiva pink, hearing intact, trachea midline. Lungs- Clear to ausculation bilaterally, normal work of breathing Heart- irregular rate and rhythm, no murmurs, rubs or gallops  GI- soft, NT, ND, + BS Extremities- no clubbing, cyanosis, or edema MS- no significant deformity or atrophy Skin- no rash or lesion Psych- euthymic mood, full affect Neuro- strength and sensation are intact   Wt Readings from Last 3 Encounters:  09/21/21 (!) 147 kg  08/31/21 (!) 146.1 kg  08/09/21 (!) 148.7 kg    EKG today demonstrates  Atrial flutter witih 2:1 block Vent. rate 162 BPM PR interval * ms QRS duration 118 ms QT/QTcB 286/469 ms  Echo 03/29/19 demonstrated   1. Left ventricular ejection fraction, by visual estimation, is 60 to  65%. The left ventricle has normal function. There is no left ventricular   hypertrophy.   2. The left ventricle has no regional wall motion abnormalities.   3. Global right ventricle has normal systolic function.The right  ventricular size is normal. No increase in right ventricular wall  thickness.   4. Left atrial size was normal.   5. Right atrial size was normal.   6. The mitral valve is normal in structure. Trivial mitral valve  regurgitation. No evidence of mitral stenosis.   7. The tricuspid valve is normal in structure. Tricuspid valve  regurgitation is not demonstrated.   8. The aortic valve was not well visualized. Aortic valve regurgitation  is not visualized. Mild to moderate aortic valve sclerosis/calcification  without any evidence of aortic stenosis.   9. The pulmonic valve was normal in structure. Pulmonic valve  regurgitation is not visualized.  10. Normal pulmonary artery systolic pressure.  11. The inferior vena cava is normal in size with greater than 50%  respiratory variability, suggesting right atrial pressure of 3 mmHg.   Epic records are reviewed at length today  CHA2DS2-VASc Score = 1  The patient's score is based upon: CHF History: 0 HTN History: 0 Diabetes History: 0 Stroke History: 0 Vascular Disease History: 0 Age Score: 0 Gender Score: 1      ASSESSMENT AND PLAN: 1. Persistent Atrial Fibrillation/atrial flutter/atrial tachycardia The patient's CHA2DS2-VASc score is 1, indicating a 0.6% annual risk of stroke.   Patient presents for dofetilide loading. She is in atrial flutter today, heart rate significantly improved during the office visit (80s bpm) Continue Eliquis 5 mg BID, states no missed doses in the last 3 weeks. No recent benadryl use PharmD has screened medications QTc in SR 447 ms Labs today show creatinine at 1.07, K+ 5.3 and mag 2.2, CrCl calculated at 136 mL/min Continue Toprol 25 mg BID Continue Lopressor 50 mg q 6 hours for heart rate >100.   2. Obesity Body mass index is 52.29 kg/m. Lifestyle  modification was discussed and encouraged including regular physical activity and weight reduction.   To be admitted later today once a bed becomes available.    [facility] [address] [place], [place] [address]  [phone] 09/21/2021 9:51 AM

## 2021-09-22 ENCOUNTER — Encounter (HOSPITAL_COMMUNITY): Payer: Self-pay | Admitting: Anesthesiology

## 2021-09-22 LAB — BASIC METABOLIC PANEL
Anion gap: 7 (ref 5–15)
BUN: 11 mg/dL (ref 6–20)
CO2: 27 mmol/L (ref 22–32)
Calcium: 8.9 mg/dL (ref 8.9–10.3)
Chloride: 105 mmol/L (ref 98–111)
Creatinine, Ser: 0.98 mg/dL (ref 0.44–1.00)
GFR, Estimated: >60 mL/min (ref >60)
Glucose, Bld: 116 mg/dL — ABNORMAL HIGH (ref 70–99)
Potassium: 4.2 mmol/L (ref 3.5–5.1)
Sodium: 139 mmol/L (ref 135–145)

## 2021-09-22 LAB — MAGNESIUM: Magnesium: 2 mg/dL (ref 1.7–2.4)

## 2021-09-22 MED ORDER — DOFETILIDE 250 MCG PO CAPS
250.0000 ug | ORAL_CAPSULE | Freq: Two times a day (BID) | ORAL | Status: DC
  Administered 2021-09-22 – 2021-09-24 (×5): 250 ug via ORAL
  Filled 2021-09-22 (×5): qty 1

## 2021-09-22 MED ORDER — MAGNESIUM SULFATE 2 GM/50ML IV SOLN
2.0000 g | Freq: Once | INTRAVENOUS | Status: AC
Start: 2021-09-22 — End: 2021-09-22
  Administered 2021-09-22: 2 g via INTRAVENOUS
  Filled 2021-09-22: qty 50

## 2021-09-22 NOTE — Progress Notes (Addendum)
Progress Note  Patient Name: Sandra Stark Date of Encounter: 09/22/2021  [facility] Cardiologist: Lucciana Head Meredith Leeds, MD   Subjective   Feels well, slept well  Inpatient Medications    Scheduled Meds:  apixaban  5 mg Oral BID   dofetilide  250 mcg Oral BID   loratadine  10 mg Oral QHS   metoprolol succinate  25 mg Oral BID   pantoprazole  40 mg Oral Daily   sodium chloride flush  3 mL Intravenous Q12H   Continuous Infusions:  sodium chloride     magnesium sulfate bolus IVPB     PRN Meds: sodium chloride, furosemide, metoprolol tartrate, sodium chloride flush   Vital Signs    Vitals:   09/21/21 1550 09/21/21 2035 09/22/21 0017 09/22/21 0415  BP: 113/61 117/63 (!) 113/57 (!) 109/57  Pulse: 87 89 78 72  Resp: '18 18 18 18  '$ Temp: 98.3 F (36.8 C) 97.9 F (36.6 C) 97.9 F (36.6 C) 97.9 F (36.6 C)  TempSrc: Oral Oral Oral Oral  SpO2: 98% 94% 97% 100%  Height: '5\' 5"'$  (1.651 m)       Intake/Output Summary (Last 24 hours) at 09/22/2021 0814 Last data filed at 09/21/2021 2015 Gross per 24 hour  Intake 240 ml  Output --  Net 240 ml      09/21/2021    9:26 AM 08/31/2021    2:44 PM 08/09/2021   12:15 PM  Last 3 Weights  Weight (lbs) 324 lb 322 lb 327 lb 12.8 oz  Weight (kg) 146.965 kg 146.058 kg 148.689 kg      Telemetry    SR 70's-80's - Personally Reviewed  ECG    SR 80bpm, QTc 518m - Personally Reviewed  Physical Exam   GEN: No acute distress.   Neck: No JVD Cardiac: RRR, no murmurs, rubs, or gallops.  Respiratory: CTA b/l. GI: Soft, nontender, non-distended  MS: No edema; No deformity. Neuro:  Nonfocal  Psych: Normal affect   Labs    High Sensitivity Troponin:  No results for input(s): TROPONINIHS in the last 720 hours.   Chemistry Recent Labs  Lab 09/21/21 0941 09/22/21 0325  NA 139 139  K 5.3* 4.2  CL 106 105  CO2 24 27  GLUCOSE 150* 116*  BUN 14 11  CREATININE 1.07* 0.98  CALCIUM 8.9 8.9  MG 2.2 2.0  GFRNONAA >60 >60   ANIONGAP 9 7    Lipids No results for input(s): CHOL, TRIG, HDL, LABVLDL, LDLCALC, CHOLHDL in the last 168 hours.  HematologyNo results for input(s): WBC, RBC, HGB, HCT, MCV, MCH, MCHC, RDW, PLT in the last 168 hours. Thyroid No results for input(s): TSH, FREET4 in the last 168 hours.  BNPNo results for input(s): BNP, PROBNP in the last 168 hours.  DDimer No results for input(s): DDIMER in the last 168 hours.   Radiology    No results found.  Cardiac Studies   08/20/2019: TTE 1. Normal LV systolic function; mild LAE; no LAA thrombus; mild MR and  TR.   2. Left ventricular ejection fraction, by estimation, is 55 to 60%. The  left ventricle has normal function. The left ventricle has no regional  wall motion abnormalities.   3. Right ventricular systolic function is normal. The right ventricular  size is normal.   4. Left atrial size was mildly dilated. No left atrial/left atrial  appendage thrombus was detected.   5. The mitral valve is normal in structure. Mild mitral valve  regurgitation.  6. The aortic valve is tricuspid. Aortic valve regurgitation is not  visualized.      03/28/2013: [facility] Final Conclusions:   1. Normal coronary arteries 2. Normal LV function  Patient Profile     56 y.o. female  with history of AFib/AFlutter/ATach, HLD, obesity admitted for Tikosyn start  Assessment & Plan    Paroxysmal AFib, atypical AFlutter ATach CHA2DS2Vasc is one for gender, on Eliquis, appropriately dosed Tikosyn load is in progress K+ 4.2 Mag 2.0 Creat 0.98 (stable) QT got long, reducing to 260mg this AM  Reports a negative sleep study   For questions or updates, please contact [facility] Please consult [email or web address] for contact info under        Signed, RBaldwin Jamaica PA-C  09/22/2021, 8:14 AM    I have seen and examined this patient with RTommye Standard  Agree with above, note added to reflect my findings.  Currently feeling well.  In sinus rhythm  today.  GEN: Well nourished, well developed, in no acute distress  HEENT: normal  Neck: no JVD, carotid bruits, or masses Cardiac: RRR; no murmurs, rubs, or gallops,no edema  Respiratory:  clear to auscultation bilaterally, normal work of breathing GI: soft, nontender, nondistended, + BS MS: no deformity or atrophy  Skin: warm and dry Neuro:  Strength and sensation are intact Psych: euthymic mood, full affect   Atrial fibrillation/atrial flutter/atrial tachycardia: Admitted for dofetilide load.  QTc has prolonged.  We Alexiana Laverdure reduce the dose of dofetilide to 250 mcg twice daily.  Continue anticoagulation with Eliquis.  Carron Jaggi M. Mistee Soliman MD 09/22/2021 8:30 AM

## 2021-09-22 NOTE — Progress Notes (Signed)
Pharmacy: Dofetilide (Tikosyn) - Follow Up Assessment and Electrolyte Replacement  Pharmacy consulted to assist in monitoring and replacing electrolytes in this 56 y.o. female admitted on 09/21/2021 undergoing dofetilide initiation. First dofetilide dose: 09/21/21  Labs:    Component Value Date/Time   K 4.2 09/22/2021 0325   MG 2.0 09/22/2021 0325     Plan: Potassium: K >/= 4: No additional supplementation needed  Magnesium: Mg 1.8-2: Give Mg 2 gm IV x1    Thank you for allowing pharmacy to participate in this patient's care   Arrie Senate, PharmD, BCPS, [facility] Clinical Pharmacist [phone] Please check AMION for all [place] numbers 09/22/2021

## 2021-09-22 NOTE — Plan of Care (Signed)
  Problem: Cardiac: Goal: Ability to achieve and maintain adequate cardiopulmonary perfusion will improve Outcome: Progressing   

## 2021-09-22 NOTE — Anesthesia Preprocedure Evaluation (Deleted)
Anesthesia Evaluation    Reviewed: Allergy & Precautions, Patient's Chart, lab work & pertinent test results  History of Anesthesia Complications Negative for: history of anesthetic complications  Airway        Dental   Pulmonary neg pulmonary ROS,           Cardiovascular + dysrhythmias Atrial Fibrillation      Neuro/Psych negative neurological ROS     GI/Hepatic negative GI ROS, Neg liver ROS,   Endo/Other  Morbid obesity  Renal/GU negative Renal ROS     Musculoskeletal negative musculoskeletal ROS (+)   Abdominal   Peds  Hematology negative hematology ROS (+)   Anesthesia Other Findings   Reproductive/Obstetrics                             Anesthesia Physical Anesthesia Plan  ASA: 3  Anesthesia Plan: General   Post-op Pain Management: Minimal or no pain anticipated   Induction: Intravenous  PONV Risk Score and Plan: 3 and Treatment may vary due to age or medical condition and TIVA  Airway Management Planned: Mask  Additional Equipment:   Intra-op Plan:   Post-operative Plan:   Informed Consent:   Plan Discussed with: Anesthesiologist  Anesthesia Plan Comments:         Anesthesia Quick Evaluation

## 2021-09-22 NOTE — H&P (Signed)
Primary Care Physician: Raina Mina., MD Primary Cardiologist: Dr Aundra Dubin (remotely) Primary Electrophysiologist: Dr Curt Bears Referring Physician: Dr Alcide Clever is a 56 y.o. female with a history of paroxysmal atrial fibrillation, atrial flutter, atrial tachycardia, HLD who presents for follow up in the [facility].  The patient was initially diagnosed with atrial arrhythmias in 2014. She has been maintained on flecainide and metoprolol. Patient is not on anticoagulation for a CHADS2VASC score of 1. She was in her usual state of health until 08/15/19 when she presented to the ED with generalized fatigue for the last two weeks and was found to be in rapid atrial flutter. She was rate controlled and discharged. Patient states she was being treated for an URI with prednisone and abx prior to the onset of symptoms. Patient is s/p TEE/DCCV on 08/20/19. Unfortunately, she had reoccurrence of her atrial flutter on 08/26/19 and was cardioverted in the ER again. Patient is s/p DCCV on 03/31/20 and her flecainide was increased to 150 mg BID. Unfortunately, she had QRS widening on this dose and it had to be discontinued. She had two visits to the (ED 08/22/21 and 08/30/21) after stopping the medication with both atrial flutter and SVT.   On follow up today, patient presents for dofetilide admission. She reports she went back in atrial flutter this AM and took her PRN metoprolol. She states her heart rate will race for several minutes and then resolve. This has happened multiple times today. She does get SOB during this episodes. She denies any missed doses of anticoagulation in the past 3 weeks.    Today, she denies symptoms of chest pain, orthopnea, PND, lower extremity edema, presyncope, syncope, bleeding. The patient is tolerating medications without difficulties and is otherwise without complaint today.      Atrial Fibrillation Risk Factors:   she does not have  symptoms or diagnosis of sleep apnea. Negative sleep study 09/29/19. she does not have a history of rheumatic fever. she does not have a history of alcohol use. The patient does not have a history of early familial atrial fibrillation or other arrhythmias.   she has a BMI of Body mass index is 52.29 kg/m.Marland Kitchen    Filed Weights    09/21/21 0926  Weight: (!) 147 kg             Family History  Problem Relation Age of Onset   Colon cancer Mother     Rectal cancer Mother     Heart disease Other          several family members on dad's side have had heart issues - earliest in their 17's   Stroke Paternal Grandfather     Diabetes Paternal Grandfather     Breast cancer Sister     Colon polyps Sister          x 2   Stroke Paternal Grandmother     Diabetes Paternal Grandmother          Atrial Fibrillation Management history:   Previous antiarrhythmic drugs: flecainide  Previous cardioversions: 08/20/19, 08/26/19, 03/31/20 Previous ablations: none CHADS2VASC score: 1 Anticoagulation history: Eliquis         Past Medical History:  Diagnosis Date   Atrial tachycardia ([place])      a. Dx 03/2013.   Chest pain      a. 03/2013: cath negative for CAD, no PE by CT angio.   Dysrhythmia  Hyperglycemia      a. A1c 5.8 in 03/2013.   Hyperlipidemia      a. Monitored by PCP - elected to pursue lifestyle modification in 09/2012 with reassessment pending.   PAC (premature atrial contraction)      a. Dx 03/2013.   PAF (paroxysmal atrial fibrillation) ([place])      a. Dx 03/2013 - started on flecainide. CHADSVASC = 1 so no anticoag at present time.   Paroxysmal atrial flutter ([place])      a. Dx 03/2013.   Pulmonary nodule, right      a. 78m RML by CT angio 03/2013.         Past Surgical History:  Procedure Laterality Date   APPENDECTOMY       CARDIOVERSION N/A 08/20/2019    Procedure: CARDIOVERSION;  Surgeon: CLelon Perla MD;  [facility];  Service: Cardiovascular;  Laterality:  N/A;   CARDIOVERSION N/A 03/31/2020    Procedure: CARDIOVERSION;  Surgeon: CBuford Dresser MD;  [facility];  Service: Cardiovascular;  Laterality: N/A;   CHOLECYSTECTOMY       HERNIA REPAIR       LEFT HEART CATHETERIZATION WITH CORONARY ANGIOGRAM N/A 03/28/2013    Procedure: LEFT HEART CATHETERIZATION WITH CORONARY ANGIOGRAM;  Surgeon: JRamond Dial MD;  [facility];  Service: Cardiovascular;  Laterality: N/A;   TEE WITHOUT CARDIOVERSION N/A 08/20/2019    Procedure: TRANSESOPHAGEAL ECHOCARDIOGRAM (TEE);  Surgeon: CLelon Perla MD;  [facility];  Service: Cardiovascular;  Laterality: N/A;            Current Outpatient Medications  Medication Sig Dispense Refill   apixaban (ELIQUIS) 5 MG TABS tablet Take 1 tablet (5 mg total) by mouth 2 (two) times daily. 180 tablet 3   Ascorbic Acid (VITAMIN C) 1000 MG tablet Take 1,000 mg by mouth every evening.       ELDERBERRY PO Take 15 mLs by mouth at bedtime.        furosemide (LASIX) 20 MG tablet Take 1 tablet (20 mg total) by mouth daily as needed for fluid. 30 tablet 3   loratadine (CLARITIN) 10 MG tablet Take 10 mg by mouth at bedtime.        metoprolol succinate (TOPROL-XL) 50 MG 24 hr tablet Take 25 mg by mouth in the morning and at bedtime. Take with or immediately following a meal. 90 tablet 3   metoprolol tartrate (LOPRESSOR) 50 MG tablet Take 1 tablet (50 mg total) by mouth daily as needed (consistent heart greater than 120 (longer than an hour at rest)). 60 tablet 6   Multiple Vitamins-Minerals (MULTIVITAMIN WITH MINERALS) tablet Take 1 tablet by mouth daily.       pantoprazole (PROTONIX) 40 MG tablet Take 1 tablet (40 mg total) by mouth daily. 90 tablet 3    No current facility-administered medications for this encounter.           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  Tobacco Use   Smoking status: Never   Smokeless tobacco: Never   Tobacco comments:      Never smoke 09/21/21  Vaping Use   Vaping Use: Never used  Substance and Sexual Activity   Alcohol use: Yes      Comment: rare - 2x/month   Drug use: No   Sexual activity: Yes  Other Topics Concern   Not on file  Social History Narrative   Not on file    Social Determinants of Health    Financial Resource Strain: Not on file  Food Insecurity: Not on file  Transportation Needs: Not on file  Physical Activity: Not on file  Stress: Not on file  Social Connections: Not on file  Intimate Partner Violence: Not on file        ROS- All systems are reviewed and negative except as per the HPI above.   Physical Exam:    Vitals:    09/21/21 0926  BP: 132/90  Pulse: (!) 162  Weight: (!) 147 kg  Height: '5\' 6"'$  (1.676 m)        GEN- The patient is a well appearing obese female, alert and oriented x 3 today.   HEENT-head normocephalic, atraumatic, sclera clear, conjunctiva pink, hearing intact, trachea midline. Lungs- Clear to ausculation bilaterally, normal work of breathing Heart- irregular rate and rhythm, no murmurs, rubs or gallops  GI- soft, NT, ND, + BS Extremities- no clubbing, cyanosis, or edema MS- no significant deformity or atrophy Skin- no rash or lesion Psych- euthymic mood, full affect Neuro- strength and sensation are intact        Wt Readings from Last 3 Encounters:  09/21/21 (!) 147 kg  08/31/21 (!) 146.1 kg  08/09/21 (!) 148.7 kg      EKG today demonstrates  Atrial flutter witih 2:1 block Vent. rate 162 BPM PR interval * ms QRS duration 118 ms QT/QTcB 286/469 ms   Echo 03/29/19 demonstrated   1.  Left ventricular ejection fraction, by visual estimation, is 60 to  65%. The left ventricle has normal function. There is no left ventricular  hypertrophy.   2. The left ventricle has no regional wall motion abnormalities.   3. Global right ventricle has normal systolic function.The right  ventricular size is normal. No increase in right ventricular wall  thickness.   4. Left atrial size was normal.   5. Right atrial size was normal.   6. The mitral valve is normal in structure. Trivial mitral valve  regurgitation. No evidence of mitral stenosis.   7. The tricuspid valve is normal in structure. Tricuspid valve  regurgitation is not demonstrated.   8. The aortic valve was not well visualized. Aortic valve regurgitation  is not visualized. Mild to moderate aortic valve sclerosis/calcification  without any evidence of aortic stenosis.   9. The pulmonic valve was normal in structure. Pulmonic valve  regurgitation is not visualized.  10. Normal pulmonary artery systolic pressure.  11. The inferior vena cava is normal in size with greater than 50%  respiratory variability, suggesting right atrial pressure of 3 mmHg.    Epic records are reviewed at length today   CHA2DS2-VASc Score = 1  The patient's score is based upon: CHF History: 0 HTN History: 0 Diabetes History: 0 Stroke History: 0 Vascular Disease History: 0 Age Score: 0 Gender Score: 1        ASSESSMENT AND PLAN: 1. Persistent Atrial Fibrillation/atrial flutter/atrial tachycardia The patient's CHA2DS2-VASc score is  1, indicating a 0.6% annual risk of stroke.   Patient presents for dofetilide loading. She is in atrial flutter today, heart rate significantly improved during the office visit (80s bpm) Continue Eliquis 5 mg BID, states no missed doses in the last 3 weeks. No recent benadryl use PharmD has screened medications QTc in SR 447 ms Labs today show creatinine at 1.07, K+ 5.3 and mag 2.2, CrCl calculated at 136  mL/min Continue Toprol 25 mg BID Continue Lopressor 50 mg q 6 hours for heart rate >100.    2. Obesity Body mass index is 52.29 kg/m. Lifestyle modification was discussed and encouraged including regular physical activity and weight reduction.     To be admitted later today once a bed becomes available.       -------------------------  I have seen, examined the patient, and reviewed the above assessment and plan.    Sandra Stark is a 56 year old woman with persistent atrial fibrillation and flutter who presents for Tikosyn loading.  She is on Eliquis for stroke prophylaxis.  While in A-fib clinic she was in an atrial flutter but had converted back to sinus rhythm by the time I evaluated her on June 6.  Creatinine stable at 1.07.  QTc is acceptable for first dose of Tikosyn at 447 ms.  Physical exam today significant for an obese woman in no distress laying in bed at 30 degrees.  She has a regular rate and rhythm.  She is alert awake and oriented.  No increased work of breathing.  Plan to start Tikosyn June 6 evening with careful monitoring of QTc and creatinine.   Sandra Epley, MD 09/22/2021 8:34 AM

## 2021-09-22 NOTE — Plan of Care (Signed)

## 2021-09-22 NOTE — Progress Notes (Signed)
Post dose EKG is reviewed with Dr. Curt Bears. QTc is improved and OK to continue 28mg dose this evening  RTommye Standard PA-C

## 2021-09-23 ENCOUNTER — Encounter (HOSPITAL_COMMUNITY)
Admission: AD | Disposition: A | Discharge status: Home / Self Care | Payer: Self-pay | Source: Ambulatory Visit | Attending: Cardiology

## 2021-09-23 DIAGNOSIS — I4819 Other persistent atrial fibrillation: Secondary | ICD-10-CM | POA: Diagnosis not present

## 2021-09-23 LAB — BASIC METABOLIC PANEL
Anion gap: 5 (ref 5–15)
BUN: 7 mg/dL (ref 6–20)
CO2: 24 mmol/L (ref 22–32)
Calcium: 8.4 mg/dL — ABNORMAL LOW (ref 8.9–10.3)
Chloride: 109 mmol/L (ref 98–111)
Creatinine, Ser: 0.97 mg/dL (ref 0.44–1.00)
GFR, Estimated: >60 mL/min (ref >60)
Glucose, Bld: 164 mg/dL — ABNORMAL HIGH (ref 70–99)
Potassium: 4.3 mmol/L (ref 3.5–5.1)
Sodium: 138 mmol/L (ref 135–145)

## 2021-09-23 LAB — MAGNESIUM: Magnesium: 2.2 mg/dL (ref 1.7–2.4)

## 2021-09-23 SURGERY — CARDIOVERSION
Anesthesia: General

## 2021-09-23 MED ORDER — ACETAMINOPHEN 325 MG PO TABS
650.0000 mg | ORAL_TABLET | Freq: Four times a day (QID) | ORAL | Status: DC | PRN
  Administered 2021-09-23: 650 mg via ORAL
  Filled 2021-09-23: qty 2

## 2021-09-23 NOTE — Plan of Care (Signed)

## 2021-09-23 NOTE — Progress Notes (Signed)
Pharmacy: Dofetilide (Tikosyn) - Follow Up Assessment and Electrolyte Replacement  Pharmacy consulted to assist in monitoring and replacing electrolytes in this 56 y.o. female admitted on 09/21/2021 undergoing dofetilide initiation. First dofetilide dose: 09/21/21  Labs:    Component Value Date/Time   K 4.3 09/23/2021 0207   MG 2.2 09/23/2021 0207     Plan: Potassium: K >/= 4: No additional supplementation needed  Magnesium: Mg > 2: No additional supplementation needed   Thank you for allowing pharmacy to participate in this patient's care   Arrie Senate, PharmD, BCPS, [facility] Clinical Pharmacist [phone] Please check AMION for all [place] numbers 09/23/2021

## 2021-09-23 NOTE — Care Management (Signed)
09-23-21 1251 Case Manager spoke with patient regarding Tikosyn cost. First Rx fill to be sent to [facility] and patient wants Rx refills 90 day supply e-scribed to [facility]. No further needs identified at this time.

## 2021-09-23 NOTE — Progress Notes (Addendum)
Progress Note  Patient Name: Sandra Stark Date of Encounter: 09/23/2021  [facility] Cardiologist: Asha Grumbine Meredith Leeds, MD   Subjective   Feels very well  Inpatient Medications    Scheduled Meds:  apixaban  5 mg Oral BID   dofetilide  250 mcg Oral BID   loratadine  10 mg Oral QHS   metoprolol succinate  25 mg Oral BID   pantoprazole  40 mg Oral Daily   sodium chloride flush  3 mL Intravenous Q12H   Continuous Infusions:  sodium chloride     PRN Meds: sodium chloride, furosemide, metoprolol tartrate, sodium chloride flush   Vital Signs    Vitals:   09/22/21 1326 09/22/21 2014 09/23/21 0626 09/23/21 0759  BP: (!) 130/58 (!) 131/58 (!) 116/49 (!) 132/58  Pulse: 72 78 72   Resp: '15 18 17   '$ Temp: 97.6 F (36.4 C) 97.8 F (36.6 C) 97.9 F (36.6 C)   TempSrc: Oral Oral Oral   SpO2: 95% 96% 96%   Height:        Intake/Output Summary (Last 24 hours) at 09/23/2021 0909 Last data filed at 09/22/2021 2015 Gross per 24 hour  Intake 240 ml  Output --  Net 240 ml      09/21/2021    9:26 AM 08/31/2021    2:44 PM 08/09/2021   12:15 PM  Last 3 Weights  Weight (lbs) 324 lb 322 lb 327 lb 12.8 oz  Weight (kg) 146.965 kg 146.058 kg 148.689 kg      Telemetry    SR 70's-80's - Personally Reviewed  ECG    SR 74bpm,  manually measured  QT 400-475m, QTc 444-4854m - Personally Reviewed with Dr. CaCurt BearsPhysical Exam   Exam is unchanged GEN: No acute distress.   Neck: No JVD Cardiac: RRR, no murmurs, rubs, or gallops.  Respiratory: CTA b/l. GI: Soft, nontender, non-distended  MS: No edema; No deformity. Neuro:  Nonfocal  Psych: Normal affect   Labs    High Sensitivity Troponin:  No results for input(s): "TROPONINIHS" in the last 720 hours.   Chemistry Recent Labs  Lab 09/21/21 0941 09/22/21 0325 09/23/21 0207  NA 139 139 138  K 5.3* 4.2 4.3  CL 106 105 109  CO2 '24 27 24  '$ GLUCOSE 150* 116* 164*  BUN '14 11 7  '$ CREATININE 1.07* 0.98 0.97  CALCIUM 8.9  8.9 8.4*  MG 2.2 2.0 2.2  GFRNONAA >60 >60 >60  ANIONGAP '9 7 5    '$ Lipids No results for input(s): "CHOL", "TRIG", "HDL", "LABVLDL", "LDLCALC", "CHOLHDL" in the last 168 hours.  HematologyNo results for input(s): "WBC", "RBC", "HGB", "HCT", "MCV", "MCH", "MCHC", "RDW", "PLT" in the last 168 hours. Thyroid No results for input(s): "TSH", "FREET4" in the last 168 hours.  BNPNo results for input(s): "BNP", "PROBNP" in the last 168 hours.  DDimer No results for input(s): "DDIMER" in the last 168 hours.   Radiology    No results found.  Cardiac Studies   08/20/2019: TTE 1. Normal LV systolic function; mild LAE; no LAA thrombus; mild MR and  TR.   2. Left ventricular ejection fraction, by estimation, is 55 to 60%. The  left ventricle has normal function. The left ventricle has no regional  wall motion abnormalities.   3. Right ventricular systolic function is normal. The right ventricular  size is normal.   4. Left atrial size was mildly dilated. No left atrial/left atrial  appendage thrombus was detected.  5. The mitral valve is normal in structure. Mild mitral valve  regurgitation.   6. The aortic valve is tricuspid. Aortic valve regurgitation is not  visualized.      03/28/2013: [facility] Final Conclusions:   1. Normal coronary arteries 2. Normal LV function  Patient Profile     56 y.o. female  with history of AFib/AFlutter/ATach, HLD, obesity admitted for Tikosyn start  Assessment & Plan    Paroxysmal AFib, atypical AFlutter ATach CHA2DS2Vasc is one for gender, on Eliquis, appropriately dosed Tikosyn load is in progress K+ 4.3 Mag 2.2 Creat 0.97 (stable) QT is better then machine read and is acceptable  Reports a negative sleep study  Anticipate home tomorrow  For questions or updates, please contact [facility] Please consult [email or web address] for contact info under        Signed, Baldwin Jamaica, PA-C  09/23/2021, 9:09 AM    I have seen and examined this  patient with Tommye Standard.  Agree with above, note added to reflect my findings.  Feeling well without complaint.  Tolerating dofetilide without issue.  GEN: Well nourished, well developed, in no acute distress  HEENT: normal  Neck: no JVD, carotid bruits, or masses Cardiac: RRR; no murmurs, rubs, or gallops,no edema  Respiratory:  clear to auscultation bilaterally, normal work of breathing GI: soft, nontender, nondistended, + BS MS: no deformity or atrophy  Skin: warm and dry Neuro:  Strength and sensation are intact Psych: euthymic mood, full affect   Atrial fibrillation, atrial flutter, atrial tachycardia: Currently on dofetilide.  QTc is remained stable.  Dose reduced, with improvement in QTc.  We Tienna Bienkowski continue at the current dose.  Eliquis dose appropriately.  Saige Busby M. Andria Head MD 09/23/2021 10:31 AM

## 2021-09-24 ENCOUNTER — Other Ambulatory Visit: Payer: Self-pay

## 2021-09-24 ENCOUNTER — Encounter (HOSPITAL_COMMUNITY): Payer: Self-pay | Admitting: Cardiology

## 2021-09-24 ENCOUNTER — Other Ambulatory Visit (HOSPITAL_COMMUNITY): Payer: Self-pay

## 2021-09-24 LAB — MAGNESIUM: Magnesium: 1.9 mg/dL (ref 1.7–2.4)

## 2021-09-24 LAB — BASIC METABOLIC PANEL
Anion gap: 10 (ref 5–15)
BUN: 11 mg/dL (ref 6–20)
CO2: 23 mmol/L (ref 22–32)
Calcium: 9 mg/dL (ref 8.9–10.3)
Chloride: 107 mmol/L (ref 98–111)
Creatinine, Ser: 0.85 mg/dL (ref 0.44–1.00)
GFR, Estimated: >60 mL/min (ref >60)
Glucose, Bld: 109 mg/dL — ABNORMAL HIGH (ref 70–99)
Potassium: 4.2 mmol/L (ref 3.5–5.1)
Sodium: 140 mmol/L (ref 135–145)

## 2021-09-24 MED ORDER — DOFETILIDE 250 MCG PO CAPS
250.0000 ug | ORAL_CAPSULE | Freq: Two times a day (BID) | ORAL | 0 refills | Status: DC
Start: 2021-09-24 — End: 2021-09-30
  Filled 2021-09-24: qty 60, 30d supply, fill #0

## 2021-09-24 MED ORDER — DOFETILIDE 250 MCG PO CAPS
250.0000 ug | ORAL_CAPSULE | Freq: Two times a day (BID) | ORAL | 5 refills | Status: DC
Start: 2021-09-24 — End: 2021-09-24

## 2021-09-24 MED ORDER — MAGNESIUM SULFATE 2 GM/50ML IV SOLN
2.0000 g | Freq: Once | INTRAVENOUS | Status: DC
Start: 2021-09-24 — End: 2021-09-24
  Filled 2021-09-24: qty 50

## 2021-09-24 NOTE — Discharge Summary (Addendum)
ELECTROPHYSIOLOGY PROCEDURE DISCHARGE SUMMARY    Patient ID: Sandra Stark,  MRN: [id number], DOB/AGE: Oct 31, 1965 56 y.o.  Admit date: 09/21/2021 Discharge date: 09/24/2021  Primary Care Physician: Raina Mina., MD  Electrophysiologist: Dr. Curt Bears  Primary Discharge Diagnosis:  1.  Persistent atrial fibrillation status post Tikosyn loading this admission      CHA2DS2Vasc is one, on Eliquis 2. AFlutter 3. Atach   Secondary Discharge Diagnosis:  Obesity (morbid) HLD  Allergies  Allergen Reactions   Tape     Other reaction(s): Other (See Comments) Causes skin to tear     Procedures This Admission:  1.  Tikosyn loading  Brief HPI: Sandra Stark is a 56 y.o. female with a past medical history as noted above.  She is followed by EP in the oit patient setting.   Risks, benefits, and alternatives to Tikosyn were reviewed with the patient who wished to proceed.    Hospital Course:  The patient was admitted and Tikosyn was initiated.  Renal function and electrolytes were followed during the hospitalization.  The patient's QTc did get prolonged requiring reduction in dose, though subsequently remained stable. She arrived in SR and maintained through her stay  Final EKG is reviewed by myself and Dr. Curt Bears, manually measured QT 432m QTc 4457m  On the day of discharge, she feels well, was examined by Dr CaCurt Bearsho considered the patient stable for discharge to home.  Follow-up has been arranged with the AFib clinic in 1 week and with Dr CaUlyses Amorn 4 weeks.   Tikosyn teaching was completed No new or additional electrolyte replacement for home   Physical Exam: Vitals:   09/23/21 1257 09/23/21 2013 09/24/21 0338 09/24/21 0752  BP: (!) 118/55 (!) 124/56 (!) 126/46   Pulse:  87    Resp: 17     Temp: 98.2 F (36.8 C) 97.9 F (36.6 C) 97.9 F (36.6 C)   TempSrc: Oral Oral Oral   SpO2:  96% 99%   Weight:    (!) 144.7 kg  Height:    '5\' 5"'$  (1.651 m)     GEN- The  patient is well appearing, alert and oriented x 3 today.   HEENT: normocephalic, atraumatic; sclera clear, conjunctiva pink; hearing intact; oropharynx clear; neck supple, no JVP Lymph- no cervical lymphadenopathy Lungs- CTA b/l, normal work of breathing.  No wheezes, rales, rhonchi Heart- RRR, no murmurs, rubs or gallops, PMI not laterally displaced GI- soft, non-tender, non-distended Extremities- no clubbing, cyanosis, or edema MS- no significant deformity or atrophy Skin- warm and dry, no rash or lesion Psych- euthymic mood, full affect Neuro- strength and sensation are intact   Labs:   Lab Results  Component Value Date   WBC 6.0 08/22/2021   HGB 12.9 08/22/2021   HCT 39.5 08/22/2021   MCV 92.1 08/22/2021   PLT 220 08/22/2021    Recent Labs  Lab 09/24/21 0652  NA 140  K 4.2  CL 107  CO2 23  BUN 11  CREATININE 0.85  CALCIUM 9.0  GLUCOSE 109*     Discharge Medications:  Allergies as of 09/24/2021       Reactions   Tape    Other reaction(s): Other (See Comments) Causes skin to tear        Medication List     TAKE these medications    apixaban 5 MG Tabs tablet Commonly known as: Eliquis Take 1 tablet (5 mg total) by mouth 2 (two) times daily.  dofetilide 250 MCG capsule Commonly known as: TIKOSYN Take 1 capsule (250 mcg total) by mouth 2 (two) times daily.   ELDERBERRY PO Take 15 mLs by mouth at bedtime.   furosemide 20 MG tablet Commonly known as: LASIX Take 1 tablet (20 mg total) by mouth daily as needed for fluid.   loratadine 10 MG tablet Commonly known as: CLARITIN Take 10 mg by mouth at bedtime.   metoprolol succinate 50 MG 24 hr tablet Commonly known as: TOPROL-XL Take 25 mg by mouth in the morning and at bedtime. Take with or immediately following a meal.   metoprolol tartrate 50 MG tablet Commonly known as: LOPRESSOR Take 1 tablet (50 mg total) by mouth daily as needed (consistent heart greater than 120 (longer than an hour at  rest)).   multivitamin with minerals tablet Take 1 tablet by mouth daily.   pantoprazole 40 MG tablet Commonly known as: PROTONIX Take 1 tablet (40 mg total) by mouth daily.   vitamin C 1000 MG tablet Take 1,000 mg by mouth every evening.        Disposition: Home Discharge Instructions     Diet - low sodium heart healthy   Complete by: As directed    Increase activity slowly   Complete by: As directed         Duration of Discharge Encounter: Greater than 30 minutes including physician time.  Signed, Tommye Standard, PA-C 09/24/2021 11:01 AM  I have seen and examined this patient with Tommye Standard.  Agree with above, note added to reflect my findings.  Admitted for dofetilide load.  QTc remained stable.  Okay for discharge today.  GEN: Well nourished, well developed, in no acute distress  HEENT: normal  Neck: no JVD, carotid bruits, or masses Cardiac: RRR; no murmurs, rubs, or gallops,no edema  Respiratory:  clear to auscultation bilaterally, normal work of breathing GI: soft, nontender, nondistended, + BS MS: no deformity or atrophy  Skin: warm and dry Neuro:  Strength and sensation are intact Psych: euthymic mood, full affect   Persistent atrial fibrillation/atrial flutter/atrial tachycardia: QTc remained stable in sinus rhythm.  Tolerating dofetilide.  Plan for discharge today with follow-up in clinic. Morbid obesity: Diet and exercise encouraged  Massiel Stipp M. Zamiyah Resendes MD 09/24/2021 12:49 PM

## 2021-09-24 NOTE — Progress Notes (Signed)
Pharmacy: Dofetilide (Tikosyn) - Follow Up Assessment and Electrolyte Replacement  Pharmacy consulted to assist in monitoring and replacing electrolytes in this 56 y.o. female admitted on 09/21/2021 undergoing dofetilide initiation. First dofetilide dose: 09/21/21  Labs:    Component Value Date/Time   K 4.2 09/24/2021 0652   MG 1.9 09/24/2021 2924     Plan: Potassium: K >/= 4: No additional supplementation needed  Magnesium: Mg 1.8-2: Give Mg 2 gm IV x1    Thank you for allowing pharmacy to participate in this patient's care   Arrie Senate, PharmD, BCPS, [facility] Clinical Pharmacist [phone] Please check AMION for all [place] numbers 09/24/2021

## 2021-09-29 NOTE — Progress Notes (Signed)
Primary Care Physician: Raina Mina., MD Primary Cardiologist: Dr Aundra Dubin (remotely) Primary Electrophysiologist: Dr Curt Bears Referring Physician: Dr Alcide Clever is a 56 y.o. female with a history of paroxysmal atrial fibrillation, atrial flutter, atrial tachycardia, HLD who presents for follow up in the [facility].  The patient was initially diagnosed with atrial arrhythmias in 2014. She has been maintained on flecainide and metoprolol. Patient is not on anticoagulation for a CHADS2VASC score of 1. She was in her usual state of health until 08/15/19 when she presented to the ED with generalized fatigue for the last two weeks and was found to be in rapid atrial flutter. She was rate controlled and discharged. Patient states she was being treated for an URI with prednisone and abx prior to the onset of symptoms. Patient is s/p TEE/DCCV on 08/20/19. Unfortunately, she had reoccurrence of her atrial flutter on 08/26/19 and was cardioverted in the ER again. Patient is s/p DCCV on 03/31/20 and her flecainide was increased to 150 mg BID. Unfortunately, she had QRS widening on this dose and it had to be discontinued. She had two visits to the (ED 08/22/21 and 08/30/21) after stopping the medication with both atrial flutter and SVT.  On follow up today, patient is s/p dofetilide admission 6/6-09/24/21. Patient converted to SR with medication and did not require DCCV. Patient reports that she feels "much better" and has not had any interim heart racing. She is tolerating the medication without difficulty.   Today, she denies symptoms of palpitations, chest pain, orthopnea, PND, lower extremity edema, presyncope, syncope, bleeding. The patient is tolerating medications without difficulties and is otherwise without complaint today.    Atrial Fibrillation Risk Factors:  she does not have symptoms or diagnosis of sleep apnea. Negative sleep study 09/29/19. she does not have  a history of rheumatic fever. she does not have a history of alcohol use. The patient does not have a history of early familial atrial fibrillation or other arrhythmias.  she has a BMI of Body mass index is 54.28 kg/m.Marland Kitchen Filed Weights   09/30/21 0826  Weight: (!) 148 kg    Family History  Problem Relation Age of Onset   Colon cancer Mother    Rectal cancer Mother    Heart disease Other        several family members on dad's side have had heart issues - earliest in their 53's   Stroke Paternal Grandfather    Diabetes Paternal Grandfather    Breast cancer Sister    Colon polyps Sister        x 2   Stroke Paternal Grandmother    Diabetes Paternal Grandmother      Atrial Fibrillation Management history:  Previous antiarrhythmic drugs: flecainide, dofetilide  Previous cardioversions: 08/20/19, 08/26/19, 03/31/20 Previous ablations: none CHADS2VASC score: 1 Anticoagulation history: Eliquis   Past Medical History:  Diagnosis Date   Atrial tachycardia ([place])    a. Dx 03/2013.   Chest pain    a. 03/2013: cath negative for CAD, no PE by CT angio.   Dysrhythmia    Hyperglycemia    a. A1c 5.8 in 03/2013.   Hyperlipidemia    a. Monitored by PCP - elected to pursue lifestyle modification in 09/2012 with reassessment pending.   PAC (premature atrial contraction)    a. Dx 03/2013.   PAF (paroxysmal atrial fibrillation) ([place])    a. Dx 03/2013 - started on flecainide. CHADSVASC = 1 so no  anticoag at present time.   Paroxysmal atrial flutter ([place])    a. Dx 03/2013.   Pulmonary nodule, right    a. 76m RML by CT angio 03/2013.   [place]   Past Surgical History:  Procedure Laterality Date   APPENDECTOMY     CARDIOVERSION N/A 08/20/2019   Procedure: CARDIOVERSION;  Surgeon: CLelon Perla MD;  [facility];  Service: Cardiovascular;  Laterality: N/A;   CARDIOVERSION N/A 03/31/2020   Procedure: CARDIOVERSION;  Surgeon: CBuford Dresser MD;   [facility];  Service: Cardiovascular;  Laterality: N/A;   CHOLECYSTECTOMY     HERNIA REPAIR     LEFT HEART CATHETERIZATION WITH CORONARY ANGIOGRAM N/A 03/28/2013   Procedure: LEFT HEART CATHETERIZATION WITH CORONARY ANGIOGRAM;  Surgeon: JRamond Dial MD;  [facility];  Service: Cardiovascular;  Laterality: N/A;   RADICAL HYSTERECTOMY  06/10/2021   robotic   TEE WITHOUT CARDIOVERSION N/A 08/20/2019   Procedure: TRANSESOPHAGEAL ECHOCARDIOGRAM (TEE);  Surgeon: CLelon Perla MD;  [facility];  Service: Cardiovascular;  Laterality: N/A;    Current Outpatient Medications  Medication Sig Dispense Refill   apixaban (ELIQUIS) 5 MG TABS tablet Take 1 tablet (5 mg total) by mouth 2 (two) times daily. 180 tablet 3   Ascorbic Acid (VITAMIN C) 1000 MG tablet Take 1,000 mg by mouth every evening.     dofetilide (TIKOSYN) 250 MCG capsule Take 1 capsule (250 mcg total) by mouth 2 (two) times daily. 60 capsule 0   ELDERBERRY PO Take 15 mLs by mouth at bedtime.      furosemide (LASIX) 20 MG tablet Take 1 tablet (20 mg total) by mouth daily as needed for fluid. 30 tablet 3   loratadine (CLARITIN) 10 MG tablet Take 10 mg by mouth at bedtime.      metoprolol succinate (TOPROL-XL) 50 MG 24 hr tablet Take 25 mg by mouth in the morning and at bedtime. Take with or immediately following a meal. 90 tablet 3   metoprolol tartrate (LOPRESSOR) 50 MG tablet Take 1 tablet (50 mg total) by mouth daily as needed (consistent heart greater than 120 (longer than an hour at rest)). 60 tablet 6   Multiple Vitamins-Minerals (MULTIVITAMIN WITH MINERALS) tablet Take 1 tablet by mouth daily.     pantoprazole (PROTONIX) 40 MG tablet Take 1 tablet (40 mg total) by mouth daily. 90 tablet 3   No current facility-administered medications for this encounter.    Allergies  Allergen Reactions   Tape     Other reaction(s): Other (See Comments) Causes skin to tear    Social History    Socioeconomic History   Marital status: Divorced    Spouse name: Not on file   Number of children: 2   Years of education: Not on file   Highest education level: Not on file  Occupational History    Employer: [employer]  Tobacco Use   Smoking status: Never   Smokeless tobacco: Never   Tobacco comments:    Never smoke 09/21/21  Vaping Use   Vaping Use: Never used  Substance and Sexual Activity   Alcohol use: Not Currently   Drug use: No   Sexual activity: Not Currently  Other Topics Concern   Not on file  Social History Narrative   Not on file   Social Determinants of Health   Financial Resource Strain: Not on file  Food Insecurity: Not on file  Transportation Needs: Not on file  Physical Activity: Not on file  Stress: Not on file  Social Connections: Not on file  Intimate Partner Violence: Not on file     ROS- All systems are reviewed and negative except as per the HPI above.  Physical Exam: Vitals:   09/30/21 0826  BP: (!) 142/100  Pulse: 88  Weight: (!) 148 kg  Height: '5\' 5"'$  (1.651 m)     GEN- The patient is a well appearing obese female, alert and oriented x 3 today.   HEENT-head normocephalic, atraumatic, sclera clear, conjunctiva pink, hearing intact, trachea midline. Lungs- Clear to ausculation bilaterally, normal work of breathing Heart- Regular rate and rhythm, no murmurs, rubs or gallops  GI- soft, NT, ND, + BS Extremities- no clubbing, cyanosis, or edema MS- no significant deformity or atrophy Skin- no rash or lesion Psych- euthymic mood, full affect Neuro- strength and sensation are intact   Wt Readings from Last 3 Encounters:  09/30/21 (!) 148 kg  09/24/21 (!) 144.7 kg  09/21/21 (!) 147 kg    EKG today demonstrates  SR Vent. rate 88 BPM PR interval 148 ms QRS duration 80 ms QT/QTcB 396/479 ms  Echo 03/29/19 demonstrated   1. Left ventricular ejection fraction, by visual  estimation, is 60 to  65%. The left ventricle has normal function. There is no left ventricular  hypertrophy.   2. The left ventricle has no regional wall motion abnormalities.   3. Global right ventricle has normal systolic function.The right  ventricular size is normal. No increase in right ventricular wall  thickness.   4. Left atrial size was normal.   5. Right atrial size was normal.   6. The mitral valve is normal in structure. Trivial mitral valve  regurgitation. No evidence of mitral stenosis.   7. The tricuspid valve is normal in structure. Tricuspid valve  regurgitation is not demonstrated.   8. The aortic valve was not well visualized. Aortic valve regurgitation  is not visualized. Mild to moderate aortic valve sclerosis/calcification  without any evidence of aortic stenosis.   9. The pulmonic valve was normal in structure. Pulmonic valve  regurgitation is not visualized.  10. Normal pulmonary artery systolic pressure.  11. The inferior vena cava is normal in size with greater than 50%  respiratory variability, suggesting right atrial pressure of 3 mmHg.   Epic records are reviewed at length today  CHA2DS2-VASc Score = 1  The patient's score is based upon: CHF History: 0 HTN History: 0 Diabetes History: 0 Stroke History: 0 Vascular Disease History: 0 Age Score: 0 Gender Score: 1      ASSESSMENT AND PLAN: 1. Persistent Atrial Fibrillation/atrial flutter/atrial tachycardia The patient's CHA2DS2-VASc score is 1, indicating a 0.6% annual risk of stroke.   S/p dofetilide admission 6/6-09/24/21 Patient appears to be maintaining SR. Continue dofetilide 250 mcg BID. QT stable. Continue Eliquis 5 mg BID Check bmet/mag today. Continue Toprol 25 mg BID Continue Lopressor 50 mg q 6 hours for heart rate >100.   2. Obesity Body mass index is 54.28 kg/m. Lifestyle modification was discussed and encouraged including regular physical activity and weight  reduction.   Follow up with Dr Curt Bears as scheduled. AF clinic in 4 months.    [facility] [address] [place], [place] [address] [phone] 09/30/2021 8:41 AM

## 2021-09-30 ENCOUNTER — Encounter (HOSPITAL_COMMUNITY): Payer: Self-pay | Admitting: Physician Assistant

## 2021-09-30 ENCOUNTER — Ambulatory Visit (HOSPITAL_COMMUNITY)
Admit: 2021-09-30 | Discharge: 2021-09-30 | Disposition: A | Discharge status: Home / Self Care | Payer: Medicaid Other | Attending: Physician Assistant | Admitting: Physician Assistant

## 2021-09-30 VITALS — BP 142/100 | HR 88 | Ht 65.0 in | Wt 326.2 lb

## 2021-09-30 DIAGNOSIS — Z6841 Body Mass Index (BMI) 40.0 and over, adult: Secondary | ICD-10-CM | POA: Diagnosis not present

## 2021-09-30 DIAGNOSIS — E669 Obesity, unspecified: Secondary | ICD-10-CM | POA: Diagnosis not present

## 2021-09-30 DIAGNOSIS — I471 Supraventricular tachycardia: Secondary | ICD-10-CM | POA: Diagnosis not present

## 2021-09-30 DIAGNOSIS — Z7901 Long term (current) use of anticoagulants: Secondary | ICD-10-CM | POA: Diagnosis not present

## 2021-09-30 DIAGNOSIS — I4892 Unspecified atrial flutter: Secondary | ICD-10-CM | POA: Insufficient documentation

## 2021-09-30 DIAGNOSIS — Z79899 Other long term (current) drug therapy: Secondary | ICD-10-CM | POA: Insufficient documentation

## 2021-09-30 DIAGNOSIS — E785 Hyperlipidemia, unspecified: Secondary | ICD-10-CM | POA: Diagnosis not present

## 2021-09-30 DIAGNOSIS — I4819 Other persistent atrial fibrillation: Secondary | ICD-10-CM

## 2021-09-30 LAB — BASIC METABOLIC PANEL
Anion gap: 8 (ref 5–15)
BUN: 11 mg/dL (ref 6–20)
CO2: 24 mmol/L (ref 22–32)
Calcium: 8.6 mg/dL — ABNORMAL LOW (ref 8.9–10.3)
Chloride: 106 mmol/L (ref 98–111)
Creatinine, Ser: 0.99 mg/dL (ref 0.44–1.00)
GFR, Estimated: >60 mL/min (ref >60)
Glucose, Bld: 189 mg/dL — ABNORMAL HIGH (ref 70–99)
Potassium: 3.9 mmol/L (ref 3.5–5.1)
Sodium: 138 mmol/L (ref 135–145)

## 2021-09-30 LAB — MAGNESIUM: Magnesium: 1.9 mg/dL (ref 1.7–2.4)

## 2021-09-30 MED ORDER — DOFETILIDE 250 MCG PO CAPS: 250.0000 ug | ORAL_CAPSULE | Freq: Two times a day (BID) | ORAL | 1 refills | Status: DC

## 2021-10-01 ENCOUNTER — Other Ambulatory Visit (HOSPITAL_COMMUNITY): Payer: Self-pay | Admitting: *Deleted

## 2021-10-01 MED ORDER — POTASSIUM CHLORIDE ER 10 MEQ PO TBCR: 10.0000 meq | EXTENDED_RELEASE_TABLET | Freq: Every day | ORAL | 3 refills | Status: DC

## 2021-11-01 ENCOUNTER — Telehealth: Payer: Self-pay | Admitting: Cardiology

## 2021-11-01 NOTE — Telephone Encounter (Signed)
Pt reports 3 episodes since 6/29.  She has an apple watch that alerts her as well as symptoms.  6/29 at 10:45 pm -- out about 3 hours, took PRN Metoprolol.  July 7 at 8:00 pm -- out for 3 hours, took one PRN Metoprolol and was about to take another hour later but then went back into rhythm  2 episodes yesterday:       About 4pm -- HRs 120-130s last about 1 1/2 hours.  Took PRN Metoprolol.      11:22 pm laying in bed --  lasted about 45 min, HR high as 186, (avg 140-180s for about 45 min).  Took another PRN Metoprolol.  Hour later went back into rhythm  She also reports a lot of swelling in feet/ankles.  Today is better but she had trouble finding a shoe to fit last week. She took Lasix 4 days last week (along w/ K+) but edema wouldn't go down.  States yesterday is best ankles/feet have looked but "still very swollen".  She is also still experiencing SOB.  Just walking into work it takes about 20 minutes to recover.  Pt aware Dr. Curt Bears is out of the office this week.  Aware I am forwarding to AFib clinic to see if they can get her an OV for evaluation.  If they are unable then pt aware they will forward to our scheduler to see about an appt w/ EP APP. Patient verbalized understanding and agreeable to plan.

## 2021-11-01 NOTE — Telephone Encounter (Signed)
Pt c/o medication issue:  1. Name of Medication:   dofetilide (TIKOSYN) 250 MCG capsule    2. How are you currently taking this medication (dosage and times per day)? Take 1 capsule (250 mcg total) by mouth 2 (two) times daily.  3. Are you having a reaction (difficulty breathing--STAT)? No  4. What is your medication issue? Pt states that since taking medication she has been in Afib 4 times within the past week.  Pt c/o swelling: STAT is pt has developed SOB within 24 hours  If swelling, where is the swelling located? Hands, arms, legs and ankles  How much weight have you gained and in what time span? NA  Have you gained 3 pounds in a day or 5 pounds in a week? NA  Do you have a log of your daily weights (if so, list)? No  Are you currently taking a fluid pill? Yes  Are you currently SOB? No  Have you traveled recently? No

## 2021-11-01 NOTE — Telephone Encounter (Signed)
Pt appt made

## 2021-11-04 ENCOUNTER — Ambulatory Visit (HOSPITAL_COMMUNITY)
Admission: RE | Admit: 2021-11-04 | Discharge: 2021-11-04 | Disposition: A | Discharge status: Home / Self Care | Payer: Medicaid Other | Source: Ambulatory Visit | Attending: Physician Assistant | Admitting: Physician Assistant

## 2021-11-04 ENCOUNTER — Encounter (HOSPITAL_COMMUNITY): Payer: Self-pay | Admitting: Physician Assistant

## 2021-11-04 VITALS — BP 128/90 | HR 81 | Ht 65.0 in | Wt 329.8 lb

## 2021-11-04 DIAGNOSIS — I4892 Unspecified atrial flutter: Secondary | ICD-10-CM | POA: Insufficient documentation

## 2021-11-04 DIAGNOSIS — Z6841 Body Mass Index (BMI) 40.0 and over, adult: Secondary | ICD-10-CM | POA: Diagnosis not present

## 2021-11-04 DIAGNOSIS — E669 Obesity, unspecified: Secondary | ICD-10-CM | POA: Diagnosis not present

## 2021-11-04 DIAGNOSIS — I501 Left ventricular failure: Secondary | ICD-10-CM | POA: Diagnosis not present

## 2021-11-04 DIAGNOSIS — E785 Hyperlipidemia, unspecified: Secondary | ICD-10-CM | POA: Insufficient documentation

## 2021-11-04 DIAGNOSIS — I480 Paroxysmal atrial fibrillation: Secondary | ICD-10-CM | POA: Diagnosis not present

## 2021-11-04 DIAGNOSIS — Z7901 Long term (current) use of anticoagulants: Secondary | ICD-10-CM | POA: Diagnosis not present

## 2021-11-04 DIAGNOSIS — I471 Supraventricular tachycardia: Secondary | ICD-10-CM | POA: Diagnosis not present

## 2021-11-04 DIAGNOSIS — I4819 Other persistent atrial fibrillation: Secondary | ICD-10-CM | POA: Insufficient documentation

## 2021-11-04 LAB — BASIC METABOLIC PANEL
Anion gap: 9 (ref 5–15)
BUN: 12 mg/dL (ref 6–20)
CO2: 24 mmol/L (ref 22–32)
Calcium: 8.4 mg/dL — ABNORMAL LOW (ref 8.9–10.3)
Chloride: 106 mmol/L (ref 98–111)
Creatinine, Ser: 0.89 mg/dL (ref 0.44–1.00)
GFR, Estimated: >60 mL/min (ref >60)
Glucose, Bld: 145 mg/dL — ABNORMAL HIGH (ref 70–99)
Potassium: 4 mmol/L (ref 3.5–5.1)
Sodium: 139 mmol/L (ref 135–145)

## 2021-11-04 LAB — BRAIN NATRIURETIC PEPTIDE: B Natriuretic Peptide: 48 pg/mL (ref 0.0–100.0)

## 2021-11-04 MED ORDER — METOPROLOL SUCCINATE ER 50 MG PO TB24: 50.0000 mg | ORAL_TABLET | Freq: Two times a day (BID) | ORAL | 3 refills | Status: DC

## 2021-11-04 NOTE — Patient Instructions (Signed)
Increase metoprolol to '50mg'$  twice a day  Increase lasix (furosemide) to '40mg'$  for 3 days then back to normal dosing  Take 2 potassium tablets for 3 days

## 2021-11-04 NOTE — Progress Notes (Signed)
Primary Care Physician: Raina Mina., MD Primary Cardiologist: Dr Aundra Dubin (remotely) Primary Electrophysiologist: Dr Curt Bears Referring Physician: Dr Alcide Clever is a 56 y.o. female with a history of paroxysmal atrial fibrillation, atrial flutter, atrial tachycardia, HLD who presents for follow up in the [facility].  The patient was initially diagnosed with atrial arrhythmias in 2014. She has been maintained on flecainide and metoprolol. Patient is not on anticoagulation for a CHADS2VASC score of 1. She was in her usual state of health until 08/15/19 when she presented to the ED with generalized fatigue for the last two weeks and was found to be in rapid atrial flutter. She was rate controlled and discharged. Patient states she was being treated for an URI with prednisone and abx prior to the onset of symptoms. Patient is s/p TEE/DCCV on 08/20/19. Unfortunately, she had reoccurrence of her atrial flutter on 08/26/19 and was cardioverted in the ER again. Patient is s/p DCCV on 03/31/20 and her flecainide was increased to 150 mg BID. Unfortunately, she had QRS widening on this dose and it had to be discontinued. She had two visits to the (ED 08/22/21 and 08/30/21) after stopping the medication with both atrial flutter and SVT.  Patient is s/p dofetilide admission 6/6-09/24/21. Patient converted to SR with medication and did not require DCCV.   On follow up today, patient has had at least 3 episodes of tachypalpitations since her last visit. She has taken her PRN BB which resolves the symptoms in about 2 hours. She has also noted increased lower extremity edema, weight gain, orthopnea, and SOB with exertion. She has taken her PRN Lasix which has helped but not resolved her symptoms.   Today, she denies symptoms of chest pain, PND, presyncope, syncope, bleeding. The patient is tolerating medications without difficulties and is otherwise without complaint today.     Atrial Fibrillation Risk Factors:  she does not have symptoms or diagnosis of sleep apnea. Negative sleep study 09/29/19. she does not have a history of rheumatic fever. she does not have a history of alcohol use. The patient does not have a history of early familial atrial fibrillation or other arrhythmias.  she has a BMI of Body mass index is 54.88 kg/m.Marland Kitchen Filed Weights   11/04/21 1055  Weight: (!) 149.6 kg    Family History  Problem Relation Age of Onset   Colon cancer Mother    Rectal cancer Mother    Heart disease Other        several family members on dad's side have had heart issues - earliest in their 2's   Stroke Paternal Grandfather    Diabetes Paternal Grandfather    Breast cancer Sister    Colon polyps Sister        x 2   Stroke Paternal Grandmother    Diabetes Paternal Grandmother      Atrial Fibrillation Management history:  Previous antiarrhythmic drugs: flecainide, dofetilide  Previous cardioversions: 08/20/19, 08/26/19, 03/31/20 Previous ablations: none CHADS2VASC score: 2 Anticoagulation history: Eliquis   Past Medical History:  Diagnosis Date   Atrial tachycardia ([place])    a. Dx 03/2013.   Chest pain    a. 03/2013: cath negative for CAD, no PE by CT angio.   Dysrhythmia    Hyperglycemia    a. A1c 5.8 in 03/2013.   Hyperlipidemia    a. Monitored by PCP - elected to pursue lifestyle modification in 09/2012 with reassessment pending.   PAC (  premature atrial contraction)    a. Dx 03/2013.   PAF (paroxysmal atrial fibrillation) ([place])    a. Dx 03/2013 - started on flecainide. CHADSVASC = 1 so no anticoag at present time.   Paroxysmal atrial flutter ([place])    a. Dx 03/2013.   Pulmonary nodule, right    a. 94m RML by CT angio 03/2013.   [place] 05[place]   Past Surgical History:  Procedure Laterality Date   APPENDECTOMY     CARDIOVERSION N/A 08/20/2019   Procedure: CARDIOVERSION;  Surgeon: CLelon Perla MD;  [facility];  Service: Cardiovascular;  Laterality: N/A;   CARDIOVERSION N/A 03/31/2020   Procedure: CARDIOVERSION;  Surgeon: CBuford Dresser MD;  [facility];  Service: Cardiovascular;  Laterality: N/A;   CHOLECYSTECTOMY     HERNIA REPAIR     LEFT HEART CATHETERIZATION WITH CORONARY ANGIOGRAM N/A 03/28/2013   Procedure: LEFT HEART CATHETERIZATION WITH CORONARY ANGIOGRAM;  Surgeon: JRamond Dial MD;  [facility];  Service: Cardiovascular;  Laterality: N/A;   RADICAL HYSTERECTOMY  06/10/2021   robotic   TEE WITHOUT CARDIOVERSION N/A 08/20/2019   Procedure: TRANSESOPHAGEAL ECHOCARDIOGRAM (TEE);  Surgeon: CLelon Perla MD;  [facility];  Service: Cardiovascular;  Laterality: N/A;    Current Outpatient Medications  Medication Sig Dispense Refill   apixaban (ELIQUIS) 5 MG TABS tablet Take 1 tablet (5 mg total) by mouth 2 (two) times daily. 180 tablet 3   dofetilide (TIKOSYN) 250 MCG capsule Take 1 capsule (250 mcg total) by mouth 2 (two) times daily. 180 capsule 1   furosemide (LASIX) 20 MG tablet Take 1 tablet (20 mg total) by mouth daily as needed for fluid. 30 tablet 3   loratadine (CLARITIN) 10 MG tablet Take 10 mg by mouth at bedtime.      metoprolol tartrate (LOPRESSOR) 50 MG tablet Take 1 tablet (50 mg total) by mouth daily as needed (consistent heart greater than 120 (longer than an hour at rest)). 60 tablet 6   Multiple Vitamins-Minerals (MULTIVITAMIN WITH MINERALS) tablet Take 1 tablet by mouth daily.     pantoprazole (PROTONIX) 40 MG tablet Take 1 tablet (40 mg total) by mouth daily. 90 tablet 3   potassium chloride (KLOR-CON) 10 MEQ tablet Take 1 tablet (10 mEq total) by mouth daily. 90 tablet 3   metoprolol succinate (TOPROL-XL) 50 MG 24 hr tablet Take 1 tablet (50 mg total) by mouth 2 (two) times daily. 90 tablet 3   No current facility-administered medications for this encounter.    Allergies  Allergen Reactions   Tape      Other reaction(s): Other (See Comments) Causes skin to tear    Social History   Socioeconomic History   Marital status: Divorced    Spouse name: Not on file   Number of children: 2   Years of education: Not on file   Highest education level: Not on file  Occupational History    Employer: [employer]  Tobacco Use   Smoking status: Never   Smokeless tobacco: Never   Tobacco comments:    Never smoke 09/21/21  Vaping Use   Vaping Use: Never used  Substance and Sexual Activity   Alcohol use: Not Currently   Drug use: No   Sexual activity: Not Currently  Other Topics Concern   Not on file  Social History Narrative   Not on file   Social Determinants of Health  Financial Resource Strain: Not on file  Food Insecurity: Not on file  Transportation Needs: Not on file  Physical Activity: Not on file  Stress: Not on file  Social Connections: Not on file  Intimate Partner Violence: Not on file     ROS- All systems are reviewed and negative except as per the HPI above.  Physical Exam: Vitals:   11/04/21 1055  BP: 128/90  Pulse: 81  Weight: (!) 149.6 kg  Height: '5\' 5"'$  (1.651 m)     GEN- The patient is a well appearing obese female, alert and oriented x 3 today.   HEENT-head normocephalic, atraumatic, sclera clear, conjunctiva pink, hearing intact, trachea midline. Lungs- Clear to ausculation bilaterally, normal work of breathing Heart- Regular rate and rhythm, no murmurs, rubs or gallops  GI- soft, NT, ND, + BS Extremities- no clubbing, cyanosis, 1+ bilateral lower extremity edema MS- no significant deformity or atrophy Skin- no rash or lesion Psych- euthymic mood, full affect Neuro- strength and sensation are intact   Wt Readings from Last 3 Encounters:  11/04/21 (!) 149.6 kg  09/30/21 (!) 148 kg  09/24/21 (!) 144.7 kg    EKG today demonstrates  SR Vent. rate 81 BPM PR interval 152 ms QRS duration 78  ms QT/QTcB 428/497 ms (480-490 measured manually)  Echo 03/29/19 demonstrated   1. Left ventricular ejection fraction, by visual estimation, is 60 to  65%. The left ventricle has normal function. There is no left ventricular  hypertrophy.   2. The left ventricle has no regional wall motion abnormalities.   3. Global right ventricle has normal systolic function.The right  ventricular size is normal. No increase in right ventricular wall  thickness.   4. Left atrial size was normal.   5. Right atrial size was normal.   6. The mitral valve is normal in structure. Trivial mitral valve  regurgitation. No evidence of mitral stenosis.   7. The tricuspid valve is normal in structure. Tricuspid valve  regurgitation is not demonstrated.   8. The aortic valve was not well visualized. Aortic valve regurgitation  is not visualized. Mild to moderate aortic valve sclerosis/calcification  without any evidence of aortic stenosis.   9. The pulmonic valve was normal in structure. Pulmonic valve  regurgitation is not visualized.  10. Normal pulmonary artery systolic pressure.  11. The inferior vena cava is normal in size with greater than 50%  respiratory variability, suggesting right atrial pressure of 3 mmHg.   Epic records are reviewed at length today  CHA2DS2-VASc Score = 2  The patient's score is based upon: CHF History: 1 HTN History: 0 Diabetes History: 0 Stroke History: 0 Vascular Disease History: 0 Age Score: 0 Gender Score: 1        ASSESSMENT AND PLAN: 1. Persistent Atrial Fibrillation/atrial flutter/atrial tachycardia The patient's CHA2DS2-VASc score is 2, indicating a 2.2% annual risk of stroke.   S/p dofetilide admission 6/6-09/24/21 Patient still having frequent episodes. Continue dofetilide 250 mcg BID. QT stable. Will increase Toprol to 50 mg BID given that her episodes respond well to PRN BB.  Continue Eliquis 5 mg BID Check echocardiogram  Continue Lopressor 50 mg q 6  hours for heart rate >100.   2. Obesity Body mass index is 54.88 kg/m. Lifestyle modification was discussed and encouraged including regular physical activity and weight reduction.  3. Acute CHF Patient having symptoms of SOB on exertion, edema, and orthopnea. NYHA class II-III symptoms. Check echo as above. Check bmet/BNP today Increase lasix to  40 mg daily x 3 days, also take KCL 20 meq with Lasix. We also discussed Alleviate HF study, she is interested, will forward to research team.    Follow up with Dr Curt Bears as scheduled.    [facility] [address] [place], [place] [address] [phone] 11/04/2021 11:32 AM

## 2021-11-15 ENCOUNTER — Ambulatory Visit (HOSPITAL_COMMUNITY)
Admission: RE | Admit: 2021-11-15 | Discharge: 2021-11-15 | Disposition: A | Discharge status: Home / Self Care | Payer: Medicaid Other | Source: Ambulatory Visit | Attending: Physician Assistant | Admitting: Physician Assistant

## 2021-11-15 DIAGNOSIS — R079 Chest pain, unspecified: Secondary | ICD-10-CM | POA: Diagnosis not present

## 2021-11-15 DIAGNOSIS — E785 Hyperlipidemia, unspecified: Secondary | ICD-10-CM | POA: Insufficient documentation

## 2021-11-15 DIAGNOSIS — I480 Paroxysmal atrial fibrillation: Secondary | ICD-10-CM | POA: Insufficient documentation

## 2021-11-15 DIAGNOSIS — I10 Essential (primary) hypertension: Secondary | ICD-10-CM | POA: Insufficient documentation

## 2021-11-15 DIAGNOSIS — I3139 Other pericardial effusion (noninflammatory): Secondary | ICD-10-CM | POA: Diagnosis not present

## 2021-11-15 LAB — ECHOCARDIOGRAM COMPLETE
Area-P 1/2: 3.5 cm2
S' Lateral: 3.7 cm

## 2021-11-16 ENCOUNTER — Encounter (HOSPITAL_COMMUNITY): Payer: Self-pay | Admitting: *Deleted

## 2021-11-22 ENCOUNTER — Ambulatory Visit (INDEPENDENT_AMBULATORY_CARE_PROVIDER_SITE_OTHER): Payer: Medicaid Other | Admitting: Cardiology

## 2021-11-22 ENCOUNTER — Encounter: Payer: Self-pay | Admitting: Cardiology

## 2021-11-22 VITALS — BP 118/72 | HR 87 | Ht 64.5 in | Wt 319.0 lb

## 2021-11-22 DIAGNOSIS — I4819 Other persistent atrial fibrillation: Secondary | ICD-10-CM | POA: Diagnosis not present

## 2021-11-22 NOTE — Progress Notes (Signed)
Electrophysiology Office Note   Date:  11/22/2021   ID:  Casandra, Dallaire Apr 27, 1965, MRN [id number]  PCP:  Raina Mina., MD  Cardiologist:  Aundra Dubin Primary Electrophysiologist:  Kaitlin Ardito Meredith Leeds, MD    No chief complaint on file.     History of Present Illness: KARYS MECKLEY is a 56 y.o. female who is being seen today for the evaluation of atrial flutter/fibrillation at the request of Raina Mina., MD. Presenting today for electrophysiology evaluation.    He has a history of similar atrial fibrillation, atrial flutter, atrial tachycardia, sleep apnea, morbid obesity.  She was initially on flecainide but had QRS widening.  She had stress testing that showed inferolateral ischemia but had normal coronaries on catheterization.  She is now on dofetilide.  Today, denies symptoms of palpitations, chest pain, shortness of breath, orthopnea, PND, lower extremity edema, claudication, dizziness, presyncope, syncope, bleeding, or neurologic sequela. The patient is tolerating medications without difficulties.  Since being loaded on dofetilide she has done well.  She has had no chest pain or shortness of breath.  She Zalayah Pizzuto do all of her daily activities.  She has had a few episodes of atrial fibrillation relieved with as needed metoprolol.   Past Medical History:  Diagnosis Date   Atrial tachycardia ([place])    a. Dx 03/2013.   Chest pain    a. 03/2013: cath negative for CAD, no PE by CT angio.   Dysrhythmia    Hyperglycemia    a. A1c 5.8 in 03/2013.   Hyperlipidemia    a. Monitored by PCP - elected to pursue lifestyle modification in 09/2012 with reassessment pending.   PAC (premature atrial contraction)    a. Dx 03/2013.   PAF (paroxysmal atrial fibrillation) ([place])    a. Dx 03/2013 - started on flecainide. CHADSVASC = 1 so no anticoag at present time.   Paroxysmal atrial flutter ([place])    a. Dx 03/2013.   Pulmonary nodule, right    a. 29m RML by CT angio 03/2013.   [place]ine  cancer ([place] 05/[place]   Past Surgical History:  Procedure Laterality Date   APPENDECTOMY     CARDIOVERSION N/A 08/20/2019   Procedure: CARDIOVERSION;  Surgeon: CLelon Perla MD;  [facility];  Service: Cardiovascular;  Laterality: N/A;   CARDIOVERSION N/A 03/31/2020   Procedure: CARDIOVERSION;  Surgeon: CBuford Dresser MD;  [facility];  Service: Cardiovascular;  Laterality: N/A;   CHOLECYSTECTOMY     HERNIA REPAIR     LEFT HEART CATHETERIZATION WITH CORONARY ANGIOGRAM N/A 03/28/2013   Procedure: LEFT HEART CATHETERIZATION WITH CORONARY ANGIOGRAM;  Surgeon: JRamond Dial MD;  [facility];  Service: Cardiovascular;  Laterality: N/A;   RADICAL HYSTERECTOMY  06/10/2021   robotic   TEE WITHOUT CARDIOVERSION N/A 08/20/2019   Procedure: TRANSESOPHAGEAL ECHOCARDIOGRAM (TEE);  Surgeon: CLelon Perla MD;  [facility];  Service: Cardiovascular;  Laterality: N/A;     Current Outpatient Medications  Medication Sig Dispense Refill   apixaban (ELIQUIS) 5 MG TABS tablet Take 1 tablet (5 mg total) by mouth 2 (two) times daily. 180 tablet 3   dofetilide (TIKOSYN) 250 MCG capsule Take 1 capsule (250 mcg total) by mouth 2 (two) times daily. 180 capsule 1   furosemide (LASIX) 20 MG tablet Take 20 mg by mouth daily.     loratadine (CLARITIN) 10 MG tablet Take 10 mg by mouth at bedtime.      metoprolol succinate (TOPROL-XL)  50 MG 24 hr tablet Take 1 tablet (50 mg total) by mouth 2 (two) times daily. 90 tablet 3   metoprolol tartrate (LOPRESSOR) 50 MG tablet Take 1 tablet (50 mg total) by mouth daily as needed (consistent heart greater than 120 (longer than an hour at rest)). 60 tablet 6   Multiple Vitamins-Minerals (MULTIVITAMIN WITH MINERALS) tablet Take 1 tablet by mouth daily.     pantoprazole (PROTONIX) 40 MG tablet Take 1 tablet (40 mg total) by mouth daily. 90 tablet 3   potassium chloride (KLOR-CON) 10 MEQ tablet Take 1 tablet (10 mEq  total) by mouth daily. 90 tablet 3   No current facility-administered medications for this visit.    Allergies:   Tape   Social History:  The patient  reports that she has never smoked. She has never used smokeless tobacco. She reports that she does not currently use alcohol. She reports that she does not use drugs.   Family History:  The patient's family history includes Breast cancer in her sister; Colon cancer in her mother; Colon polyps in her sister; Diabetes in her paternal grandfather and paternal grandmother; Heart disease in an other family member; Rectal cancer in her mother; Stroke in her paternal grandfather and paternal grandmother.   ROS:  Please see the history of present illness.   Otherwise, review of systems is positive for none.   All other systems are reviewed and negative.   PHYSICAL EXAM: VS:  BP 118/72   Pulse 87   Ht 5' 4.5" (1.638 m)   Wt (!) 319 lb (144.7 kg)   LMP 11/02/2019   SpO2 94%   BMI 53.91 kg/m  , BMI Body mass index is 53.91 kg/m. GEN: Well nourished, well developed, in no acute distress  HEENT: normal  Neck: no JVD, carotid bruits, or masses Cardiac: RRR; no murmurs, rubs, or gallops,no edema  Respiratory:  clear to auscultation bilaterally, normal work of breathing GI: soft, nontender, nondistended, + BS MS: no deformity or atrophy  Skin: warm and dry Neuro:  Strength and sensation are intact Psych: euthymic mood, full affect  EKG:  EKG is ordered today. Personal review of the ekg ordered shows sinus rhythm  Recent Labs: 08/09/2021: NT-Pro BNP <36 08/22/2021: ALT 23; Hemoglobin 12.9; Platelets 220 08/31/2021: TSH 1.910 09/30/2021: Magnesium 1.9 11/04/2021: B Natriuretic Peptide 48.0; BUN 12; Creatinine, Ser 0.89; Potassium 4.0; Sodium 139    Lipid Panel     Component Value Date/Time   CHOL 152 12/05/2014 1216   TRIG 203.0 (H) 12/05/2014 1216   HDL 32.30 (L) 12/05/2014 1216   CHOLHDL 5 12/05/2014 1216   VLDL 40.6 (H) 12/05/2014 1216    LDLCALC 72 10/07/2014 0838   LDLDIRECT 87.0 12/05/2014 1216     Wt Readings from Last 3 Encounters:  11/22/21 (!) 319 lb (144.7 kg)  11/04/21 (!) 329 lb 12.8 oz (149.6 kg)  09/30/21 (!) 326 lb 3.2 oz (148 kg)      Other studies Reviewed: Additional studies/ records that were reviewed today include: TTE 11/05/21 Review of the above records today demonstrates:   1. Left ventricular ejection fraction, by estimation, is 60 to 65%. The  left ventricle has normal function. Left ventricular endocardial border  not optimally defined to evaluate regional wall motion. Left ventricular  diastolic parameters were normal.   2. Right ventricular systolic function is normal. The right ventricular  size is normal. Tricuspid regurgitation signal is inadequate for assessing  PA pressure.   3. A small  pericardial effusion is present. The pericardial effusion is  circumferential.   4. The mitral valve is grossly normal. Trivial mitral valve  regurgitation. No evidence of mitral stenosis.   5. The aortic valve was not well visualized. Aortic valve regurgitation  is not visualized. No aortic stenosis is present.    ASSESSMENT AND PLAN:  1.  Persistent atrial fibrillation/atrial flutter/atrial tachycardia: Status post admission for dofetilide.  High risk medication monitoring for dofetilide.  CHA2DS2-VASc of 1.  Currently on Eliquis 5 mg twice daily.  She has had a few episodes of tachypalpitations, but have resolved after as needed beta-blocker.  Her metoprolol was increased.  She has had no further episodes.  She is feeling some fatigue that could be due to her beta-blocker.  I have asked her to continue to try her medications at the current doses for now, but may need to consolidate in the future.  2.  Morbid obesity: Lifestyle modification encouraged Body mass index is 53.91 kg/m.    Current medicines are reviewed at length with the patient today.   The patient does not have concerns  regarding her medicines.  The following changes were made today: None  Labs/ tests ordered today include:  Orders Placed This Encounter  Procedures   EKG 12-Lead      Disposition:   FU with Drevion Offord 6 months  Signed, Lio Wehrly Meredith Leeds, MD  11/22/2021 9:32 AM     [address] [place] [address] [phone] (office) [phone] (fax)

## 2021-11-22 NOTE — Patient Instructions (Signed)
Medication Instructions:  Your physician recommends that you continue on your current medications as directed. Please refer to the Current Medication list given to you today.  *If you need a refill on your cardiac medications before your next appointment, please call your pharmacy*   Lab Work: None ordered.  If you have labs (blood work) drawn today and your tests are completely normal, you will receive your results only by: [place] (if you have MyChart) OR A paper copy in the mail If you have any lab test that is abnormal or we need to change your treatment, we will call you to review the results.   Testing/Procedures: None ordered.    Follow-Up: At [facility], you and your health needs are our priority.  As part of our continuing mission to provide you with exceptional heart care, we have created designated Provider Care Teams.  These Care Teams include your primary Cardiologist (physician) and Advanced Practice Providers (APPs -  Physician Assistants and Nurse Practitioners) who all work together to provide you with the care you need, when you need it.  We recommend signing up for the patient portal called "MyChart".  Sign up information is provided on this After Visit Summary.  MyChart is used to connect with patients for Virtual Visits (Telemedicine).  Patients are able to view lab/test results, encounter notes, upcoming appointments, etc.  Non-urgent messages can be sent to your provider as well.   To learn more about what you can do with MyChart, go to [email or web address].    Your next appointment:   6 months with Dr Curt Bears :1}    Other Instructions OK to take Ozempic  Important Information About Sugar

## 2022-02-03 ENCOUNTER — Inpatient Hospital Stay (HOSPITAL_COMMUNITY): Admission: RE | Admit: 2022-02-03 | Payer: Medicaid Other | Source: Ambulatory Visit | Admitting: Physician Assistant

## 2022-02-10 ENCOUNTER — Other Ambulatory Visit (HOSPITAL_COMMUNITY): Payer: Self-pay

## 2022-02-10 MED ORDER — FUROSEMIDE 20 MG PO TABS: 20.0000 mg | ORAL_TABLET | Freq: Every day | ORAL | 1 refills | Status: AC

## 2022-02-25 ENCOUNTER — Encounter (HOSPITAL_COMMUNITY): Payer: Self-pay | Admitting: Physician Assistant

## 2022-02-25 ENCOUNTER — Ambulatory Visit (HOSPITAL_COMMUNITY)
Admission: RE | Admit: 2022-02-25 | Discharge: 2022-02-25 | Disposition: A | Discharge status: Home / Self Care | Payer: Medicaid Other | Source: Ambulatory Visit | Attending: Physician Assistant | Admitting: Physician Assistant

## 2022-02-25 VITALS — BP 126/76 | HR 83 | Ht 64.5 in | Wt 326.0 lb

## 2022-02-25 DIAGNOSIS — I4892 Unspecified atrial flutter: Secondary | ICD-10-CM | POA: Diagnosis not present

## 2022-02-25 DIAGNOSIS — E785 Hyperlipidemia, unspecified: Secondary | ICD-10-CM | POA: Insufficient documentation

## 2022-02-25 DIAGNOSIS — M7989 Other specified soft tissue disorders: Secondary | ICD-10-CM | POA: Insufficient documentation

## 2022-02-25 DIAGNOSIS — E669 Obesity, unspecified: Secondary | ICD-10-CM | POA: Insufficient documentation

## 2022-02-25 DIAGNOSIS — I4719 Other supraventricular tachycardia: Secondary | ICD-10-CM | POA: Diagnosis not present

## 2022-02-25 DIAGNOSIS — Z79899 Other long term (current) drug therapy: Secondary | ICD-10-CM | POA: Insufficient documentation

## 2022-02-25 DIAGNOSIS — Z7901 Long term (current) use of anticoagulants: Secondary | ICD-10-CM | POA: Diagnosis not present

## 2022-02-25 DIAGNOSIS — Z6841 Body Mass Index (BMI) 40.0 and over, adult: Secondary | ICD-10-CM | POA: Diagnosis not present

## 2022-02-25 DIAGNOSIS — I4819 Other persistent atrial fibrillation: Secondary | ICD-10-CM | POA: Diagnosis present

## 2022-02-25 DIAGNOSIS — R600 Localized edema: Secondary | ICD-10-CM | POA: Diagnosis not present

## 2022-02-25 LAB — BASIC METABOLIC PANEL
Anion gap: 14 (ref 5–15)
BUN: 11 mg/dL (ref 6–20)
CO2: 24 mmol/L (ref 22–32)
Calcium: 9.3 mg/dL (ref 8.9–10.3)
Chloride: 103 mmol/L (ref 98–111)
Creatinine, Ser: 0.85 mg/dL (ref 0.44–1.00)
GFR, Estimated: >60 mL/min (ref >60)
Glucose, Bld: 147 mg/dL — ABNORMAL HIGH (ref 70–99)
Potassium: 4.3 mmol/L (ref 3.5–5.1)
Sodium: 141 mmol/L (ref 135–145)

## 2022-02-25 LAB — BRAIN NATRIURETIC PEPTIDE: B Natriuretic Peptide: 29.3 pg/mL (ref 0.0–100.0)

## 2022-02-25 LAB — MAGNESIUM: Magnesium: 1.9 mg/dL (ref 1.7–2.4)

## 2022-02-25 MED ORDER — METOPROLOL SUCCINATE ER 50 MG PO TB24: 75.0000 mg | ORAL_TABLET | Freq: Two times a day (BID) | ORAL | Status: DC

## 2022-02-25 MED ORDER — METOPROLOL TARTRATE 50 MG PO TABS: 50.0000 mg | ORAL_TABLET | Freq: Every day | ORAL | 1 refills | Status: DC | PRN

## 2022-02-25 NOTE — Progress Notes (Signed)
Primary Care Physician: Raina Mina., MD Primary Cardiologist: Dr Aundra Dubin (remotely) Primary Electrophysiologist: Dr Curt Bears Referring Physician: Dr Alcide Clever is a 56 y.o. female with a history of paroxysmal atrial fibrillation, atrial flutter, atrial tachycardia, HLD who presents for follow up in the [facility].  The patient was initially diagnosed with atrial arrhythmias in 2014. She has been maintained on flecainide and metoprolol. Patient is not on anticoagulation for a CHADS2VASC score of 1. She was in her usual state of health until 08/15/19 when she presented to the ED with generalized fatigue for the last two weeks and was found to be in rapid atrial flutter. She was rate controlled and discharged. Patient states she was being treated for an URI with prednisone and abx prior to the onset of symptoms. Patient is s/p TEE/DCCV on 08/20/19. Unfortunately, she had reoccurrence of her atrial flutter on 08/26/19 and was cardioverted in the ER again. Patient is s/p DCCV on 03/31/20 and her flecainide was increased to 150 mg BID. Unfortunately, she had QRS widening on this dose and it had to be discontinued. She had two visits to the (ED 08/22/21 and 08/30/21) after stopping the medication with both atrial flutter and SVT.  Patient is s/p dofetilide admission 6/6-09/24/21. Patient converted to SR with medication and did not require DCCV.   On follow up today, patient reports that during the month of October she has as many as 10 episodes of afib with rapid heart rates. In one instance, she took 3 doses of her PRN BB before she converted back to SR. There were no specific triggers for her episodes that she could identify. She also notes that she has intermittent swelling of her hands and feet and has been taking Lasix daily.   Today, she denies symptoms of chest pain, PND, presyncope, syncope, bleeding. The patient is tolerating medications without difficulties  and is otherwise without complaint today.    Atrial Fibrillation Risk Factors:  she does not have symptoms or diagnosis of sleep apnea. Negative sleep study 09/29/19. she does not have a history of rheumatic fever. she does not have a history of alcohol use. The patient does not have a history of early familial atrial fibrillation or other arrhythmias.  she has a BMI of Body mass index is 55.09 kg/m.Marland Kitchen Filed Weights   02/25/22 1104  Weight: (!) 147.9 kg    Family History  Problem Relation Age of Onset   Colon cancer Mother    Rectal cancer Mother    Heart disease Other        several family members on dad's side have had heart issues - earliest in their 22's   Stroke Paternal Grandfather    Diabetes Paternal Grandfather    Breast cancer Sister    Colon polyps Sister        x 2   Stroke Paternal Grandmother    Diabetes Paternal Grandmother      Atrial Fibrillation Management history:  Previous antiarrhythmic drugs: flecainide, dofetilide  Previous cardioversions: 08/20/19, 08/26/19, 03/31/20 Previous ablations: none CHADS2VASC score: 2 Anticoagulation history: Eliquis   Past Medical History:  Diagnosis Date   Atrial tachycardia    a. Dx 03/2013.   Chest pain    a. 03/2013: cath negative for CAD, no PE by CT angio.   Dysrhythmia    Hyperglycemia    a. A1c 5.8 in 03/2013.   Hyperlipidemia    a. Monitored by PCP - elected  to pursue lifestyle modification in 09/2012 with reassessment pending.   PAC (premature atrial contraction)    a. Dx 03/2013.   PAF (paroxysmal atrial fibrillation) ([place])    a. Dx 03/2013 - started on flecainide. CHADSVASC = 1 so no anticoag at present time.   Paroxysmal atrial flutter ([place])    a. Dx 03/2013.   Pulmonary nodule, right    a. 28m RML by CT angio 03/2013.   [place]terine cancer ([place] 05/20[place]   Past Surgical History:  Procedure Laterality Date   APPENDECTOMY     CARDIOVERSION N/A 08/20/2019   Procedure: CARDIOVERSION;  Surgeon:  CLelon Perla MD;  [facility];  Service: Cardiovascular;  Laterality: N/A;   CARDIOVERSION N/A 03/31/2020   Procedure: CARDIOVERSION;  Surgeon: CBuford Dresser MD;  [facility];  Service: Cardiovascular;  Laterality: N/A;   CHOLECYSTECTOMY     HERNIA REPAIR     LEFT HEART CATHETERIZATION WITH CORONARY ANGIOGRAM N/A 03/28/2013   Procedure: LEFT HEART CATHETERIZATION WITH CORONARY ANGIOGRAM;  Surgeon: JRamond Dial MD;  [facility];  Service: Cardiovascular;  Laterality: N/A;   RADICAL HYSTERECTOMY  06/10/2021   robotic   TEE WITHOUT CARDIOVERSION N/A 08/20/2019   Procedure: TRANSESOPHAGEAL ECHOCARDIOGRAM (TEE);  Surgeon: CLelon Perla MD;  [facility];  Service: Cardiovascular;  Laterality: N/A;    Current Outpatient Medications  Medication Sig Dispense Refill   apixaban (ELIQUIS) 5 MG TABS tablet Take 1 tablet (5 mg total) by mouth 2 (two) times daily. 180 tablet 3   dofetilide (TIKOSYN) 250 MCG capsule Take 1 capsule (250 mcg total) by mouth 2 (two) times daily. 180 capsule 1   furosemide (LASIX) 20 MG tablet Take 1 tablet (20 mg total) by mouth daily. 90 tablet 1   loratadine (CLARITIN) 10 MG tablet Take 10 mg by mouth at bedtime.      metoprolol succinate (TOPROL-XL) 50 MG 24 hr tablet Take 1 tablet (50 mg total) by mouth 2 (two) times daily. 90 tablet 3   metoprolol tartrate (LOPRESSOR) 50 MG tablet Take 1 tablet (50 mg total) by mouth daily as needed (consistent heart greater than 120 (longer than an hour at rest)). 60 tablet 6   Multiple Vitamins-Minerals (MULTIVITAMIN WITH MINERALS) tablet Take 1 tablet by mouth daily.     pantoprazole (PROTONIX) 40 MG tablet Take 1 tablet (40 mg total) by mouth daily. 90 tablet 3   potassium chloride (KLOR-CON) 10 MEQ tablet Take 1 tablet (10 mEq total) by mouth daily. 90 tablet 3   No current facility-administered medications for this encounter.    Allergies  Allergen Reactions    Tape     Other reaction(s): Other (See Comments) Causes skin to tear    Social History   Socioeconomic History   Marital status: Divorced    Spouse name: Not on file   Number of children: 2   Years of education: Not on file   Highest education level: Not on file  Occupational History    Employer: [employer]  Tobacco Use   Smoking status: Never   Smokeless tobacco: Never   Tobacco comments:    Never smoke 09/21/21  Vaping Use   Vaping Use: Never used  Substance and Sexual Activity   Alcohol use: Not Currently   Drug use: No   Sexual activity: Not Currently  Other Topics Concern   Not on file  Social History Narrative   Not on file  Social Determinants of Health   Financial Resource Strain: Not on file  Food Insecurity: Not on file  Transportation Needs: Not on file  Physical Activity: Not on file  Stress: Not on file  Social Connections: Not on file  Intimate Partner Violence: Not on file     ROS- All systems are reviewed and negative except as per the HPI above.  Physical Exam: Vitals:   02/25/22 1104  BP: 126/76  Pulse: 83  Weight: (!) 147.9 kg  Height: 5' 4.5" (1.638 m)     GEN- The patient is a well appearing obese female, alert and oriented x 3 today.   HEENT-head normocephalic, atraumatic, sclera clear, conjunctiva pink, hearing intact, trachea midline. Lungs- Clear to ausculation bilaterally, normal work of breathing Heart- Regular rate and rhythm, no murmurs, rubs or gallops  GI- soft, NT, ND, + BS Extremities- no clubbing, cyanosis, or edema MS- no significant deformity or atrophy Skin- no rash or lesion Psych- euthymic mood, full affect Neuro- strength and sensation are intact   Wt Readings from Last 3 Encounters:  02/25/22 (!) 147.9 kg  11/22/21 (!) 144.7 kg  11/04/21 (!) 149.6 kg    EKG today demonstrates  SR Vent. rate 83 BPM PR interval 146 ms QRS duration 78 ms QT/QTcB  418/491 ms  Echo 11/15/21 demonstrated   1. Left ventricular ejection fraction, by estimation, is 60 to 65%. The  left ventricle has normal function. Left ventricular endocardial border  not optimally defined to evaluate regional wall motion. Left ventricular  diastolic parameters were normal.   2. Right ventricular systolic function is normal. The right ventricular  size is normal. Tricuspid regurgitation signal is inadequate for assessing  PA pressure.   3. A small pericardial effusion is present. The pericardial effusion is  circumferential.   4. The mitral valve is grossly normal. Trivial mitral valve  regurgitation. No evidence of mitral stenosis.   5. The aortic valve was not well visualized. Aortic valve regurgitation  is not visualized. No aortic stenosis is present.   Epic records are reviewed at length today  CHA2DS2-VASc Score = 2  The patient's score is based upon: CHF History: 1 HTN History: 0 Diabetes History: 0 Stroke History: 0 Vascular Disease History: 0 Age Score: 0 Gender Score: 1      ASSESSMENT AND PLAN: 1. Persistent Atrial Fibrillation/atrial flutter/atrial tachycardia The patient's CHA2DS2-VASc score is 2, indicating a 2.2% annual risk of stroke.   S/p dofetilide admission 6/6-09/24/21 We discussed rhythm control options today. Would not favor amiodarone given her young age. Not likely an ablation candidate at this time with elevated BMI. Will continue dofetilide and focus on aggressive lifestyle modification.  Continue dofetilide 250 mcg BID. QT stable. Check bmet/mag today.  Increase Toprol to 75 mg BID Continue Eliquis 5 mg BID Continue Lopressor 50 mg q 6 hours for heart rate >100.   2. Obesity Body mass index is 55.09 kg/m. Lifestyle modification was discussed and encouraged including regular physical activity and weight reduction. [facility]. Patient would like to try exercising on her own first.    3. Lower extremity edema Patient reporting intermittent edema, taking Lasix daily Check bmet/BNP today.     Follow up in the AF clinic in 3 months.    [facility] [address] [place], [place] [address] [phone] 02/25/2022 11:12 AM

## 2022-02-25 NOTE — Patient Instructions (Signed)
Increase Metoprolol Succinate- '75mg'$   (Take one tablet twice daily)

## 2022-03-07 ENCOUNTER — Other Ambulatory Visit (HOSPITAL_COMMUNITY): Payer: Self-pay | Admitting: *Deleted

## 2022-03-07 MED ORDER — METOPROLOL SUCCINATE ER 50 MG PO TB24: 75.0000 mg | ORAL_TABLET | Freq: Two times a day (BID) | ORAL | 2 refills | Status: DC

## 2022-05-27 ENCOUNTER — Encounter (HOSPITAL_COMMUNITY): Payer: Self-pay

## 2022-05-27 ENCOUNTER — Ambulatory Visit (HOSPITAL_COMMUNITY): Payer: Medicaid Other | Admitting: Physician Assistant

## 2022-06-10 ENCOUNTER — Ambulatory Visit (HOSPITAL_COMMUNITY): Payer: Medicaid Other | Admitting: Physician Assistant

## 2022-06-10 NOTE — Progress Notes (Incomplete)
Primary Care Physician: Raina Mina., MD Primary Cardiologist: Dr Aundra Dubin (remotely) Primary Electrophysiologist: Dr Curt Bears Referring Physician: Dr Alcide Clever is a 57 y.o. female with a history of paroxysmal atrial fibrillation, atrial flutter, atrial tachycardia, HLD who presents for follow up in the [facility].  The patient was initially diagnosed with atrial arrhythmias in 2014. She has been maintained on flecainide and metoprolol. Patient is not on anticoagulation for a CHADS2VASC score of 1. She was in her usual state of health until 08/15/19 when she presented to the ED with generalized fatigue for the last two weeks and was found to be in rapid atrial flutter. She was rate controlled and discharged. Patient states she was being treated for an URI with prednisone and abx prior to the onset of symptoms. Patient is s/p TEE/DCCV on 08/20/19. Unfortunately, she had reoccurrence of her atrial flutter on 08/26/19 and was cardioverted in the ER again. Patient is s/p DCCV on 03/31/20 and her flecainide was increased to 150 mg BID. Unfortunately, she had QRS widening on this dose and it had to be discontinued. She had two visits to the (ED 08/22/21 and 08/30/21) after stopping the medication with both atrial flutter and SVT.  Patient is s/p dofetilide admission 6/6-09/24/21. Patient converted to SR with medication and did not require DCCV.   On follow up today, ***  Today, she denies symptoms of ***chest pain, PND, presyncope, syncope, bleeding. The patient is tolerating medications without difficulties and is otherwise without complaint today.    Atrial Fibrillation Risk Factors:  she does not have symptoms or diagnosis of sleep apnea. Negative sleep study 09/29/19. she does not have a history of rheumatic fever. she does not have a history of alcohol use. The patient does not have a history of early familial atrial fibrillation or other arrhythmias.  she  has a BMI of There is no height or weight on file to calculate BMI.. There were no vitals filed for this visit.   Family History  Problem Relation Age of Onset   Colon cancer Mother    Rectal cancer Mother    Heart disease Other        several family members on dad's side have had heart issues - earliest in their 81's   Stroke Paternal Grandfather    Diabetes Paternal Grandfather    Breast cancer Sister    Colon polyps Sister        x 2   Stroke Paternal Grandmother    Diabetes Paternal Grandmother      Atrial Fibrillation Management history:  Previous antiarrhythmic drugs: flecainide, dofetilide  Previous cardioversions: 08/20/19, 08/26/19, 03/31/20 Previous ablations: none CHADS2VASC score: 2 Anticoagulation history: Eliquis   Past Medical History:  Diagnosis Date   Atrial tachycardia    a. Dx 03/2013.   Chest pain    a. 03/2013: cath negative for CAD, no PE by CT angio.   Dysrhythmia    Hyperglycemia    a. A1c 5.8 in 03/2013.   Hyperlipidemia    a. Monitored by PCP - elected to pursue lifestyle modification in 09/2012 with reassessment pending.   PAC (premature atrial contraction)    a. Dx 03/2013.   PAF (paroxysmal atrial fibrillation) ([place])    a. Dx 03/2013 - started on flecainide. CHADSVASC = 1 so no anticoag at present time.   Paroxysmal atrial flutter ([place])    a. Dx 03/2013.   Pulmonary nodule, right    a.  46m RML by CT angio 03/2013.   [place]   Past Surgical History:  Procedure Laterality Date   APPENDECTOMY     CARDIOVERSION N/A 08/20/2019   Procedure: CARDIOVERSION;  Surgeon: CLelon Perla MD;  [facility];  Service: Cardiovascular;  Laterality: N/A;   CARDIOVERSION N/A 03/31/2020   Procedure: CARDIOVERSION;  Surgeon: CBuford Dresser MD;  [facility];  Service: Cardiovascular;  Laterality: N/A;   CHOLECYSTECTOMY     HERNIA REPAIR     LEFT HEART CATHETERIZATION WITH CORONARY ANGIOGRAM N/A  03/28/2013   Procedure: LEFT HEART CATHETERIZATION WITH CORONARY ANGIOGRAM;  Surgeon: JRamond Dial MD;  [facility];  Service: Cardiovascular;  Laterality: N/A;   RADICAL HYSTERECTOMY  06/10/2021   robotic   TEE WITHOUT CARDIOVERSION N/A 08/20/2019   Procedure: TRANSESOPHAGEAL ECHOCARDIOGRAM (TEE);  Surgeon: CLelon Perla MD;  [facility];  Service: Cardiovascular;  Laterality: N/A;    Current Outpatient Medications  Medication Sig Dispense Refill   apixaban (ELIQUIS) 5 MG TABS tablet Take 1 tablet (5 mg total) by mouth 2 (two) times daily. 180 tablet 3   dofetilide (TIKOSYN) 250 MCG capsule Take 1 capsule (250 mcg total) by mouth 2 (two) times daily. 180 capsule 1   furosemide (LASIX) 20 MG tablet Take 1 tablet (20 mg total) by mouth daily. 90 tablet 1   loratadine (CLARITIN) 10 MG tablet Take 10 mg by mouth at bedtime.      metoprolol succinate (TOPROL-XL) 50 MG 24 hr tablet Take 1.5 tablets (75 mg total) by mouth 2 (two) times daily. 270 tablet 2   metoprolol tartrate (LOPRESSOR) 50 MG tablet Take 1 tablet (50 mg total) by mouth daily as needed (consistent heart greater than 120 (longer than an hour at rest)). 45 tablet 1   Multiple Vitamins-Minerals (MULTIVITAMIN WITH MINERALS) tablet Take 1 tablet by mouth daily.     pantoprazole (PROTONIX) 40 MG tablet Take 1 tablet (40 mg total) by mouth daily. 90 tablet 3   potassium chloride (KLOR-CON) 10 MEQ tablet Take 1 tablet (10 mEq total) by mouth daily. 90 tablet 3   No current facility-administered medications for this visit.    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  Tobacco Use   Smoking status: Never   Smokeless tobacco: Never   Tobacco comments:    Never smoke 09/21/21  Vaping Use   Vaping Use: Never used  Substance and Sexual Activity   Alcohol use: Not Currently   Drug use: No   Sexual activity: Not Currently  Other Topics Concern   Not on file  Social History Narrative   Not on file   Social Determinants of Health   Financial Resource Strain: Not on file  Food Insecurity: Not on file  Transportation Needs: Not on file  Physical Activity: Not on file  Stress: Not on file  Social Connections: Not on file  Intimate Partner Violence: Not on file     ROS- All systems are reviewed and negative except as per the HPI above.  Physical Exam: There were no vitals filed for this visit.  GEN- The patient is a well appearing *** {Desc; female/female:11659}, alert and oriented x 3 today.   HEENT-head normocephalic, atraumatic, sclera clear, conjunctiva pink, hearing intact, trachea midline. Lungs- Clear to ausculation bilaterally, normal work of breathing Heart- ***Regular rate and rhythm, no murmurs, rubs or gallops  GI- soft, NT, ND, + BS Extremities- no clubbing, cyanosis, or edema MS- no significant deformity or atrophy Skin- no rash or lesion Psych- euthymic mood, full affect Neuro- strength and sensation are intact   Wt Readings from Last 3 Encounters:  02/25/22 (!) 147.9 kg  11/22/21 (!) 144.7 kg  11/04/21 (!) 149.6 kg    EKG today demonstrates  ***  Echo 11/15/21 demonstrated   1. Left ventricular ejection fraction, by estimation, is 60 to 65%. The  left ventricle has normal function. Left ventricular endocardial border  not optimally defined to evaluate regional wall motion. Left ventricular  diastolic parameters were normal.   2. Right ventricular systolic function is normal. The right ventricular  size is normal. Tricuspid regurgitation signal is inadequate for assessing  PA pressure.   3. A  small pericardial effusion is present. The pericardial effusion is  circumferential.   4. The mitral valve is grossly normal. Trivial mitral valve  regurgitation. No evidence of mitral stenosis.   5. The aortic valve was not well visualized. Aortic valve regurgitation  is not visualized. No aortic stenosis is present.   Epic records are reviewed at length today  CHA2DS2-VASc Score = 2  The patient's score is based upon: CHF History: 1 HTN History: 0 Diabetes History: 0 Stroke History: 0 Vascular Disease History: 0 Age Score: 0 Gender Score: 1      ASSESSMENT AND PLAN: 1. Persistent Atrial Fibrillation/atrial flutter/atrial tachycardia The patient's CHA2DS2-VASc score is 2, indicating a 2.2% annual risk of stroke.   S/p dofetilide admission 6/6-09/24/21 *** We discussed rhythm control options today. Would not favor amiodarone given her young age. Not likely an ablation candidate at this time with elevated BMI. Will continue dofetilide and focus on aggressive lifestyle modification.  Continue dofetilide 250 mcg BID. QT stable.  Check bmet/mag today.  Continue Toprol 75 mg BID Continue Eliquis 5 mg BID Continue Lopressor 50 mg q 6 hours for heart rate >100.   2. Obesity There is no height or weight on file to calculate BMI. Lifestyle modification was discussed and encouraged including regular physical activity and weight reduction. *** [facility]. Patient would like to try exercising on her own first.   3. Lower extremity edema ***Patient reporting intermittent edema, taking Lasix daily    Follow up ***   Adline Peals PA-C [facility] [place], [place] [address] [phone] 06/10/2022 8:19 AM

## 2022-06-22 NOTE — Progress Notes (Deleted)
  Cardiology Office Note:   Date:  06/22/2022  ID:  TAMIIA LEBECK, DOB 27-Mar-1966, MRN [id number]  Primary Cardiologist: Will Meredith Leeds, MD Electrophysiologist: Constance Haw, MD   History of Present Illness:   Sandra Stark is a 57 y.o. female with h/o AFL, AF, atrial tachycardia, and HLD seen today for routine electrophysiology followup. Since last being seen in our clinic the patient reports doing ***.  she denies chest pain, palpitations, dyspnea, PND, orthopnea, nausea, vomiting, dizziness, syncope, edema, weight gain, or early satiety.   Review of systems complete and found to be negative unless listed in HPI.   AAD History Flecainide stopped previously with QRS widening  {Click here to Review PMH, Prob List, Meds, Allergies, SHx, FHx  :1}   Studies Reviewed:    EKG is ordered today. Personal review shows ***  Echo 10/2021 LVEF 60-65%. Trivial MR  Risk Assessment/Calculations:    No BP recorded.  {Refresh Note OR Click here to enter BP  :1}***        Physical Exam:   VS:  LMP 11/02/2019    Wt Readings from Last 3 Encounters:  02/25/22 (!) 326 lb (147.9 kg)  11/22/21 (!) 319 lb (144.7 kg)  11/04/21 (!) 329 lb 12.8 oz (149.6 kg)     GEN: Well nourished, well developed in no acute distress NECK: No JVD; No carotid bruits CARDIAC: {EPRHYTHM:[id number]}, no murmurs, rubs, gallops RESPIRATORY:  Clear to auscultation without rales, wheezing or rhonchi  ABDOMEN: Soft, non-tender, non-distended EXTREMITIES:  No edema; No deformity   ASSESSMENT AND PLAN:   Paroxysmal atrial fibrillation Paroxysmal atrial flutter Atrial tachycardia EKG today shows *** Continue dofetilide 250 mcg BID BMET/Mg today Continue Eliquis 5 mg BID for CHA2DS2VASc of at least 2 Continue Lopressor prn for HR >100 Continue Toprol 75 mg BID  Obesity There is no height or weight on file to calculate BMI.  Encouraged lifestyle modification     {Are you ordering a CV Procedure (e.g. stress  test, cath, DCCV, TEE, etc)?   Press F2        :[id number]   Follow up with [id number] {EPFOLLOW [id number]  Signed, Shirley Friar, PA-C

## 2022-06-23 ENCOUNTER — Ambulatory Visit: Payer: Medicaid Other | Admitting: Student

## 2022-06-23 DIAGNOSIS — I4811 Longstanding persistent atrial fibrillation: Secondary | ICD-10-CM

## 2022-06-23 DIAGNOSIS — I480 Paroxysmal atrial fibrillation: Secondary | ICD-10-CM

## 2022-06-23 DIAGNOSIS — I4719 Other supraventricular tachycardia: Secondary | ICD-10-CM

## 2022-06-23 DIAGNOSIS — I4819 Other persistent atrial fibrillation: Secondary | ICD-10-CM

## 2022-06-28 NOTE — Progress Notes (Signed)
  Cardiology Office Note:   Date:  07/06/2022  ID:  Sandra Stark, DOB [birth year], MRN [id number]  Primary Cardiologist: Will Meredith Leeds, MD Electrophysiologist: Constance Haw, MD   History of Present Illness:   Sandra Stark is a 57 y.o. female with h/o PAF, AFL, AT, and HLDseen today for routine electrophysiology followup. Since last being seen in our clinic the patient reports doing well. She did have a breakthrough episode 2 weeks ago, where her heart was "stuck" around 168 for ~ 3-4 hrs. She took two additional lopressor.  She felt fatigued during the episode, but no other symptoms.  Prior to this had a more brief episode 2/29. Avoids caffeine and alcohol.  she denies chest pain, dyspnea, PND, orthopnea, nausea, vomiting, dizziness, syncope, edema, weight gain, or early satiety.   Seen at Urgent Care Monday for possible UTI, was found to have Glucose > 300. HgbA1c pending.  Review of systems complete and found to be negative unless listed in HPI.   AAD history Previously failed flecainide, QRS widening at higher doses, ineffective at lower doses Started Tikosyn 09/2021  Studies Reviewed:    EKG is ordered today. Personal review shows NSR at 80 bpm with stable QT when measured manually and compared to prior.   Risk Assessment/Calculations:    CHA2DS2-VASc Score = 2              Physical Exam:   VS:  BP 122/80   Pulse 80   Ht 5' 4.5" (1.638 m)   Wt (!) 315 lb (142.9 kg)   LMP 11/02/2019   BMI 53.23 kg/m    Wt Readings from Last 3 Encounters:  07/06/22 (!) 315 lb (142.9 kg)  02/25/22 (!) 326 lb (147.9 kg)  11/22/21 (!) 319 lb (144.7 kg)     GEN: Well nourished, well developed in no acute distress NECK: No JVD; No carotid bruits CARDIAC: Regular rate and rhythm, no murmurs, rubs, gallops RESPIRATORY:  Clear to auscultation without rales, wheezing or rhonchi  ABDOMEN: Soft, non-tender, non-distended EXTREMITIES:  No edema; No deformity   ASSESSMENT AND  PLAN:   Persistent Atrial Fibrillation Persistent Atrial Flutter Atrial tachycardia EKG today shows NSR at 80 bpm Continue Tikosyn 250 mcg BID Increase Toprol to 100 mg BID Continue Eliquis 5 mg BID Continue lopressor prn.  Labs today.  Breakthrough with HR stuck at 168 may have been 2:1 flutter.   Obesity Body mass index is 53.23 kg/m.  Encouraged lifestyle modification    Follow up with Dr. Curt Bears in 6 months, sooner if breakthroughs worsening.   Signed, Shirley Friar, PA-C

## 2022-07-04 ENCOUNTER — Ambulatory Visit: Payer: Medicaid Other | Admitting: Student

## 2022-07-06 ENCOUNTER — Encounter: Payer: Self-pay | Admitting: Student

## 2022-07-06 ENCOUNTER — Ambulatory Visit: Payer: Medicaid Other | Attending: Student | Admitting: Student

## 2022-07-06 VITALS — BP 122/80 | HR 80 | Ht 64.5 in | Wt 315.0 lb

## 2022-07-06 DIAGNOSIS — I4719 Other supraventricular tachycardia: Secondary | ICD-10-CM | POA: Diagnosis not present

## 2022-07-06 DIAGNOSIS — I4819 Other persistent atrial fibrillation: Secondary | ICD-10-CM

## 2022-07-06 DIAGNOSIS — I4892 Unspecified atrial flutter: Secondary | ICD-10-CM | POA: Diagnosis not present

## 2022-07-06 MED ORDER — METOPROLOL SUCCINATE ER 100 MG PO TB24: 100.0000 mg | ORAL_TABLET | Freq: Two times a day (BID) | ORAL | 3 refills | Status: DC

## 2022-07-06 NOTE — Patient Instructions (Addendum)
Medication Instructions:  1.Increase metoprolol succinate (Toprol XL) to 100 mg twice daily *If you need a refill on your cardiac medications before your next appointment, please call your pharmacy*  Lab Work: BMET, Mag today If you have labs (blood work) drawn today and your tests are completely normal, you will receive your results only by: [place] (if you have MyChart) OR A paper copy in the mail If you have any lab test that is abnormal or we need to change your treatment, we will call you to review the results.  Follow-Up: At [facility], you and your health needs are our priority.  As part of our continuing mission to provide you with exceptional heart care, we have created designated Provider Care Teams.  These Care Teams include your primary Cardiologist (physician) and Advanced Practice Providers (APPs -  Physician Assistants and Nurse Practitioners) who all work together to provide you with the care you need, when you need it.  Your next appointment:   6 month(s)  Provider:   Allegra Lai, MD

## 2022-07-07 ENCOUNTER — Other Ambulatory Visit: Payer: Self-pay | Admitting: *Deleted

## 2022-07-07 LAB — BASIC METABOLIC PANEL
BUN/Creatinine Ratio: 13 (ref 9–23)
BUN: 13 mg/dL (ref 6–24)
CO2: 22 mmol/L (ref 20–29)
Calcium: 8.9 mg/dL (ref 8.7–10.2)
Chloride: 97 mmol/L (ref 96–106)
Creatinine, Ser: 0.98 mg/dL (ref 0.57–1.00)
Glucose: 365 mg/dL — ABNORMAL HIGH (ref 70–99)
Potassium: 4.6 mmol/L (ref 3.5–5.2)
Sodium: 137 mmol/L (ref 134–144)
eGFR: 67 mL/min/{1.73_m2} (ref >59)

## 2022-07-07 LAB — MAGNESIUM: Magnesium: 1.8 mg/dL (ref 1.6–2.3)

## 2022-07-07 MED ORDER — MAGNESIUM OXIDE 400 MG PO CAPS: 400.0000 mg | ORAL_CAPSULE | Freq: Every day | ORAL | 0 refills | Status: AC

## 2022-07-07 NOTE — Addendum Note (Signed)
Addended by: Juventino Slovak on: 07/07/2022 01:09 PM   Modules accepted: Orders

## 2022-07-17 IMAGING — MG MM DIGITAL SCREENING BILAT W/ TOMO AND CAD
8 series · 8 of 24 positions shown · non-contrast
Comparison: Previous exam(s).

CLINICAL DATA: Screening.

EXAM:
DIGITAL SCREENING BILATERAL MAMMOGRAM WITH TOMOSYNTHESIS AND CAD
TECHNIQUE: Bilateral screening digital craniocaudal and mediolateral oblique
mammograms were obtained. Bilateral screening digital breast
tomosynthesis was performed. The images were evaluated with
computer-aided detection.

[L MLO synth-2D]
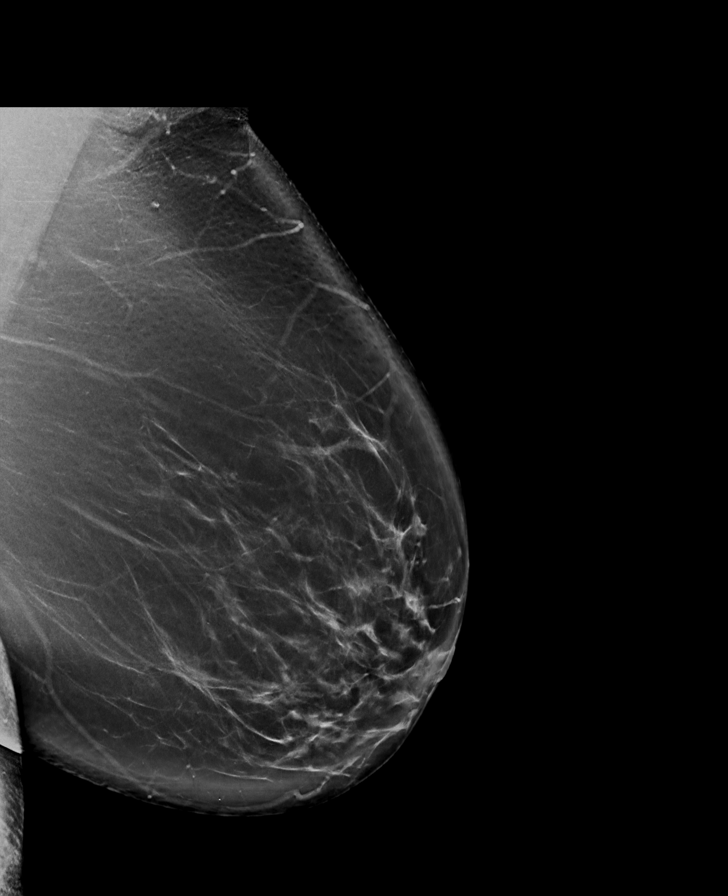

[R MLO synth-2D]
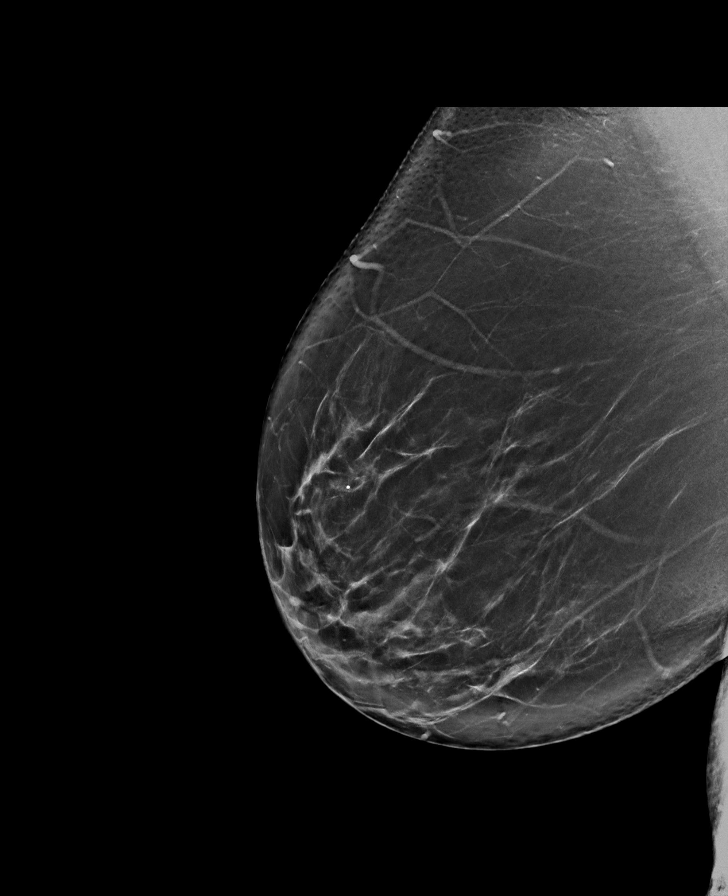

[L CC synth-2D]
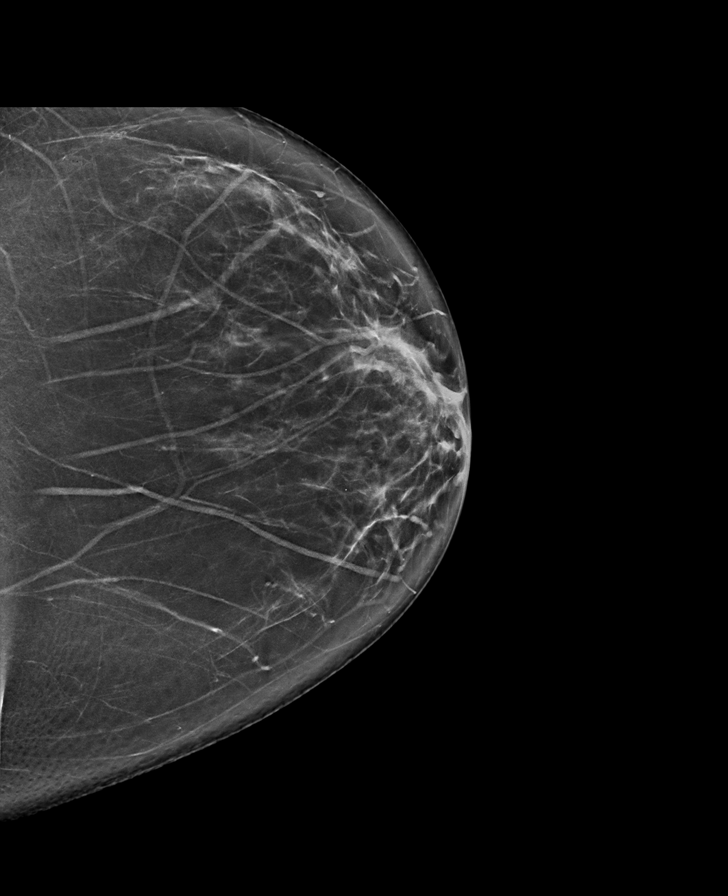

[R CC synth-2D]
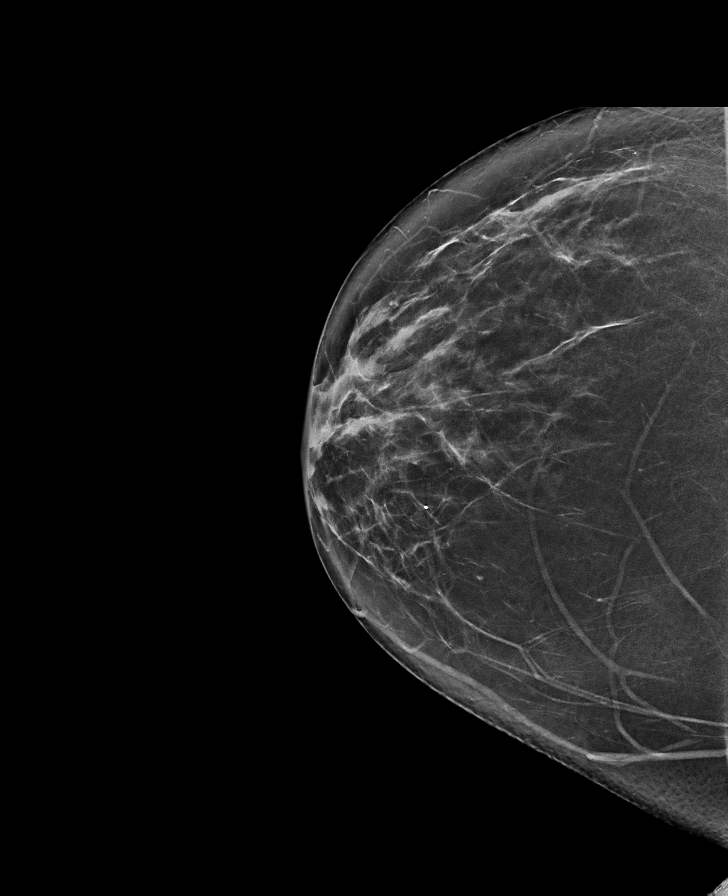

[R MLO tomo · tomo slice 49/97.0]
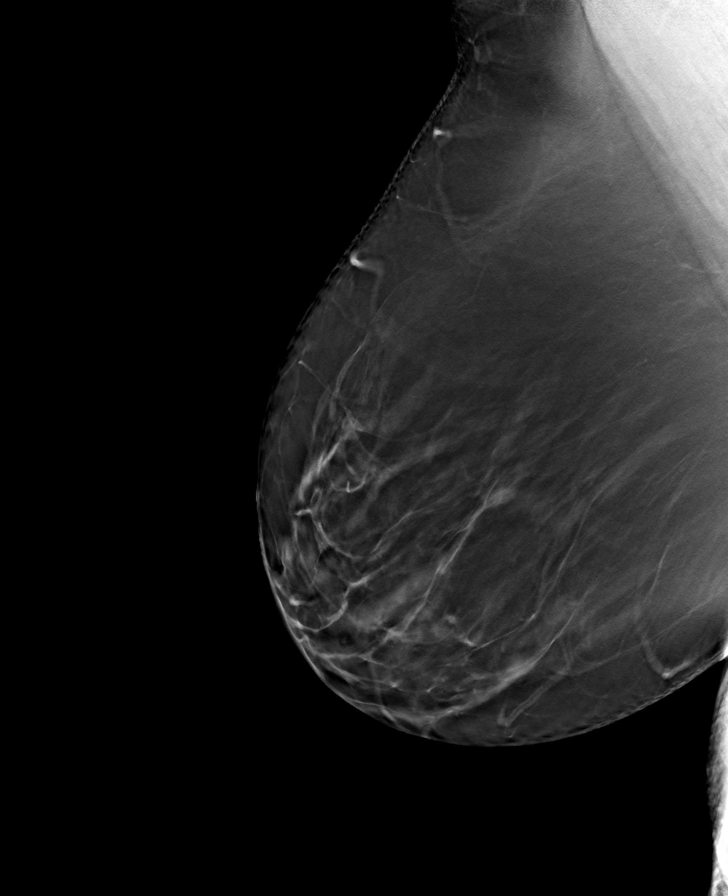

[L CC tomo · tomo slice 42/83.0]
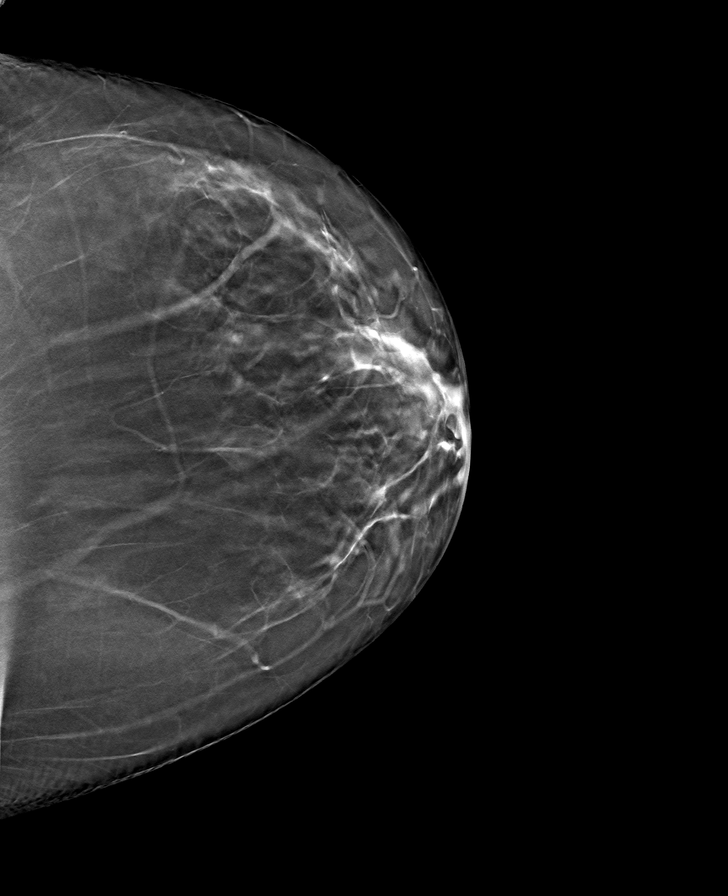

[L MLO tomo · tomo slice 53/104.0]
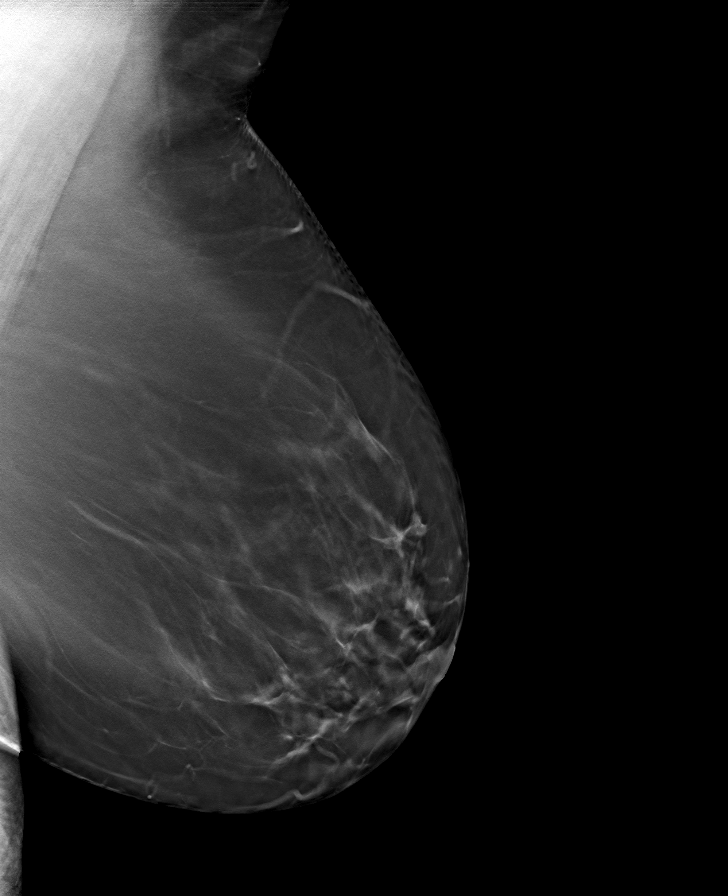

[R CC tomo · tomo slice 41/80.0]
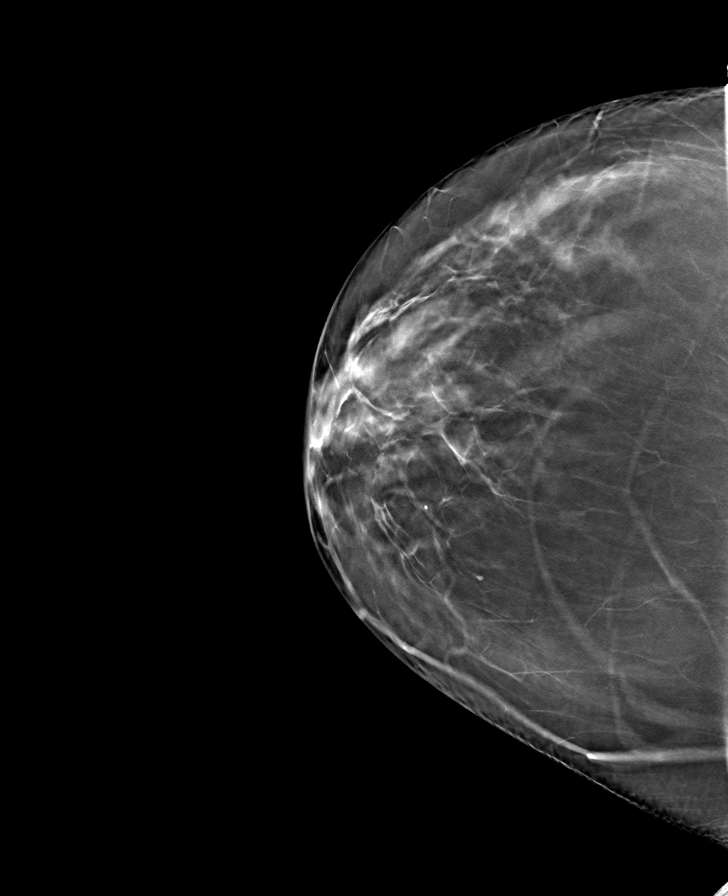

[8 of 24 positions shown; findings below may reference images not displayed]

ACR Breast Density Category b: There are scattered areas of
fibroglandular density.
FINDINGS: There are no findings suspicious for malignancy.
IMPRESSION: No mammographic evidence of malignancy. A result letter of this
screening mammogram will be mailed directly to the patient.

RECOMMENDATION:
Screening mammogram in one year. (Code:51-O-LD2)

BI-RADS CATEGORY  1: Negative.

## 2022-07-21 ENCOUNTER — Other Ambulatory Visit: Payer: Self-pay | Admitting: Physician Assistant

## 2022-08-17 ENCOUNTER — Other Ambulatory Visit (HOSPITAL_COMMUNITY): Payer: Self-pay | Admitting: Physician Assistant

## 2022-10-12 ENCOUNTER — Other Ambulatory Visit (HOSPITAL_COMMUNITY): Payer: Self-pay | Admitting: *Deleted

## 2022-10-29 IMAGING — DX DG CHEST 1V PORT
1 series · 1 of 1 positions shown · non-contrast
Comparison: CTA chest 08/02/2021 and earlier.

CLINICAL DATA: 56-year-old female with chest pain.

EXAM:
PORTABLE CHEST 1 VIEW

[chest ap]
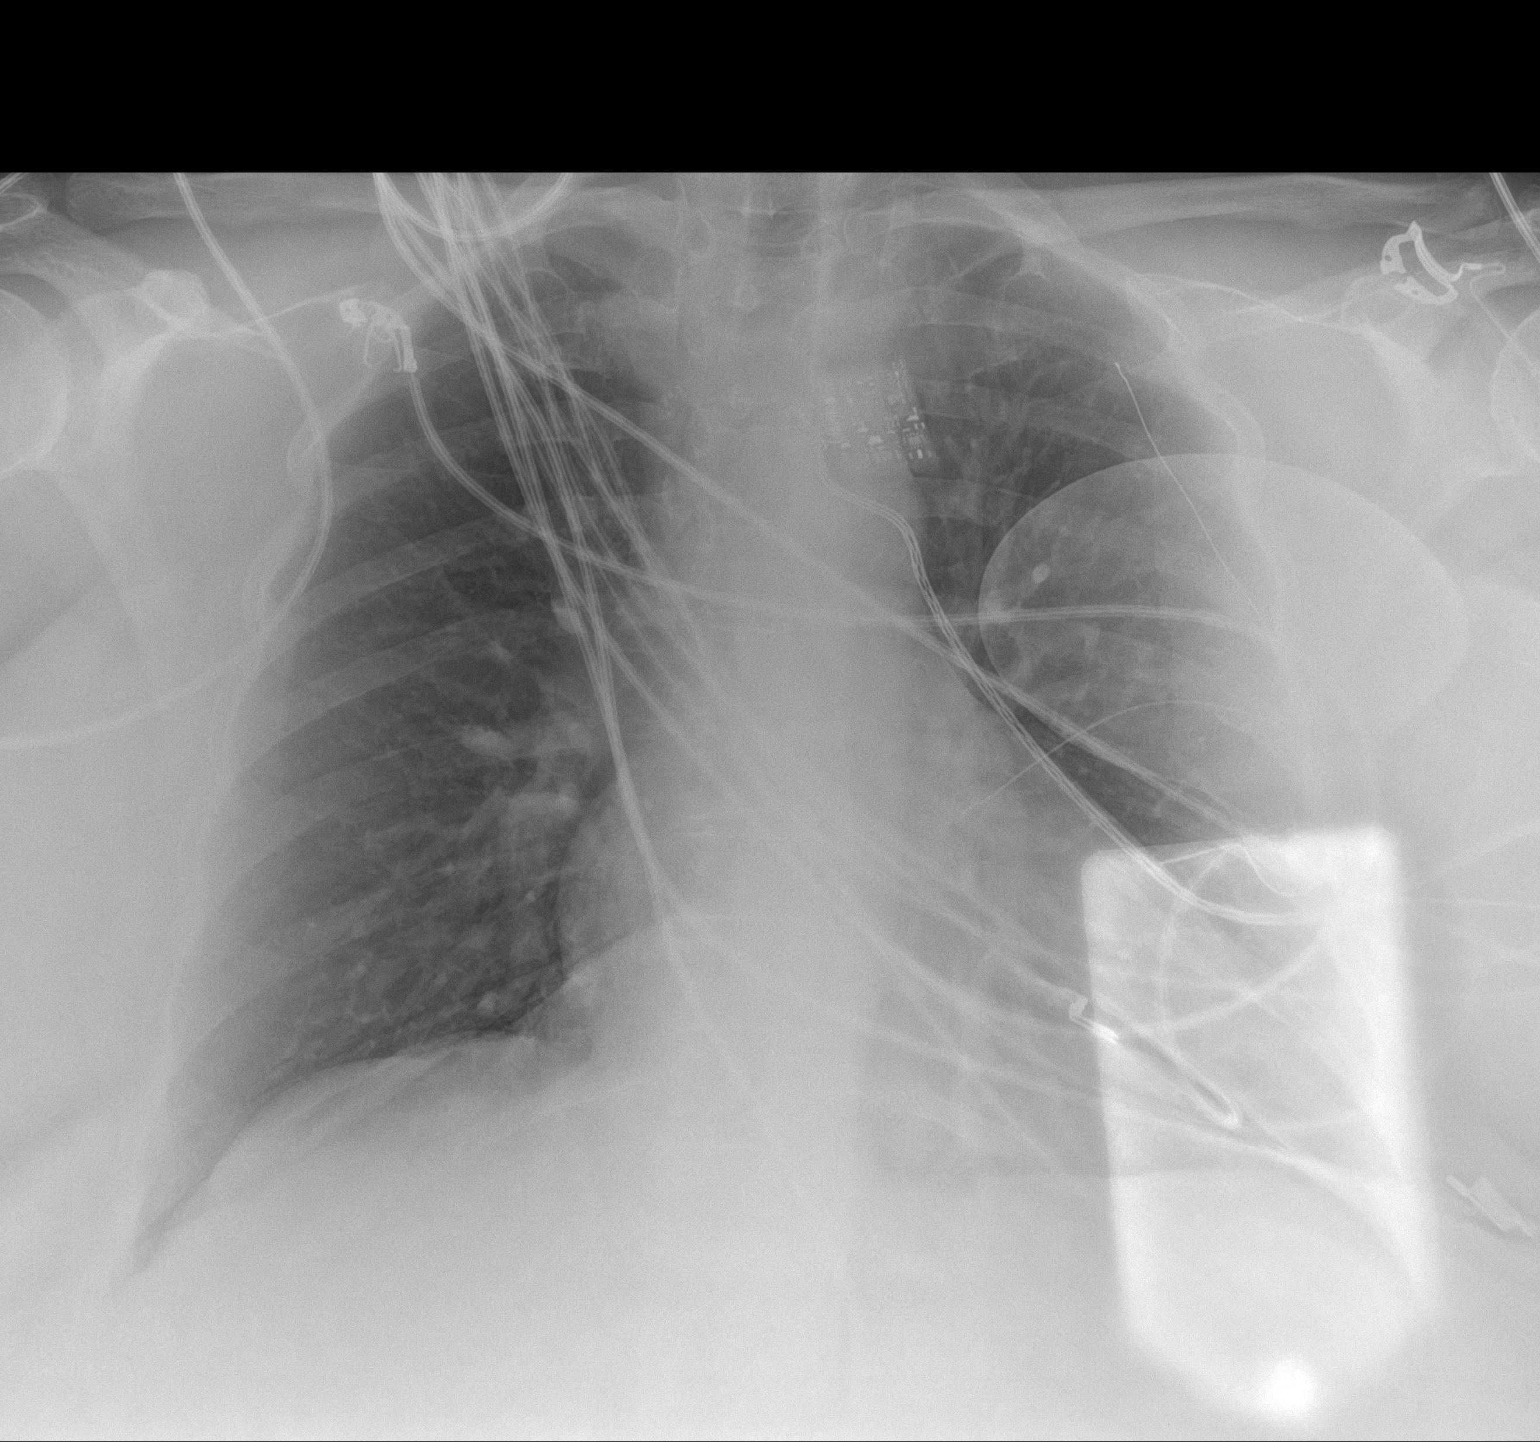

[1 of 1 positions shown; findings below may reference images not displayed]

FINDINGS: Portable AP upright view at 2068 hours. Stable lung volumes. Normal
cardiac size and mediastinal contours. Pacing or resuscitation pads
project over the left chest. Allowing for portable technique the
lungs are clear. No pneumothorax. No acute osseous abnormality
identified. Paucity of bowel gas in the upper abdomen.
IMPRESSION: No acute cardiopulmonary abnormality.

## 2022-12-26 ENCOUNTER — Ambulatory Visit: Payer: Self-pay | Attending: Cardiology | Admitting: Cardiology

## 2022-12-26 ENCOUNTER — Encounter: Payer: Self-pay | Admitting: Cardiology

## 2022-12-26 VITALS — BP 124/82 | HR 66 | Ht 64.5 in | Wt 299.6 lb

## 2022-12-26 DIAGNOSIS — I4819 Other persistent atrial fibrillation: Secondary | ICD-10-CM

## 2022-12-26 DIAGNOSIS — Z79899 Other long term (current) drug therapy: Secondary | ICD-10-CM

## 2022-12-26 NOTE — Patient Instructions (Signed)
Medication Instructions:  Your physician recommends that you continue on your current medications as directed. Please refer to the Current Medication list given to you today.  *If you need a refill on your cardiac medications before your next appointment, please call your pharmacy*   Lab Work: Your physician recommends that you return for lab work in: 1-2 weeks for BMET & Magnesium  If you have labs (blood work) drawn today and your tests are completely normal, you will receive your results only by: MyChart Message (if you have MyChart) OR A paper copy in the mail If you have any lab test that is abnormal or we need to change your treatment, we will call you to review the results.   Testing/Procedures: None ordered   Follow-Up: At [facility], you and your health needs are our priority.  As part of our continuing mission to provide you with exceptional heart care, we have created designated Provider Care Teams.  These Care Teams include your primary Cardiologist (physician) and Advanced Practice Providers (APPs -  Physician Assistants and Nurse Practitioners) who all work together to provide you with the care you need, when you need it.  Your next appointment:   6 month(s)  The format for your next appointment:   In Person  Provider:   Wallis Bamberg, NP Rosalita Levan)    Thank you for choosing [facility]!!   Dory Horn, RN [phone]

## 2022-12-26 NOTE — Progress Notes (Signed)
  Electrophysiology Office Note:   Date:  12/26/2022  ID:  Sandra Stark, DOB 03-30-1966, MRN [id number]  Primary Cardiologist: Arnisha Laffoon Jorja Loa, MD Electrophysiologist: Regan Lemming, MD      History of Present Illness:   Sandra Stark is a 57 y.o. female with h/o atrial fibrillation seen today for routine electrophysiology followup.   Since last being seen in our clinic the patient reports doing well.  She has noted no further episodes of atrial fibrillation other than 1 episode 4 months ago.  She is happy with her control.  She is having difficulty affording her Eliquis.  Aside from that, she has been losing weight, after she has adjusted her diet and been exercising.  she denies chest pain, palpitations, dyspnea, PND, orthopnea, nausea, vomiting, dizziness, syncope, edema, weight gain, or early satiety.   Review of systems complete and found to be negative unless listed in HPI.   EP Information / Studies Reviewed:    EKG is ordered today. Personal review as below.  EKG Interpretation Date/Time:  Monday December 26 2022 12:09:38 EDT Ventricular Rate:  66 PR Interval:  152 QRS Duration:  78 QT Interval:  454 QTC Calculation: 475 R Axis:   22  Text Interpretation: Normal sinus rhythm Low voltage QRS When compared with ECG of 25-Feb-2022 11:08, No significant change was found Confirmed by Nicholous Girgenti ([id number]) on 12/26/2022 12:20:35 PM     Risk Assessment/Calculations:    CHA2DS2-VASc Score = 2   This indicates a 2.2% annual risk of stroke. The patient's score is based upon: CHF History: 0 HTN History: 0 Diabetes History: 1 Stroke History: 0 Vascular Disease History: 0 Age Score: 0 Gender Score: 1              Physical Exam:   VS:  BP 124/82   Pulse 66   Ht 5' 4.5" (1.638 m)   Wt 299 lb 9.6 oz (135.9 kg)   LMP 11/02/2019   SpO2 96%   BMI 50.63 kg/m    Wt Readings from Last 3 Encounters:  12/26/22 299 lb 9.6 oz (135.9 kg)  07/06/22 (!) 315 lb (142.9  kg)  02/25/22 (!) 326 lb (147.9 kg)     GEN: Well nourished, well developed in no acute distress NECK: No JVD; No carotid bruits CARDIAC: Regular rate and rhythm, no murmurs, rubs, gallops RESPIRATORY:  Clear to auscultation without rales, wheezing or rhonchi  ABDOMEN: Soft, non-tender, non-distended EXTREMITIES:  No edema; No deformity   ASSESSMENT AND PLAN:    1.  Persistent atrial fibrillation/flutter/tachycardia: Currently on Tikosyn and metoprolol.  Remains in sinus rhythm.  Minimal episodes of arrhythmia.    2.  Obesity: Lifestyle modification encouraged  3.  High risk medication monitoring: Chimere Klingensmith check BMP and magnesium today.  This Shawntina Diffee need to be checked every 6 months.  QTc is remained stable on dofetilide.  Follow up with  in 6 months  Signed, Daviel Allegretto Jorja Loa, MD

## 2023-04-22 ENCOUNTER — Other Ambulatory Visit: Payer: Self-pay | Admitting: Student

## 2023-07-03 ENCOUNTER — Other Ambulatory Visit: Payer: Self-pay | Admitting: Student

## 2023-07-03 ENCOUNTER — Other Ambulatory Visit (HOSPITAL_COMMUNITY): Payer: Self-pay | Admitting: Physician Assistant

## 2023-07-04 ENCOUNTER — Telehealth: Payer: Self-pay | Admitting: Cardiology

## 2023-07-04 NOTE — Telephone Encounter (Signed)
 Patient c/o Palpitations: STAT if patient c/o lightheadedness, shortness of breath, or chest pain  How long have you had palpitations/irregular HR/ Afib? Are you having the symptoms now?  Afib on and off since Sunday, patient feels it today, but not as strong as yesterday  Are you currently experiencing lightheadedness, SOB or CP?  No   Do you have a history of afib (atrial fibrillation) or irregular heart rhythm?  Hx irregular HR  Have you checked your BP or HR? (document readings if available):  HR fluctuating, 41 lowest to the 140's, ranging 60-84 BPM today  Hasn't checked BP   Are you experiencing any other symptoms?  No

## 2023-07-04 NOTE — Telephone Encounter (Signed)
 Left voicemail to return call to office

## 2023-07-06 NOTE — Telephone Encounter (Signed)
 Returned call to patient.  She feels better today.  Sunday she felt she went into afib and Monday she still felt unwell.  She ran out of metoprolol so she started taking old 50 mg tablets.  It looks like today a refill prescription for the 100 mg tablets was sent.  I've made her aware of this.  She will pick up in time for this evening's dose.  No other needs at this time.

## 2023-07-20 NOTE — Progress Notes (Deleted)
  Electrophysiology Office Note:   Date:  07/20/2023  ID:  Sandra Stark, DOB [birth year], MRN [id number]  Primary Cardiologist: Will Jorja Loa, MD Electrophysiologist: Regan Lemming, MD  {Click to update primary MD,subspecialty MD or APP then REFRESH:1}    History of Present Illness:   Sandra Stark is a 58 y.o. female with h/o PAF, AFL, AT and HLD seen today for routine electrophysiology followup.   Since last being seen in our clinic the patient reports doing ***.  she denies chest pain, palpitations, dyspnea, PND, orthopnea, nausea, vomiting, dizziness, syncope, edema, weight gain, or early satiety.   Review of systems complete and found to be negative unless listed in HPI.   EP Information / Studies Reviewed:    EKG is ordered today. Personal review as below.       Arrhythmia/Device History No specialty comments available.   Physical Exam:   VS:  LMP 11/02/2019    Wt Readings from Last 3 Encounters:  12/26/22 299 lb 9.6 oz (135.9 kg)  07/06/22 (!) 315 lb (142.9 kg)  02/25/22 (!) 326 lb (147.9 kg)     GEN: No acute distress NECK: No JVD; No carotid bruits CARDIAC: {EPRHYTHM:28826}, no murmurs, rubs, gallops RESPIRATORY:  Clear to auscultation without rales, wheezing or rhonchi  ABDOMEN: Soft, non-tender, non-distended EXTREMITIES:  {EDEMA LEVEL:28147::"No"} edema; No deformity   ASSESSMENT AND PLAN:    Persistent Atrial Fibrillation Persistent Atrial Flutter Atrial tachycardia EKG today shows *** Continue Tikosyn 250 mcg BID Continue Toprol 100 mg BID Continue Eliquis 5 mg BID BMET and Mg today.    Obesity There is no height or weight on file to calculate BMI.  Encouraged lifestyle modification   {Click here to Review PMH, Prob List, Meds, Allergies, SHx, FHx  :1}   Follow up with {NWGNF:62130} {EPFOLLOW QM:57846}  Signed, Graciella Freer, PA-C

## 2023-07-21 ENCOUNTER — Ambulatory Visit: Payer: Self-pay | Attending: Internal Medicine | Admitting: Student

## 2023-07-21 DIAGNOSIS — Z79899 Other long term (current) drug therapy: Secondary | ICD-10-CM

## 2023-07-21 DIAGNOSIS — I4819 Other persistent atrial fibrillation: Secondary | ICD-10-CM

## 2023-07-21 DIAGNOSIS — I4892 Unspecified atrial flutter: Secondary | ICD-10-CM

## 2023-08-31 ENCOUNTER — Ambulatory Visit: Payer: Self-pay | Admitting: Cardiology

## 2023-09-05 DIAGNOSIS — I499 Cardiac arrhythmia, unspecified: Secondary | ICD-10-CM | POA: Insufficient documentation

## 2023-09-05 NOTE — Progress Notes (Signed)
 Cardiology Office Note:  .   Date:  09/06/2023  ID:  Sandra Stark, DOB 1966/04/13, MRN [id number] PCP: Abbe Hoard., MD  [facility] Providers Cardiologist:  Will Cortland Ding, MD Electrophysiologist:  Lei Pump, MD    History of Present Illness: Sandra Stark   Sandra Stark is a 58 y.o. female with a past medical history of atrial tachycardia, atrial flutter/fibrillation,history of uterine cancer, dyslipidemia, obesity.  11/15/2021 echo EF 60-65%, trivial MR 09/21/2021 dofetilide  admission 03/31/2020 DCCV--Flecainide  increased to 150 mg twice daily and then this ultimately had to be discontinued secondary to QRS widening 08/26/2019 DCCV 08/20/2019 DCCV  [facility] with [facility] in 2016 for evaluation of chest pain, she had had a stress evaluation that revealed anterolateral ischemia >> left heart cath revealed normal coronary arteries.  Had been maintained on flecainide  and metoprolol  for several years until 2021 when she had episode of A-fib with RVR, this was while she was been treating for URI with prednisone, she ultimately underwent a successful DCCV, however a week later required a repeat DCCV, and a third time in December that same year, her flecainide  was increased to 150 mg twice daily at that time she had widening of her QRS and this ultimately had to be discontinued.  She was admitted 09/21/2021 to 09/24/2021 for dofetilide  admission.  Most recently evaluated by Dr. Lawana Pray, maintaining sinus rhythm, QTc stable.  She presents today for follow up on her atrial fibrillation. She has been doing well from a cardiac perspective. She wears her apple watch and monitors her atrial fibrillation burden. She did have an episode a few weeks ago of atrial fibrillation that lasted ~ 6 hours, she took her metoprolol  tartrate x 2 and eventually returned to SR. Typically her AF burden is ~ 2% but has been 16%, but she is been in SR for the last few weeks. She was evaluated for OSA and  advised she did not have OSA. We discussed weight loss, she would be a good candidate for Ozempic, however is currently uninsured. She does have a GoodRx card that has been covering her medications well, so we will see if it would cover Ozempic to help with her DM and augment weight loss.  She denies chest pain, palpitations, dyspnea, pnd, orthopnea, n, v, dizziness, syncope, edema, weight gain, or early satiety.   ROS: Review of Systems  Cardiovascular:  Positive for palpitations.  All other systems reviewed and are negative.    Studies Reviewed: Sandra Stark   EKG Interpretation Date/Time:  Wednesday Sep 06 2023 15:22:55 EDT Ventricular Rate:  64 PR Interval:  154 QRS Duration:  80 QT Interval:  476 QTC Calculation: 491 R Axis:   43  Text Interpretation: Normal sinus rhythm Low voltage QRS Prolonged QT Abnormal ECG When compared with ECG of 26-Dec-2022 12:09, No significant change was found Confirmed by Pattricia Bores [phone]) on 09/06/2023 3:30:03 PM    Cardiac Studies & Procedures   ______________________________________________________________________________________________   STRESS TESTS  NM MYOCAR MULTI W/SPECT W 03/27/2013  Narrative CLINICAL DATA:  Chest pain.  Treadmill stress test.  EXAM: MYOCARDIAL IMAGING WITH SPECT (REST AND EXERCISE)  GATED LEFT VENTRICULAR WALL MOTION STUDY  LEFT VENTRICULAR EJECTION FRACTION  TECHNIQUE: Standard myocardial SPECT imaging was performed after resting intravenous injection of 10 mCi Tc-3m sestamibi. Subsequently, exercise tolerance test was performed by the patient under the supervision of the Cardiology staff. At peak-stress, 30 mCi Tc-36m sestamibi was injected intravenously and standard myocardial SPECT imaging was performed.  Quantitative gated imaging was also performed to evaluate left ventricular wall motion, and estimate left ventricular ejection fraction.  COMPARISON:  Chest CT 03/26/2013.  FINDINGS: There is a small area  of anterolateral wall reversible ischemia. This is in the mid anterior wall extending laterally. No fixed perfusion defects are identified. Wall motion appeared normal. Normal mural thickening was observed. Calculated ejection fraction was decreased at 51%. End-diastolic volume is 80 mL. End systolic volume was 39 mL.  IMPRESSION: Small area of mid anterolateral reversible ischemia. Calculated ejection fraction is 51%.   Electronically Signed By: Errol Heaps M.D. On: 03/27/2013 16:15   ECHOCARDIOGRAM  ECHOCARDIOGRAM COMPLETE 11/15/2021  Narrative ECHOCARDIOGRAM REPORT    Patient Name:   Sandra Stark Date of Exam: 11/15/2021 Medical Rec #:  [id number]       Height:       65.0 in Accession #:    [id number]      Weight:       329.8 lb Date of Birth:  19-Nov-1965       BSA:          2.447 m Patient Age:    56 years        BP:           128/90 mmHg Patient Gender: F               HR:           70 bpm. Exam Location:  Outpatient  Procedure: 2D Echo, Cardiac Doppler and Color Doppler  Indications:    A fib  History:        Patient has prior history of Echocardiogram examinations, most recent 08/20/2019. Signs/Symptoms:Chest Pain; Risk Factors:Dyslipidemia and Hypertension.  Sonographer:    Melissa Morford RDCS (AE, PE) Referring Phys: [id number] CLINT R FENTON   Sonographer Comments: Technically challenging study due to limited acoustic windows and patient is morbidly obese. IMPRESSIONS   1. Left ventricular ejection fraction, by estimation, is 60 to 65%. The left ventricle has normal function. Left ventricular endocardial border not optimally defined to evaluate regional wall motion. Left ventricular diastolic parameters were normal. 2. Right ventricular systolic function is normal. The right ventricular size is normal. Tricuspid regurgitation signal is inadequate for assessing PA pressure. 3. A small pericardial effusion is present. The pericardial effusion is  circumferential. 4. The mitral valve is grossly normal. Trivial mitral valve regurgitation. No evidence of mitral stenosis. 5. The aortic valve was not well visualized. Aortic valve regurgitation is not visualized. No aortic stenosis is present.  FINDINGS Left Ventricle: Left ventricular ejection fraction, by estimation, is 60 to 65%. The left ventricle has normal function. Left ventricular endocardial border not optimally defined to evaluate regional wall motion. The left ventricular internal cavity size was normal in size. There is no left ventricular hypertrophy. Left ventricular diastolic parameters were normal.  Right Ventricle: The right ventricular size is normal. No increase in right ventricular wall thickness. Right ventricular systolic function is normal. Tricuspid regurgitation signal is inadequate for assessing PA pressure.  Left Atrium: Left atrial size was normal in size.  Right Atrium: Right atrial size was normal in size.  Pericardium: A small pericardial effusion is present. The pericardial effusion is circumferential.  Mitral Valve: The mitral valve is grossly normal. Trivial mitral valve regurgitation. No evidence of mitral valve stenosis.  Tricuspid Valve: The tricuspid valve is grossly normal. Tricuspid valve regurgitation is not demonstrated. No evidence of tricuspid stenosis.  Aortic Valve: The aortic valve was  not well visualized. Aortic valve regurgitation is not visualized. No aortic stenosis is present.  Pulmonic Valve: The pulmonic valve was not well visualized. Pulmonic valve regurgitation is not visualized.  Aorta: The aortic root and ascending aorta are structurally normal, with no evidence of dilitation.  IAS/Shunts: The atrial septum is grossly normal.   LEFT VENTRICLE PLAX 2D LVIDd:         5.00 cm   Diastology LVIDs:         3.70 cm   LV e' medial:    9.46 cm/s LV PW:         0.80 cm   LV E/e' medial:  10.8 LV IVS:        0.80 cm   LV e' lateral:    9.79 cm/s LVOT diam:     2.00 cm   LV E/e' lateral: 10.4 LV SV:         81 LV SV Index:   33 LVOT Area:     3.14 cm   RIGHT VENTRICLE RV S prime:     12.70 cm/s TAPSE (M-mode): 2.1 cm  LEFT ATRIUM             Index        RIGHT ATRIUM           Index LA diam:        3.20 cm 1.31 cm/m   RA Area:     12.70 cm LA Vol (A2C):   64.0 ml 26.16 ml/m  RA Volume:   26.20 ml  10.71 ml/m LA Vol (A4C):   49.0 ml 20.03 ml/m LA Biplane Vol: 56.5 ml 23.09 ml/m AORTIC VALVE LVOT Vmax:   109.00 cm/s LVOT Vmean:  76.300 cm/s LVOT VTI:    0.257 m  AORTA Ao Root diam: 2.60 cm Ao Asc diam:  2.70 cm  MITRAL VALVE MV Area (PHT): 3.50 cm     SHUNTS MV Decel Time: 217 msec     Systemic VTI:  0.26 m MV E velocity: 102.00 cm/s  Systemic Diam: 2.00 cm MV A velocity: 93.40 cm/s MV E/A ratio:  1.09  Jackquelyn Mass MD Electronically signed by Jackquelyn Mass MD Signature Date/Time: 11/15/2021/3:30:19 PM    Final   TEE  ECHO TEE 08/20/2019  Narrative TRANSESOPHOGEAL ECHO REPORT    Patient Name:   Sandra Stark Date of Exam: 08/20/2019 Medical Rec #:  [id number]       Height:       65.0 in Accession #:    [id number]      Weight:       290.6 lb Date of Birth:  18-Jun-1965       BSA:          2.319 m Patient Age:    54 years        BP:           107/73 mmHg Patient Gender: F               HR:           146 bpm. Exam Location:  Inpatient  Procedure: Transesophageal Echo, Cardiac Doppler and Color Doppler  Indications:     Atrial fibrillation  History:         Patient has prior history of Echocardiogram examinations, most recent 03/29/2019. Arrythmias:Atrial Fibrillation.  Sonographer:     Cesario Collum Referring Phys:  [id number] CLINT R FENTON Diagnosing Phys: Alexandria Angel MD  PROCEDURE: The transesophogeal probe was passed without  difficulty through the esophogus of the patient. Sedation performed by performing physician. The patient was monitored while under deep sedation.  Anesthestetic sedation was provided intravenously by Anesthesiology: 204mg  of Propofol . The patient developed no complications during the procedure.  IMPRESSIONS   1. Normal LV systolic function; mild LAE; no LAA thrombus; mild MR and TR. 2. Left ventricular ejection fraction, by estimation, is 55 to 60%. The left ventricle has normal function. The left ventricle has no regional wall motion abnormalities. 3. Right ventricular systolic function is normal. The right ventricular size is normal. 4. Left atrial size was mildly dilated. No left atrial/left atrial appendage thrombus was detected. 5. The mitral valve is normal in structure. Mild mitral valve regurgitation. 6. The aortic valve is tricuspid. Aortic valve regurgitation is not visualized.  FINDINGS Left Ventricle: Left ventricular ejection fraction, by estimation, is 55 to 60%. The left ventricle has normal function. The left ventricle has no regional wall motion abnormalities. The left ventricular internal cavity size was normal in size. There is no left ventricular hypertrophy.  Right Ventricle: The right ventricular size is normal.Right ventricular systolic function is normal.  Left Atrium: Left atrial size was mildly dilated. No left atrial/left atrial appendage thrombus was detected.  Right Atrium: Right atrial size was normal in size.  Pericardium: There is no evidence of pericardial effusion.  Mitral Valve: The mitral valve is normal in structure. Mild mitral valve regurgitation.  Tricuspid Valve: The tricuspid valve is normal in structure. Tricuspid valve regurgitation is mild.  Aortic Valve: The aortic valve is tricuspid. Aortic valve regurgitation is not visualized.  Pulmonic Valve: The pulmonic valve was normal in structure. Pulmonic valve regurgitation is trivial.  Aorta: The aortic root is normal in size and structure. There is minimal (Grade I) plaque involving the descending aorta.  IAS/Shunts: No atrial level  shunt detected by color flow Doppler.  Additional Comments: Normal LV systolic function; mild LAE; no LAA thrombus; mild MR and TR.  Alexandria Angel MD Electronically signed by Alexandria Angel MD Signature Date/Time: 08/20/2019/1:46:43 PM    Final        ______________________________________________________________________________________________      Risk Assessment/Calculations:    CHA2DS2-VASc Score = 2   This indicates a 2.2% annual risk of stroke. The patient's score is based upon: CHF History: 0 HTN History: 0 Diabetes History: 1 Stroke History: 0 Vascular Disease History: 0 Age Score: 0 Gender Score: 1            Physical Exam:   VS:  BP 92/60   Pulse 64   Ht 5\' 5"  (1.651 m)   Wt 292 lb 12.8 oz (132.8 kg)   LMP 11/02/2019   SpO2 96%   BMI 48.72 kg/m    Wt Readings from Last 3 Encounters:  09/06/23 292 lb 12.8 oz (132.8 kg)  12/26/22 299 lb 9.6 oz (135.9 kg)  07/06/22 (!) 315 lb (142.9 kg)    GEN: Well nourished, well developed in no acute distress NECK: No JVD; No carotid bruits CARDIAC: RRR, no murmurs, rubs, gallops RESPIRATORY:  Clear to auscultation without rales, wheezing or rhonchi  ABDOMEN: Soft, non-tender, non-distended EXTREMITIES:  No edema; No deformity   ASSESSMENT AND PLAN: .   Paroxysmal atrial fibrillation/flutter/hypercoagulable state/high risk medication use-CHA2DS2-VASc score is 2, she is not currently anticoagulated. AF burden this month was 16% as she had an episode that lasted for 6 hours, typically she states it is 2%. EKG without changes, Qtc is stable. Will repeat BMET and magnesium .  She is aware she needs to lose weight to be considered for an ablation.   Obesity - BMI 48.7, she is uninsured, will try Ozempic to help with her DM2, she has a GoodRx card and I am not sure if it will cover enough but we will try to arrange. Heart healthy diet and regular cardiovascular exercise encouraged.    DM2 - currently on metformin, will  send in Ozempic.        Dispo: Ozempic 0.25 mg SQ every week x 4 weeks >> 0.5 mg SQ every week x 4 weeks >> 1 mg SQ every week x 4 weeks. BMP, Magnesium  level, follow up with Dr. Lawana Pray in  6 months.   Signed, Terrance Ferretti, NP

## 2023-09-06 ENCOUNTER — Ambulatory Visit: Payer: Self-pay | Attending: Cardiology | Admitting: Cardiology

## 2023-09-06 ENCOUNTER — Other Ambulatory Visit: Payer: Self-pay

## 2023-09-06 ENCOUNTER — Encounter: Payer: Self-pay | Admitting: Cardiology

## 2023-09-06 VITALS — BP 92/60 | HR 64 | Ht 65.0 in | Wt 292.8 lb

## 2023-09-06 DIAGNOSIS — I480 Paroxysmal atrial fibrillation: Secondary | ICD-10-CM

## 2023-09-06 DIAGNOSIS — Z79899 Other long term (current) drug therapy: Secondary | ICD-10-CM

## 2023-09-06 DIAGNOSIS — I4719 Other supraventricular tachycardia: Secondary | ICD-10-CM

## 2023-09-06 DIAGNOSIS — E1165 Type 2 diabetes mellitus with hyperglycemia: Secondary | ICD-10-CM

## 2023-09-06 MED ORDER — SEMAGLUTIDE(0.25 OR 0.5MG/DOS) 2 MG/3ML ~~LOC~~ SOPN: PEN_INJECTOR | SUBCUTANEOUS | 0 refills | Status: AC

## 2023-09-06 MED ORDER — METOPROLOL TARTRATE 50 MG PO TABS: 50.0000 mg | ORAL_TABLET | Freq: Every day | ORAL | 1 refills | Status: AC

## 2023-09-06 NOTE — Patient Instructions (Signed)
 Medication Instructions:  Your physician recommends that you continue on your current medications as directed. Please refer to the Current Medication list given to you today.  *If you need a refill on your cardiac medications before your next appointment, please call your pharmacy*   Lab Work: Your physician recommends that you have a CMP and magnesium  today in the office.  If you have labs (blood work) drawn today and your tests are completely normal, you will receive your results only by: MyChart Message (if you have MyChart) OR A paper copy in the mail If you have any lab test that is abnormal or we need to change your treatment, we will call you to review the results.   Testing/Procedures: None ordered   Follow-Up: At [facility], you and your health needs are our priority.  As part of our continuing mission to provide you with exceptional heart care, we have created designated Provider Care Teams.  These Care Teams include your primary Cardiologist (physician) and Advanced Practice Providers (APPs -  Physician Assistants and Nurse Practitioners) who all work together to provide you with the care you need, when you need it.  We recommend signing up for the patient portal called "MyChart".  Sign up information is provided on this After Visit Summary.  MyChart is used to connect with patients for Virtual Visits (Telemedicine).  Patients are able to view lab/test results, encounter notes, upcoming appointments, etc.  Non-urgent messages can be sent to your provider as well.   To learn more about what you can do with MyChart, go to [email or web address].    Your next appointment:   6 month(s)  The format for your next appointment:   In Person  Provider:   Agatha Horsfall, MD    Other Instructions none  Important Information About Sugar

## 2023-09-07 ENCOUNTER — Ambulatory Visit: Payer: Self-pay | Admitting: Cardiology

## 2023-09-07 LAB — MAGNESIUM: Magnesium: 2 mg/dL (ref 1.6–2.3)

## 2023-09-07 LAB — COMPREHENSIVE METABOLIC PANEL WITH GFR
ALT: 28 IU/L (ref 0–32)
AST: 54 IU/L — ABNORMAL HIGH (ref 0–40)
Albumin: 4.1 g/dL (ref 3.8–4.9)
Alkaline Phosphatase: 159 IU/L — ABNORMAL HIGH (ref 44–121)
BUN/Creatinine Ratio: 14 (ref 9–23)
BUN: 11 mg/dL (ref 6–24)
Bilirubin Total: 0.7 mg/dL (ref 0.0–1.2)
CO2: 21 mmol/L (ref 20–29)
Calcium: 8.8 mg/dL (ref 8.7–10.2)
Chloride: 102 mmol/L (ref 96–106)
Creatinine, Ser: 0.8 mg/dL (ref 0.57–1.00)
Globulin, Total: 3.2 g/dL (ref 1.5–4.5)
Glucose: 98 mg/dL (ref 70–99)
Potassium: 4.4 mmol/L (ref 3.5–5.2)
Sodium: 140 mmol/L (ref 134–144)
Total Protein: 7.3 g/dL (ref 6.0–8.5)
eGFR: 85 mL/min/1.73

## 2023-10-03 ENCOUNTER — Other Ambulatory Visit: Payer: Self-pay

## 2023-10-03 MED ORDER — DOFETILIDE 250 MCG PO CAPS: 250.0000 ug | ORAL_CAPSULE | Freq: Two times a day (BID) | ORAL | 3 refills | Status: AC

## 2023-10-03 NOTE — Telephone Encounter (Signed)
 Pt's medication was sent to pt's pharmacy as requested. Confirmation received.

## 2023-11-06 ENCOUNTER — Other Ambulatory Visit: Payer: Self-pay | Admitting: *Deleted

## 2023-11-06 MED ORDER — METOPROLOL SUCCINATE ER 100 MG PO TB24: 100.0000 mg | ORAL_TABLET | Freq: Two times a day (BID) | ORAL | 3 refills | Status: AC

## 2023-12-28 ENCOUNTER — Other Ambulatory Visit: Payer: Self-pay | Admitting: Cardiology

## 2024-03-28 ENCOUNTER — Other Ambulatory Visit: Payer: Self-pay | Admitting: Cardiology
